# Patient Record
Sex: Male | Born: 1945 | Race: White | Hispanic: No | Smoking: Former smoker
Health system: Southern US, Community
[De-identification: ages and names within clinical notes are randomized; demographics above are authoritative.]

## PROBLEM LIST (undated history)

## (undated) DIAGNOSIS — C3490 Malignant neoplasm of unspecified part of unspecified bronchus or lung: Secondary | ICD-10-CM

## (undated) DIAGNOSIS — R32 Unspecified urinary incontinence: Secondary | ICD-10-CM

## (undated) DIAGNOSIS — I639 Cerebral infarction, unspecified: Secondary | ICD-10-CM

## (undated) DIAGNOSIS — I10 Essential (primary) hypertension: Secondary | ICD-10-CM

## (undated) DIAGNOSIS — R451 Restlessness and agitation: Secondary | ICD-10-CM

## (undated) DIAGNOSIS — T8859XA Other complications of anesthesia, initial encounter: Secondary | ICD-10-CM

## (undated) DIAGNOSIS — F17200 Nicotine dependence, unspecified, uncomplicated: Secondary | ICD-10-CM

## (undated) DIAGNOSIS — F0390 Unspecified dementia without behavioral disturbance: Secondary | ICD-10-CM

## (undated) DIAGNOSIS — T4145XA Adverse effect of unspecified anesthetic, initial encounter: Secondary | ICD-10-CM

## (undated) DIAGNOSIS — E785 Hyperlipidemia, unspecified: Secondary | ICD-10-CM

## (undated) HISTORY — DX: Restlessness and agitation: R45.1

## (undated) HISTORY — DX: Hyperlipidemia, unspecified: E78.5

## (undated) HISTORY — DX: Cerebral infarction, unspecified: I63.9

## (undated) HISTORY — DX: Malignant neoplasm of unspecified part of unspecified bronchus or lung: C34.90

## (undated) HISTORY — DX: Unspecified dementia without behavioral disturbance: F03.90

## (undated) HISTORY — DX: Essential (primary) hypertension: I10

## (undated) HISTORY — DX: Unspecified urinary incontinence: R32

---

## 2015-01-20 DIAGNOSIS — Z23 Encounter for immunization: Secondary | ICD-10-CM | POA: Diagnosis not present

## 2015-01-27 DIAGNOSIS — Z23 Encounter for immunization: Secondary | ICD-10-CM | POA: Diagnosis not present

## 2015-05-21 ENCOUNTER — Emergency Department (HOSPITAL_COMMUNITY): Payer: Medicare Other

## 2015-05-21 ENCOUNTER — Inpatient Hospital Stay (HOSPITAL_COMMUNITY)
Admission: EM | Admit: 2015-05-21 | Discharge: 2015-05-24 | DRG: 064 | Disposition: A | Discharge status: Home Health | Payer: Medicare Other | Attending: Internal Medicine | Admitting: Internal Medicine

## 2015-05-21 ENCOUNTER — Encounter (HOSPITAL_COMMUNITY): Payer: Self-pay | Admitting: *Deleted

## 2015-05-21 ENCOUNTER — Observation Stay (HOSPITAL_COMMUNITY): Payer: Medicare Other

## 2015-05-21 DIAGNOSIS — Z7982 Long term (current) use of aspirin: Secondary | ICD-10-CM

## 2015-05-21 DIAGNOSIS — G8191 Hemiplegia, unspecified affecting right dominant side: Secondary | ICD-10-CM | POA: Diagnosis not present

## 2015-05-21 DIAGNOSIS — R1310 Dysphagia, unspecified: Secondary | ICD-10-CM | POA: Diagnosis not present

## 2015-05-21 DIAGNOSIS — Z823 Family history of stroke: Secondary | ICD-10-CM

## 2015-05-21 DIAGNOSIS — Z8249 Family history of ischemic heart disease and other diseases of the circulatory system: Secondary | ICD-10-CM

## 2015-05-21 DIAGNOSIS — R29810 Facial weakness: Secondary | ICD-10-CM | POA: Diagnosis not present

## 2015-05-21 DIAGNOSIS — I69320 Aphasia following cerebral infarction: Secondary | ICD-10-CM | POA: Insufficient documentation

## 2015-05-21 DIAGNOSIS — G9341 Metabolic encephalopathy: Secondary | ICD-10-CM | POA: Diagnosis present

## 2015-05-21 DIAGNOSIS — R4781 Slurred speech: Secondary | ICD-10-CM | POA: Diagnosis not present

## 2015-05-21 DIAGNOSIS — I69359 Hemiplegia and hemiparesis following cerebral infarction affecting unspecified side: Secondary | ICD-10-CM

## 2015-05-21 DIAGNOSIS — R29711 NIHSS score 11: Secondary | ICD-10-CM | POA: Diagnosis present

## 2015-05-21 DIAGNOSIS — H0260 Xanthelasma of unspecified eye, unspecified eyelid: Secondary | ICD-10-CM | POA: Diagnosis present

## 2015-05-21 DIAGNOSIS — I63312 Cerebral infarction due to thrombosis of left middle cerebral artery: Secondary | ICD-10-CM | POA: Diagnosis not present

## 2015-05-21 DIAGNOSIS — R7303 Prediabetes: Secondary | ICD-10-CM | POA: Diagnosis present

## 2015-05-21 DIAGNOSIS — I639 Cerebral infarction, unspecified: Secondary | ICD-10-CM | POA: Diagnosis not present

## 2015-05-21 DIAGNOSIS — B9620 Unspecified Escherichia coli [E. coli] as the cause of diseases classified elsewhere: Secondary | ICD-10-CM | POA: Diagnosis present

## 2015-05-21 DIAGNOSIS — I69391 Dysphagia following cerebral infarction: Secondary | ICD-10-CM

## 2015-05-21 DIAGNOSIS — Z6835 Body mass index (BMI) 35.0-35.9, adult: Secondary | ICD-10-CM

## 2015-05-21 DIAGNOSIS — R4701 Aphasia: Secondary | ICD-10-CM | POA: Diagnosis not present

## 2015-05-21 DIAGNOSIS — M6281 Muscle weakness (generalized): Secondary | ICD-10-CM | POA: Diagnosis not present

## 2015-05-21 DIAGNOSIS — Z720 Tobacco use: Secondary | ICD-10-CM | POA: Insufficient documentation

## 2015-05-21 DIAGNOSIS — Z716 Tobacco abuse counseling: Secondary | ICD-10-CM

## 2015-05-21 DIAGNOSIS — Z833 Family history of diabetes mellitus: Secondary | ICD-10-CM

## 2015-05-21 DIAGNOSIS — I10 Essential (primary) hypertension: Secondary | ICD-10-CM | POA: Insufficient documentation

## 2015-05-21 DIAGNOSIS — F17210 Nicotine dependence, cigarettes, uncomplicated: Secondary | ICD-10-CM | POA: Diagnosis present

## 2015-05-21 DIAGNOSIS — I6523 Occlusion and stenosis of bilateral carotid arteries: Secondary | ICD-10-CM | POA: Diagnosis not present

## 2015-05-21 DIAGNOSIS — E785 Hyperlipidemia, unspecified: Secondary | ICD-10-CM | POA: Insufficient documentation

## 2015-05-21 DIAGNOSIS — R4182 Altered mental status, unspecified: Secondary | ICD-10-CM | POA: Diagnosis not present

## 2015-05-21 DIAGNOSIS — N390 Urinary tract infection, site not specified: Secondary | ICD-10-CM | POA: Insufficient documentation

## 2015-05-21 HISTORY — DX: Nicotine dependence, unspecified, uncomplicated: F17.200

## 2015-05-21 LAB — URINE MICROSCOPIC-ADD ON

## 2015-05-21 LAB — COMPREHENSIVE METABOLIC PANEL
ALBUMIN: 3.8 g/dL (ref 3.5–5.0)
ALT: 16 U/L — ABNORMAL LOW (ref 17–63)
ANION GAP: 13 (ref 5–15)
AST: 17 U/L (ref 15–41)
Alkaline Phosphatase: 56 U/L (ref 38–126)
BILIRUBIN TOTAL: 0.6 mg/dL (ref 0.3–1.2)
BUN: 14 mg/dL (ref 6–20)
CO2: 22 mmol/L (ref 22–32)
Calcium: 9.4 mg/dL (ref 8.9–10.3)
Chloride: 108 mmol/L (ref 101–111)
Creatinine, Ser: 1.06 mg/dL (ref 0.61–1.24)
GFR calc Af Amer: >60 mL/min (ref >60)
GLUCOSE: 118 mg/dL — AB (ref 65–99)
POTASSIUM: 4.2 mmol/L (ref 3.5–5.1)
Sodium: 143 mmol/L (ref 135–145)
TOTAL PROTEIN: 6.5 g/dL (ref 6.5–8.1)

## 2015-05-21 LAB — URINALYSIS, ROUTINE W REFLEX MICROSCOPIC
Bilirubin Urine: NEGATIVE
GLUCOSE, UA: NEGATIVE mg/dL
Ketones, ur: NEGATIVE mg/dL
Nitrite: POSITIVE — AB
PH: 6 (ref 5.0–8.0)
Protein, ur: NEGATIVE mg/dL
Specific Gravity, Urine: 1.005 (ref 1.005–1.030)

## 2015-05-21 LAB — DIFFERENTIAL
BASOS PCT: 0 %
Basophils Absolute: 0 10*3/uL (ref 0.0–0.1)
EOS ABS: 0.4 10*3/uL (ref 0.0–0.7)
EOS PCT: 4 %
LYMPHS ABS: 3.3 10*3/uL (ref 0.7–4.0)
Lymphocytes Relative: 35 %
Monocytes Absolute: 0.5 10*3/uL (ref 0.1–1.0)
Monocytes Relative: 5 %
NEUTROS PCT: 54 %
Neutro Abs: 5 10*3/uL (ref 1.7–7.7)

## 2015-05-21 LAB — RAPID URINE DRUG SCREEN, HOSP PERFORMED
Amphetamines: NOT DETECTED
Barbiturates: NOT DETECTED
Benzodiazepines: NOT DETECTED
COCAINE: NOT DETECTED
OPIATES: NOT DETECTED
Tetrahydrocannabinol: NOT DETECTED

## 2015-05-21 LAB — I-STAT CHEM 8, ED
BUN: 17 mg/dL (ref 6–20)
Calcium, Ion: 1.17 mmol/L (ref 1.13–1.30)
Chloride: 108 mmol/L (ref 101–111)
Creatinine, Ser: 0.9 mg/dL (ref 0.61–1.24)
Glucose, Bld: 115 mg/dL — ABNORMAL HIGH (ref 65–99)
HEMATOCRIT: 52 % (ref 39.0–52.0)
HEMOGLOBIN: 17.7 g/dL — AB (ref 13.0–17.0)
POTASSIUM: 4.2 mmol/L (ref 3.5–5.1)
SODIUM: 143 mmol/L (ref 135–145)
TCO2: 21 mmol/L (ref 0–100)

## 2015-05-21 LAB — CBC
HCT: 49.1 % (ref 39.0–52.0)
HEMOGLOBIN: 16.4 g/dL (ref 13.0–17.0)
MCH: 30.5 pg (ref 26.0–34.0)
MCHC: 33.4 g/dL (ref 30.0–36.0)
MCV: 91.3 fL (ref 78.0–100.0)
PLATELETS: 181 10*3/uL (ref 150–400)
RBC: 5.38 MIL/uL (ref 4.22–5.81)
RDW: 13.9 % (ref 11.5–15.5)
WBC: 9.3 10*3/uL (ref 4.0–10.5)

## 2015-05-21 LAB — APTT: aPTT: 28 seconds (ref 24–37)

## 2015-05-21 LAB — PROTIME-INR
INR: 1.05 (ref 0.00–1.49)
PROTHROMBIN TIME: 13.9 s (ref 11.6–15.2)

## 2015-05-21 LAB — ETHANOL

## 2015-05-21 LAB — GLUCOSE, CAPILLARY
GLUCOSE-CAPILLARY: 87 mg/dL (ref 65–99)
Glucose-Capillary: 81 mg/dL (ref 65–99)

## 2015-05-21 LAB — I-STAT TROPONIN, ED: TROPONIN I, POC: 0 ng/mL (ref 0.00–0.08)

## 2015-05-21 MED ORDER — ASPIRIN 300 MG RE SUPP: 300.0000 mg | Freq: Every day | RECTAL | Status: DC

## 2015-05-21 MED ORDER — ASPIRIN 325 MG PO TABS
325.0000 mg | ORAL_TABLET | Freq: Every day | ORAL | Status: DC
  Administered 2015-05-22 – 2015-05-24 (×3): 325 mg via ORAL
  Filled 2015-05-21 (×3): qty 1

## 2015-05-21 MED ORDER — ENOXAPARIN SODIUM 40 MG/0.4ML ~~LOC~~ SOLN
40.0000 mg | SUBCUTANEOUS | Status: DC
  Administered 2015-05-21 – 2015-05-23 (×3): 40 mg via SUBCUTANEOUS
  Filled 2015-05-21 (×3): qty 0.4

## 2015-05-21 MED ORDER — DEXTROSE-NACL 5-0.45 % IV SOLN
INTRAVENOUS | Status: AC
  Administered 2015-05-21: 17:00:00 via INTRAVENOUS

## 2015-05-21 MED ORDER — IOHEXOL 350 MG/ML SOLN
80.0000 mL | Freq: Once | INTRAVENOUS | Status: AC | PRN
  Administered 2015-05-21: 80 mL via INTRAVENOUS

## 2015-05-21 MED ORDER — STROKE: EARLY STAGES OF RECOVERY BOOK
Freq: Once | Status: AC
  Administered 2015-05-21: 15:00:00
  Filled 2015-05-21: qty 1

## 2015-05-21 MED ORDER — CETYLPYRIDINIUM CHLORIDE 0.05 % MT LIQD
7.0000 mL | Freq: Two times a day (BID) | OROMUCOSAL | Status: DC
  Administered 2015-05-21 – 2015-05-24 (×6): 7 mL via OROMUCOSAL

## 2015-05-21 MED ORDER — NICOTINE 21 MG/24HR TD PT24
21.0000 mg | MEDICATED_PATCH | Freq: Every day | TRANSDERMAL | Status: DC
  Administered 2015-05-21 – 2015-05-24 (×4): 21 mg via TRANSDERMAL
  Filled 2015-05-21 (×4): qty 1

## 2015-05-21 MED ORDER — CHLORHEXIDINE GLUCONATE 0.12 % MT SOLN
15.0000 mL | Freq: Two times a day (BID) | OROMUCOSAL | Status: DC
  Administered 2015-05-21 – 2015-05-24 (×6): 15 mL via OROMUCOSAL
  Filled 2015-05-21 (×6): qty 15

## 2015-05-21 NOTE — ED Notes (Signed)
Deferred code stroke consult. LSN yesterday.

## 2015-05-21 NOTE — ED Notes (Signed)
Pt arrives from home via [employer]. Pt was LSN yesterday at 1630. Pt is able to follow only simple commands. Pt is having expressive aphasia, right sided weakness with a right sided facial droop. Pt seems confused and is fidgety.

## 2015-05-21 NOTE — Consult Note (Signed)
Requesting Physician: Dr. Dayna Barker    Chief Complaint: CVA  HPI:                                                                                                                                         Jonathan Bowen is an 70 y.o. male who does smoke "alot per family" but has not seen a MD and thus has no PMHx. Per family cholesterol and DM are in the family. Yesterday he was noted to not speak to the son and seemed not to be able to recognize him. This AM daughter noted he again was not speaking and seemed confused. Due to her noting a facial droop and some weakness on the right side he was brought to ED. CT head confirmed a left insular cortex wedge shape hypodensity. Currently he is both expressive and receptive aphasic. HE has a right facial droop and right sided weakness.  Per family he did take a ASA daily.  Date last known well: 2.2.2017 Time last known well:Time: 16:30 tPA Given: no out of window     Past Medical History  Diagnosis Date  . Smoker     History reviewed. No pertinent past surgical history.  Family History  Problem Relation Age of Onset  . Diabetes Mother   . Diabetes Father   . Hypertension Mother   . Hypertension Father   . Hyperlipidemia Father   . Hyperlipidemia Mother    Social History:  reports that he has been smoking.  He does not have any smokeless tobacco history on file. He reports that he does not drink alcohol or use illicit drugs.  Allergies: No Known Allergies  Medications:                                                                                                                           No current facility-administered medications for this encounter.   Current Outpatient Prescriptions  Medication Sig Dispense Refill  . aspirin EC 81 MG tablet Take 81 mg by mouth daily.       ROS:  History obtained from  family  General ROS: negative for - chills, fatigue, fever, night sweats, weight gain or weight loss Psychological ROS: negative for - behavioral disorder, hallucinations, memory difficulties, mood swings or suicidal ideation Ophthalmic ROS: negative for - blurry vision, double vision, eye pain or loss of vision ENT ROS: negative for - epistaxis, nasal discharge, oral lesions, sore throat, tinnitus or vertigo Allergy and Immunology ROS: negative for - hives or itchy/watery eyes Hematological and Lymphatic ROS: negative for - bleeding problems, bruising or swollen lymph nodes Endocrine ROS: negative for - galactorrhea, hair pattern changes, polydipsia/polyuria or temperature intolerance Respiratory ROS: negative for - cough, hemoptysis, shortness of breath or wheezing Cardiovascular ROS: negative for - chest pain, dyspnea on exertion, edema or irregular heartbeat Gastrointestinal ROS: negative for - abdominal pain, diarrhea, hematemesis, nausea/vomiting or stool incontinence Genito-Urinary ROS: negative for - dysuria, hematuria, incontinence or urinary frequency/urgency Musculoskeletal ROS: negative for - joint swelling or muscular weakness Neurological ROS: as noted in HPI Dermatological ROS: negative for rash and skin lesion changes  Neurologic Examination:                                                                                                      Blood pressure 196/76, pulse 71, temperature 98.3 F (36.8 C), temperature source Oral, resp. rate 16, height '5\' 6"'$  (1.676 m), weight 113.399 kg (250 lb), SpO2 90 %.  HEENT-  Normocephalic, no lesions, without obvious abnormality.  Normal external eye and conjunctiva.  Normal TM's bilaterally.  Normal auditory canals and external ears. Normal external nose, mucus membranes and septum.  Normal pharynx. Cardiovascular- S1, S2 normal, pulses palpable throughout   Lungs- chest clear, no wheezing, rales, normal symmetric air entry Abdomen-  normal findings: bowel sounds normal Extremities- no edema Lymph-no adenopathy palpable Musculoskeletal-no joint tenderness, deformity or swelling Skin-warm and dry, no hyperpigmentation, vitiligo, or suspicious lesions  Neurological Examination Mental Status: Alert, receptive and expressive aphasia. Has difficulty mimicking visual commands.  Cranial Nerves: II: Discs flat bilaterally; Visual fields grossly normal, pupils equal, round, reactive to light and accommodation III,IV, VI: ptosis not present, extra-ocular motions intact bilaterally V,VII: smile asymmetric on the right no over decreased sensation VIII: hearing normal bilaterally IX,X: uvula rises symmetrically XI: bilateral shoulder shrug XII: midline tongue extension Motor: Right : Upper extremity   4/5    Left:     Upper extremity   5/5  Lower extremity   4/5     Lower extremity   5/5 Tone and bulk:normal tone throughout; no atrophy noted Sensory: withdraws to pain bilaterally Deep Tendon Reflexes: 2+ and symmetric throughout Plantars: Right: downgoing   Left: downgoing Cerebellar: normal finger-to-nose, Gait: not tested       Lab Results: Basic Metabolic Panel:  Recent Labs Lab 05/21/15 0918 05/21/15 0928  NA 143 143  K 4.2 4.2  CL 108 108  CO2 22  --   GLUCOSE 118* 115*  BUN 14 17  CREATININE 1.06 0.90  CALCIUM 9.4  --     Liver Function Tests:  Recent Labs Lab 05/21/15 [phone]  AST 17  ALT 16*  ALKPHOS 56  BILITOT 0.6  PROT 6.5  ALBUMIN 3.8   No results for input(s): LIPASE, AMYLASE in the last 168 hours. No results for input(s): AMMONIA in the last 168 hours.  CBC:  Recent Labs Lab 05/21/15 0918 05/21/15 0928  WBC 9.3  --   NEUTROABS 5.0  --   HGB 16.4 17.7*  HCT 49.1 52.0  MCV 91.3  --   PLT 181  --     Cardiac Enzymes: No results for input(s): CKTOTAL, CKMB, CKMBINDEX, TROPONINI in the last 168 hours.  Lipid Panel: No results for input(s): CHOL, TRIG, HDL, CHOLHDL, VLDL,  LDLCALC in the last 168 hours.  CBG: No results for input(s): GLUCAP in the last 168 hours.  Microbiology: No results found for this or any previous visit.  Coagulation Studies:  Recent Labs  05/21/15 0918  LABPROT 13.9  INR 1.05    Imaging: Ct Head Wo Contrast  05/21/2015  CLINICAL DATA:  Altered mental status, nonverbal EXAM: CT HEAD WITHOUT CONTRAST TECHNIQUE: Contiguous axial images were obtained from the base of the skull through the vertex without contrast. COMPARISON:  None FINDINGS: Ill-defined wedge-shaped hypodensity involving the left insular cortex, external capsule, basal ganglia, and posterior limb of the left internal capsule. This extends into the coronal radiata/periventricular white matter, image 18. Appearance is compatible with an acute to subacute infarct. No associated hemorrhage, significant mass effect or edema. No midline shift. Ventricles remain symmetric. No hydrocephalus. Diffuse age-related brain atrophy. No extra-axial fluid collection. Cisterns remain patent. Cerebellar atrophy as well. Orbits are symmetric. Skull appears intact. Mastoids are clear. Retention cyst in the left maxillary sinus inferiorly measuring 15 mm. Similar small retention cyst in the left frontal sinus. Other sinuses remain clear. IMPRESSION: Wedge-shaped hypodensity involving the left insular cortex, external capsule, adjacent basal ganglia, and posterior limb of the internal capsule extending into the deep white matter compatible with an acute to subacute infarct. No associated intracranial hemorrhage, mass effect, or midline shift. Age related brain atrophy These results were called by telephone at the time of interpretation on 05/21/2015 at 10:33 am to [facility], [facility] , who verbally acknowledged these results. Electronically Signed   By: Jerilynn Mages.  Shick M.D.   On: 05/21/2015 10:35    Assessment and plan discussed with with attending physician and they are in agreement.    Etta Quill PA-C [facility] [phone]  05/21/2015, 11:43 AM   Assessment: 70 y.o. male with subacute infarct, possibly embolic or large vessel athero. He is being admitted for further evaluation.   Stroke Risk Factors - smoking  1. HgbA1c, fasting lipid panel 2. MRI of the brain without contrast 3. Frequent neuro checks 4. Echocardiogram 5. CT angio head and neck.  6. Prophylactic therapy-Antiplatelet med: Aspirin - dose '325mg'$  PO or '300mg'$  PR 7. Risk factor modification 8. Telemetry monitoring 9. PT consult, OT consult, Speech consult 10. please page stroke NP  Or  PA  Or MD  M-F from 8am -4 pm starting 05/22/15 as this patient will be followed by the stroke team at this point.   You can look them up on [email or web address]  Password [email or web address]  Roland Rack, MD [facility] [phone]  If 7pm- 7am, please page neurology on call as listed in [place].

## 2015-05-21 NOTE — Progress Notes (Signed)
Patient's BP 195/74 and rechecked with a result of 196/70.  MD notified and stated hypertention is permitted with stroke protocol unless SBP is greater than 220.

## 2015-05-21 NOTE — H&P (Signed)
Date: 05/21/2015               Patient Name:  Jonathan Bowen MRN: [id number]  DOB: [birth year] Age / Sex: 70 y.o., male   PCP: No primary care provider on file.         Medical Service: Internal Medicine Teaching Service         Attending Physician: Dr. Oval Linsey, MD    First Contact: Dr. Jule Ser Pager: [phone]  Second Contact: Dr. Charlott Rakes Pager: [phone]       After Hours (After 5p/  First Contact Pager: [phone]  weekends / holidays): Second Contact Pager: [phone]   Chief Complaint: stroke  History of Present Illness: Mr. Maxie Slovacek is a 70 y.o. man with past medical history of tobacco abuse who presented to the emergency department due to altered mental status.  Patient was unable to contribute to the HPI due to his current medical condition, but the patients daughter was present and able to provide the history.  Patient currently with expressive and receptive aphasia that daughter reports seems improved.  He is unable to provide an answer to orientation questions, unable to comprehend how many quarters are in a dollar, but he is able to repeat back "no if's and or but's" without slurred speech.  Daughter reports that her father was last seen normal last night by patients son, with whom he lives.  Apparently, patient was unable to recognize son and was not speaking as much.  This morning, patient did not answer his son when spoken to.  When daughter arrived to the house, she noticed patient was not speaking sensically, his speech was slurred, he was confused and crying, was sitting naked on the bed, and had a right facial droop with right sided weakness.  She then part him to the Emergency Department for further evaluation.  Patient takes no daily medications except Aspirin '81mg'$  for primary prevention.  Past Medical History: 150-year smoking history (3 PPD x 50 years), no other past history as patient does not have PCP and has not been seen by doctor in years  Social  History: smokes 3 PPD x 50 years, no EtOH, no ellicit drugs, divorced, lives with adult son, has 2 adult children, retired Electrical engineer, poor diet per daughter  Family History: significant for HTN, DM, HLD, CVA.    Meds: No current facility-administered medications for this encounter.   Current Outpatient Prescriptions  Medication Sig Dispense Refill  . aspirin EC 81 MG tablet Take 81 mg by mouth daily.      Allergies: Allergies as of 05/21/2015  . (No Known Allergies)   Past Medical History  Diagnosis Date  . Smoker    History reviewed. No pertinent past surgical history. Family History  Problem Relation Age of Onset  . Diabetes Mother   . Diabetes Father   . Hypertension Mother   . Hypertension Father   . Hyperlipidemia Father   . Hyperlipidemia Mother    Social History   Social History  . Marital Status: Unknown    Spouse Name: N/A  . Number of Children: N/A  . Years of Education: N/A   Occupational History  . Not on file.   Social History Main Topics  . Smoking status: Current Every Day Smoker -- 3.00 packs/day  . Smokeless tobacco: Not on file  . Alcohol Use: No  . Drug Use: No  . Sexual Activity: Not Currently   Other Topics Concern  . Not on file  Social History Narrative  . No narrative on file    Review of Systems: Review of Systems  Unable to perform ROS: medical condition     Physical Exam: Blood pressure 188/77, pulse 71, temperature 98.3 F (36.8 C), temperature source Oral, resp. rate 20, height '5\' 6"'$  (1.676 m), weight 250 lb (113.399 kg), SpO2 99 %. Physical Exam  Constitutional:  Moderately obese male, lying in bed, no distress  HENT:  Head: Normocephalic and atraumatic.  Eyes: EOM are normal.  Xanthelasma present  Neck: Normal range of motion.  Cardiovascular: Normal rate, regular rhythm, normal heart sounds and intact distal pulses.   No murmur heard. Pulmonary/Chest: Effort normal. He has wheezes.  Abdominal:  Soft. Bowel sounds are normal. He exhibits no distension. There is no tenderness.  Neurological:  RIGHT facial droop, expressive and receptive aphasia. 4/5 RUE strength, 5/5 LUE strength. 4/5 RLE strength, 5/5 LLE strength. Normal finger-to-nose, normal heel-to-shin testing. CN II-XII grossly intact although has some difficulty following commands due to comprehension.  Skin:  Multiple excoriations of bilateral upper extremities that family reports are from working outside     Lab results: Basic Metabolic Panel:  Recent Labs  05/21/15 0918 05/21/15 0928  NA 143 143  K 4.2 4.2  CL 108 108  CO2 22  --   GLUCOSE 118* 115*  BUN 14 17  CREATININE 1.06 0.90  CALCIUM 9.4  --    Liver Function Tests:  Recent Labs  05/21/15 0918  AST 17  ALT 16*  ALKPHOS 56  BILITOT 0.6  PROT 6.5  ALBUMIN 3.8   No results for input(s): LIPASE, AMYLASE in the last 72 hours. No results for input(s): AMMONIA in the last 72 hours. CBC:  Recent Labs  05/21/15 0918 05/21/15 0928  WBC 9.3  --   NEUTROABS 5.0  --   HGB 16.4 17.7*  HCT 49.1 52.0  MCV 91.3  --   PLT 181  --    Cardiac Enzymes: No results for input(s): CKTOTAL, CKMB, CKMBINDEX, TROPONINI in the last 72 hours. BNP: No results for input(s): PROBNP in the last 72 hours. D-Dimer: No results for input(s): DDIMER in the last 72 hours. CBG: No results for input(s): GLUCAP in the last 72 hours. Hemoglobin A1C: No results for input(s): HGBA1C in the last 72 hours. Fasting Lipid Panel: No results for input(s): CHOL, HDL, LDLCALC, TRIG, CHOLHDL, LDLDIRECT in the last 72 hours. Thyroid Function Tests: No results for input(s): TSH, T4TOTAL, FREET4, T3FREE, THYROIDAB in the last 72 hours. Anemia Panel: No results for input(s): VITAMINB12, FOLATE, FERRITIN, TIBC, IRON, RETICCTPCT in the last 72 hours. Coagulation:  Recent Labs  05/21/15 0918  LABPROT 13.9  INR 1.05   Urine Drug Screen: Drugs of Abuse  No results found  for: LABOPIA, COCAINSCRNUR, LABBENZ, AMPHETMU, THCU, LABBARB  Alcohol Level:  Recent Labs  05/21/15 1002  ETH <5   Urinalysis: No results for input(s): COLORURINE, LABSPEC, PHURINE, GLUCOSEU, HGBUR, BILIRUBINUR, KETONESUR, PROTEINUR, UROBILINOGEN, NITRITE, LEUKOCYTESUR in the last 72 hours.  Invalid input(s): APPERANCEUR  Imaging results:  Ct Head Wo Contrast  05/21/2015  CLINICAL DATA:  Altered mental status, nonverbal EXAM: CT HEAD WITHOUT CONTRAST TECHNIQUE: Contiguous axial images were obtained from the base of the skull through the vertex without contrast. COMPARISON:  None FINDINGS: Ill-defined wedge-shaped hypodensity involving the left insular cortex, external capsule, basal ganglia, and posterior limb of the left internal capsule. This extends into the coronal radiata/periventricular white matter, image 18. Appearance is compatible with  an acute to subacute infarct. No associated hemorrhage, significant mass effect or edema. No midline shift. Ventricles remain symmetric. No hydrocephalus. Diffuse age-related brain atrophy. No extra-axial fluid collection. Cisterns remain patent. Cerebellar atrophy as well. Orbits are symmetric. Skull appears intact. Mastoids are clear. Retention cyst in the left maxillary sinus inferiorly measuring 15 mm. Similar small retention cyst in the left frontal sinus. Other sinuses remain clear. IMPRESSION: Wedge-shaped hypodensity involving the left insular cortex, external capsule, adjacent basal ganglia, and posterior limb of the internal capsule extending into the deep white matter compatible with an acute to subacute infarct. No associated intracranial hemorrhage, mass effect, or midline shift. Age related brain atrophy These results were called by telephone at the time of interpretation on 05/21/2015 at 10:33 am to [facility], [facility] , who verbally acknowledged these results. Electronically Signed   By: Jerilynn Mages.  Shick M.D.   On: 05/21/2015 10:35    Other results: EKG:  sinus rhythm  Assessment & Plan by Problem: Active Problems:   CVA (cerebral infarction)   Acute CVA (cerebrovascular accident) [facility])  Patient presenting from home with expressive and receptive aphasia, right facial droop, confusion with onset last evening.  Significant medical history for tobacco abuse.  In the ED, CT scan revealed a wedge-shaped hypodensity involving the left insular cortex, external capsule, adjacent basal ganglia, and posterior limb of the internal capsule extending into the deep white matter compatible with an acute to subacute infarct.  There was no intracranial hemorrhage, mass effect, or midline shift.  tPA not given due to presentation outside of appropriate treatment window.  EKG did not show any acute ischemic changes.  CMP unremarkable except for a glucose of 118.  CBC completely normal.  Troponin normal, ethanol normal.   Cerebrovascular Accident: risk factors include tobacco abuse history, likely has elevated cholesterol based on presence of xanthelasma.  No prior history of similar episodes, no history of diabetes, HTN, but has not been involved with regular health care. - Neurology following, appreciate recs - Aspirin '325mg'$  daily - Allow for permissive HTN  - Urinalysis and UDS pending - TTE - MRI brain without contrast - CT angio of head and neck - PT, OT, SLP evaluations - Lipid panel  - A1C - Frequent neuro checks - Monitor on telemetry - NPO   Tobacco Abuse: significant history of smoking 3 PPD x 50 years - Nicotine patch - Smoking cessation  FEN Fluids: D5-1/2NS at 105m/hr x 12 hrs while NPO Electrolytes: replace if needed Nutrition: NPO  DVT PPx: Lovenox  CODE: Full  Dispo: Disposition is deferred at this time, awaiting improvement of current medical problems.   The patient does not have a current PCP (No primary care provider on file.) and does need [facility] follow-up appointment after discharge.  The patient does have  transportation limitations that hinder transportation to clinic appointments.  Signed: AJule Ser DO 05/21/2015, 2:31 PM

## 2015-05-21 NOTE — ED Provider Notes (Signed)
CSN: [id number]     Arrival date & time 05/21/15  0909 History   First MD Initiated Contact with Patient 05/21/15 [phone]     Chief Complaint  Patient presents with  . Cerebrovascular Accident   (Consider location/radiation/quality/duration/timing/severity/associated sxs/prior Treatment) HPI 70 y.o. male presents to the Emergency Department today by [facility] for stroke-like symptoms. Last seen normal around 1630 yesterday. Per EMS, there is expressive aphasia, right sided weakness, right sided facial droop. CBG- 109. BP 160/92. Upon talking with the patient's son, he was speaking normally around 1630 yesterday. Has never seen a doctor. No medical hx. Only takes a baby aspirin daily.     History reviewed. No pertinent past medical history. History reviewed. No pertinent past surgical history. History reviewed. No pertinent family history. Social History  Substance Use Topics  . Smoking status: Current Every Day Smoker -- 3.00 packs/day  . Smokeless tobacco: None  . Alcohol Use: No    Review of Systems  Unable to perform ROS: Mental status change  Expressive Aphasia   Allergies  Review of patient's allergies indicates no known allergies.  Home Medications   Prior to Admission medications   Not on File   BP 164/113 mmHg  Pulse 75  Temp(Src) 98.3 F (36.8 C) (Oral)  Resp 24  Ht '5\' 6"'$  (1.676 m)  Wt 113.399 kg  BMI 40.37 kg/m2  SpO2 98%   Physical Exam  Constitutional: He appears well-developed and well-nourished.  HENT:  Head: Normocephalic and atraumatic.  Eyes: EOM are normal. Pupils are equal, round, and reactive to light.  Neck: Normal range of motion. Neck supple.  Cardiovascular: Normal rate, regular rhythm and normal heart sounds.   Pulmonary/Chest: Effort normal and breath sounds normal.  Abdominal: Soft. Bowel sounds are normal. There is no tenderness.  Musculoskeletal: Normal range of motion.  Neurological: He is alert. He has normal strength.  NIH Stroke  11 Expressive Aphasia  Pt appears confused and agitated on exam  Skin: Skin is warm and dry.  Psychiatric: He has a normal mood and affect. His behavior is normal. Thought content normal.  Nursing note and vitals reviewed.  ED Course  Procedures (including critical care time) Labs Review Labs Reviewed  COMPREHENSIVE METABOLIC PANEL - Abnormal; Notable for the following:    Glucose, Bld 118 (*)    ALT 16 (*)    All other components within normal limits  I-STAT CHEM 8, ED - Abnormal; Notable for the following:    Glucose, Bld 115 (*)    Hemoglobin 17.7 (*)    All other components within normal limits  ETHANOL  PROTIME-INR  APTT  CBC  DIFFERENTIAL  URINE RAPID DRUG SCREEN, HOSP PERFORMED  URINALYSIS, ROUTINE W REFLEX MICROSCOPIC (NOT AT [facility])  I-STAT TROPOININ, ED   Imaging Review Ct Head Wo Contrast  05/21/2015  CLINICAL DATA:  Altered mental status, nonverbal EXAM: CT HEAD WITHOUT CONTRAST TECHNIQUE: Contiguous axial images were obtained from the base of the skull through the vertex without contrast. COMPARISON:  None FINDINGS: Ill-defined wedge-shaped hypodensity involving the left insular cortex, external capsule, basal ganglia, and posterior limb of the left internal capsule. This extends into the coronal radiata/periventricular white matter, image 18. Appearance is compatible with an acute to subacute infarct. No associated hemorrhage, significant mass effect or edema. No midline shift. Ventricles remain symmetric. No hydrocephalus. Diffuse age-related brain atrophy. No extra-axial fluid collection. Cisterns remain patent. Cerebellar atrophy as well. Orbits are symmetric. Skull appears intact. Mastoids are clear. Retention cyst  in the left maxillary sinus inferiorly measuring 15 mm. Similar small retention cyst in the left frontal sinus. Other sinuses remain clear. IMPRESSION: Wedge-shaped hypodensity involving the left insular cortex, external capsule, adjacent basal ganglia, and  posterior limb of the internal capsule extending into the deep white matter compatible with an acute to subacute infarct. No associated intracranial hemorrhage, mass effect, or midline shift. Age related brain atrophy These results were called by telephone at the time of interpretation on 05/21/2015 at 10:33 am to [facility], [facility] , who verbally acknowledged these results. Electronically Signed   By: Jerilynn Mages.  Shick M.D.   On: 05/21/2015 10:35   I have personally reviewed and evaluated these images and lab results as part of my medical decision-making.   EKG Interpretation   Date/Time:  Friday May 21 2015 09:14:16 EST Ventricular Rate:  81 PR Interval:  144 QRS Duration: 103 QT Interval:  402 QTC Calculation: 467 R Axis:   79 Text Interpretation:  Sinus rhythm RSR' in V1 or V2, right VCD or RVH  Confirmed by [facility] MD, Corene Cornea [phone]) on 05/21/2015 9:17:58 AM      MDM  I have reviewed relevant laboratory values.I have reviewed relevant imaging studies.I personally interpreted the relevant EKG.I have reviewed the relevant previous healthcare records.I have reviewed EMS Documentation.I obtained HPI from historian. Patient discussed with supervising physician  ED Course:  Assessment: 87y M with no sig pmh presents with CVA. Last seen normal 4:40pm yesterday. Presented with expressive aphasia and right facial droop. Son stated this was off from baseline. CT showed acute/subacute infarcts. No hemorrhage/mass effect noted. On exam, pt is in NAD. VSS. Labs unremarkable. Appears confused and agitated. Pt is able to understand commands, but unable to communicate. Will admit to medicine with neuro to follow.    Disposition/Plan:  Admit Pt acknowledges and agrees with plan  Supervising Physician Merrily Pew, MD   Final diagnoses:  Cerebral infarction due to unspecified mechanism       Shary Decamp, PA-C 05/21/15 [place], MD 05/22/15 1045

## 2015-05-22 ENCOUNTER — Observation Stay (HOSPITAL_COMMUNITY): Payer: Medicare Other

## 2015-05-22 DIAGNOSIS — Z7982 Long term (current) use of aspirin: Secondary | ICD-10-CM | POA: Diagnosis not present

## 2015-05-22 DIAGNOSIS — I10 Essential (primary) hypertension: Secondary | ICD-10-CM | POA: Diagnosis not present

## 2015-05-22 DIAGNOSIS — R1310 Dysphagia, unspecified: Secondary | ICD-10-CM | POA: Diagnosis present

## 2015-05-22 DIAGNOSIS — Z716 Tobacco abuse counseling: Secondary | ICD-10-CM | POA: Diagnosis not present

## 2015-05-22 DIAGNOSIS — F17210 Nicotine dependence, cigarettes, uncomplicated: Secondary | ICD-10-CM | POA: Diagnosis present

## 2015-05-22 DIAGNOSIS — Z720 Tobacco use: Secondary | ICD-10-CM | POA: Diagnosis not present

## 2015-05-22 DIAGNOSIS — E785 Hyperlipidemia, unspecified: Secondary | ICD-10-CM | POA: Insufficient documentation

## 2015-05-22 DIAGNOSIS — G8191 Hemiplegia, unspecified affecting right dominant side: Secondary | ICD-10-CM | POA: Diagnosis not present

## 2015-05-22 DIAGNOSIS — I638 Other cerebral infarction: Secondary | ICD-10-CM

## 2015-05-22 DIAGNOSIS — Z833 Family history of diabetes mellitus: Secondary | ICD-10-CM | POA: Diagnosis not present

## 2015-05-22 DIAGNOSIS — R4701 Aphasia: Secondary | ICD-10-CM | POA: Diagnosis not present

## 2015-05-22 DIAGNOSIS — I69392 Facial weakness following cerebral infarction: Secondary | ICD-10-CM

## 2015-05-22 DIAGNOSIS — R29810 Facial weakness: Secondary | ICD-10-CM | POA: Diagnosis present

## 2015-05-22 DIAGNOSIS — B9620 Unspecified Escherichia coli [E. coli] as the cause of diseases classified elsewhere: Secondary | ICD-10-CM | POA: Diagnosis present

## 2015-05-22 DIAGNOSIS — I6521 Occlusion and stenosis of right carotid artery: Secondary | ICD-10-CM | POA: Diagnosis not present

## 2015-05-22 DIAGNOSIS — I639 Cerebral infarction, unspecified: Secondary | ICD-10-CM | POA: Diagnosis not present

## 2015-05-22 DIAGNOSIS — R4182 Altered mental status, unspecified: Secondary | ICD-10-CM | POA: Diagnosis not present

## 2015-05-22 DIAGNOSIS — N390 Urinary tract infection, site not specified: Secondary | ICD-10-CM | POA: Diagnosis present

## 2015-05-22 DIAGNOSIS — Z6835 Body mass index (BMI) 35.0-35.9, adult: Secondary | ICD-10-CM | POA: Diagnosis not present

## 2015-05-22 DIAGNOSIS — Z8249 Family history of ischemic heart disease and other diseases of the circulatory system: Secondary | ICD-10-CM | POA: Diagnosis not present

## 2015-05-22 DIAGNOSIS — R7303 Prediabetes: Secondary | ICD-10-CM | POA: Diagnosis present

## 2015-05-22 DIAGNOSIS — I63312 Cerebral infarction due to thrombosis of left middle cerebral artery: Secondary | ICD-10-CM | POA: Diagnosis present

## 2015-05-22 DIAGNOSIS — G9341 Metabolic encephalopathy: Secondary | ICD-10-CM | POA: Diagnosis present

## 2015-05-22 DIAGNOSIS — R29711 NIHSS score 11: Secondary | ICD-10-CM | POA: Diagnosis present

## 2015-05-22 DIAGNOSIS — I6789 Other cerebrovascular disease: Secondary | ICD-10-CM | POA: Diagnosis not present

## 2015-05-22 DIAGNOSIS — H0260 Xanthelasma of unspecified eye, unspecified eyelid: Secondary | ICD-10-CM | POA: Diagnosis present

## 2015-05-22 DIAGNOSIS — Z823 Family history of stroke: Secondary | ICD-10-CM | POA: Diagnosis not present

## 2015-05-22 LAB — GLUCOSE, CAPILLARY
GLUCOSE-CAPILLARY: 97 mg/dL (ref 65–99)
Glucose-Capillary: 120 mg/dL — ABNORMAL HIGH (ref 65–99)
Glucose-Capillary: 107 mg/dL — ABNORMAL HIGH (ref 65–99)

## 2015-05-22 LAB — LIPID PANEL
CHOL/HDL RATIO: 7.3 ratio
Cholesterol: 191 mg/dL (ref 0–200)
HDL: 26 mg/dL — AB (ref >40)
LDL Cholesterol: 136 mg/dL — ABNORMAL HIGH (ref 0–99)
TRIGLYCERIDES: 143 mg/dL (ref <150)
VLDL: 29 mg/dL (ref 0–40)

## 2015-05-22 MED ORDER — DEXTROSE 5 % IV SOLN
1.0000 g | INTRAVENOUS | Status: DC
  Administered 2015-05-22 – 2015-05-24 (×3): 1 g via INTRAVENOUS
  Filled 2015-05-22 (×4): qty 10

## 2015-05-22 MED ORDER — DEXTROSE-NACL 5-0.45 % IV SOLN: INTRAVENOUS | Status: DC

## 2015-05-22 MED ORDER — RESOURCE THICKENUP CLEAR PO POWD
ORAL | Status: DC | PRN
  Filled 2015-05-22 (×2): qty 125

## 2015-05-22 MED ORDER — ATORVASTATIN CALCIUM 80 MG PO TABS
80.0000 mg | ORAL_TABLET | Freq: Every day | ORAL | Status: DC
  Administered 2015-05-22 – 2015-05-23 (×2): 80 mg via ORAL
  Filled 2015-05-22 (×2): qty 1

## 2015-05-22 MED ORDER — LORAZEPAM 2 MG/ML IJ SOLN
1.0000 mg | Freq: Once | INTRAMUSCULAR | Status: AC
  Administered 2015-05-22: 1 mg via INTRAVENOUS
  Filled 2015-05-22: qty 1

## 2015-05-22 MED ORDER — RISPERIDONE 0.5 MG PO TABS
0.5000 mg | ORAL_TABLET | Freq: Every day | ORAL | Status: DC
  Administered 2015-05-22 – 2015-05-23 (×2): 0.5 mg via ORAL
  Filled 2015-05-22 (×2): qty 1

## 2015-05-22 NOTE — Evaluation (Signed)
Clinical/Bedside Swallow Evaluation Patient Details  Name: Jonathan Bowen MRN: [id number] Date of Birth: February 12, 1946  Today's Date: 05/22/2015 Time: SLP Start Time (ACUTE ONLY): 2505 SLP Stop Time (ACUTE ONLY): 0916 SLP Time Calculation (min) (ACUTE ONLY): 18 min  Past Medical History:  Past Medical History  Diagnosis Date  . Smoker    Past Surgical History: History reviewed. No pertinent past surgical history. HPI:  70 y.o. 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 No CXR or prior ST notes.    Assessment / Plan / Recommendation Clinical Impression  Pt presents with global aphasia with greater expressive language impairment. Verbal expression is limited to spontaneous utterances (greetings and "I don't know" etc), difficulty repeating with visual cues. 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 Cognitive deficits in the area of selt control and monitoring with significant impulsivity. Pt would benefit from continued ST and inpatient rehab stay.    Aspiration Risk  Moderate aspiration risk    Diet Recommendation Dysphagia 1 (Puree);Nectar-thick liquid   Liquid Administration via: Cup;No straw Medication Administration: Crushed with puree Supervision: Full supervision/cueing for compensatory strategies;Patient able to self feed Compensations: Minimize environmental distractions;Slow rate;Small sips/bites Postural Changes: Seated upright at 90 degrees    Other  Recommendations Oral Care Recommendations: Oral care BID Other Recommendations: Order thickener from pharmacy   Follow up Recommendations  Inpatient Rehab    Frequency and Duration min 2x/week           Prognosis        Swallow Study   General HPI: 70 y.o. 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 No CXR or prior ST notes.  Type of Study: Bedside Swallow Evaluation Previous Swallow Assessment:  (none) Diet Prior to this Study: NPO Temperature Spikes Noted: No Respiratory Status: Room air History of Recent Intubation: No Behavior/Cognition: Alert;Cooperative;Impulsive;Requires cueing Oral Cavity Assessment: Within Functional Limits Oral Care Completed by SLP: No Oral Cavity - Dentition: Edentulous (son will bring upper denture today) Self-Feeding Abilities: Needs assist;Needs set up Patient Positioning: Upright in bed Baseline Vocal Quality: Low vocal intensity Volitional Cough: Weak Volitional Swallow: Unable to elicit    Oral/Motor/Sensory Function Overall Oral Motor/Sensory Function: Moderate impairment Facial ROM: Reduced right Facial Symmetry: Abnormal symmetry right Facial Strength: Reduced right Facial Sensation: Reduced right Lingual ROM: Reduced right;Reduced left Lingual Strength: Reduced Mandible: Within Functional Limits   Ice Chips Ice chips: Not tested   Thin Liquid Thin Liquid: Impaired Presentation: Cup Oral Phase Impairments: Reduced labial seal Oral Phase Functional Implications: Right anterior spillage;Oral holding;Prolonged oral transit Pharyngeal  Phase Impairments: Cough - Immediate;Suspected delayed Swallow    Nectar Thick Nectar Thick Liquid: Impaired Presentation: Cup Oral phase functional implications: Oral holding;Prolonged oral transit Pharyngeal Phase Impairments: Throat Clearing - Delayed   Honey Thick Honey Thick Liquid: Not tested   Puree Puree: Impaired Oral Phase Functional  Implications: Oral holding;Prolonged oral transit   Solid   GO   Solid: Impaired Oral Phase Impairments: Reduced lingual movement/coordination Oral Phase Functional Implications: Right lateral sulci pocketing (SLP removed cracker)    Functional Assessment Tool  Used: skilled clinical judgement Functional Limitations: Spoken language expressive Spoken Language Expression Current Status [phone]): At least 80 percent but less than 100 percent impaired, limited or restricted Spoken Language Expression Goal Status [phone]): At least 60 percent but less than 80 percent impaired, limited or restricted   [name] 05/22/2015,9:55 AM   [name] [name][employer]Ed [employer] [phone]

## 2015-05-22 NOTE — Evaluation (Signed)
Speech Language Pathology Evaluation Patient Details Name: Jonathan Bowen MRN: [id number] DOB: 07/09/1945 Today's Date: 05/22/2015 Time: 5638-7564 SLP Time Calculation (min) (ACUTE ONLY): 18 min  Problem List:  Patient Active Problem List   Diagnosis Date Noted  . CVA (cerebral infarction) 05/21/2015  . Acute CVA (cerebrovascular accident) ([place]) 05/21/2015   Past Medical History:  Past Medical History  Diagnosis Date  . Smoker    Past Surgical History: History reviewed. No pertinent past surgical history. HPI:  70 y.o. 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 No CXR or prior ST notes.    Assessment / Plan / Recommendation Clinical Impression  Pt presents with global aphasia with greater expressive language impairment. Verbal expression is limited to spontaneous utterances (greetings and "I don't know" etc), difficulty repeating with visual cues. 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 Cognitive deficits in the area of selt control and monitoring with significant impulsivity. Pt would benefit from continued ST and inpatient rehab stay.    SLP Assessment  Patient needs continued Speech Lanaguage Pathology Services    Follow Up Recommendations  Inpatient Rehab    Frequency and Duration min 2x/week  2 weeks      SLP Evaluation Prior Functioning  Cognitive/Linguistic Baseline: Within functional limits  Lives With: Son   Cognition  Overall Cognitive Status: Impaired/Different from baseline (son states pt is "impatient and stubborn prior" ) Arousal/Alertness: Awake/alert Orientation Level: Disoriented to situation;Oriented to person Attention:  Sustained Sustained Attention: Impaired Sustained Attention Impairment: Verbal basic;Functional basic Memory:  (will assess further) Awareness: Impaired Awareness Impairment: Emergent impairment;Anticipatory impairment Problem Solving: Impaired Problem Solving Impairment: Functional basic Executive Function: Self Monitoring;Self Correcting Self Monitoring: Impaired Self Correcting: Impaired Behaviors: Impulsive;Perseveration Safety/Judgment: Impaired    Comprehension  Auditory Comprehension Overall Auditory Comprehension: Impaired Yes/No Questions:  (TBA) Commands: Impaired One Step Basic Commands: 25-49% accurate (apraxia impacts) Interfering Components: Attention Visual Recognition/Discrimination Discrimination: Not tested Reading Comprehension Reading Status: Not tested    Expression Expression Primary Mode of Expression:  (several automatic utterances, gestures) Verbal Expression Overall Verbal Expression: Impaired Initiation: Impaired Automatic Speech:  (counted using visual cue 80% accurate) Level of Generative/Spontaneous Verbalization: Word Repetition: Impaired Level of Impairment: Word level Naming: Impairment Responsive: Not tested Confrontation: Impaired Convergent: 0-24% accurate Divergent: Not tested Verbal Errors: Not aware of errors;Neologisms Pragmatics: Impairment Impairments: Eye contact Written Expression Dominant Hand: Left Written Expression:  (will assess)   Oral / Motor  Oral Motor/Sensory Function Overall Oral Motor/Sensory Function: Moderate impairment Facial ROM: Reduced right Facial Symmetry: Abnormal symmetry right Facial Strength: Reduced right Facial Sensation: Reduced right Lingual ROM: Reduced right;Reduced left Lingual Strength: Reduced Mandible: Within Functional Limits Motor Speech Overall Motor Speech: Impaired Respiration: Within functional limits Phonation: Low vocal intensity Resonance: Within functional  limits Articulation: Impaired Level of Impairment: Word Intelligibility: Intelligibility reduced Word: 50-74% accurate Motor Planning: Impaired Level of Impairment: Word Motor Speech Errors: Unaware Interfering Components: Hearing loss (bilateral per dtr)   GO          Functional Assessment Tool Used: skilled clinical judgement Functional Limitations: Spoken language expressive Spoken Language Expression Current Status ([id number]): At least 80 percent but less than 100 percent impaired, limited or restricted Spoken Language Expression Goal Status [phone]): At least 60 percent but less  than 80 percent impaired, limited or restricted         [name] 05/22/2015, 9:55 AM   [name] [name] [phone]

## 2015-05-22 NOTE — Care Management Note (Addendum)
Case Management Note  Patient Details  Name: Jonathan Bowen MRN: [id number] Date of Birth: 24-Jun-1945  Subjective/Objective:                 Pt  admitted to ED with AMS. MRI shows acute multifocal infarct in the MCA distribution, with global aphasia and right sided hemiparesis.Pt's 1st hospitalization. Resides with son. Independent with ADL's  PTA. No dme usages. Pt without PCP. Information Radio producer) given to daughter to help identify PCP for dad.   Action/Plan:  CIR per PT's recommendation. CM to f/u with disposition needs. Expected Discharge Date:                  Expected Discharge Plan:  [place]  In-House Referral:     Discharge planning Services  CM Consult  Post Acute Care Choice:    Choice offered to:     DME Arranged:    DME Agency:     HH Arranged:    [place] Agency:     Status of Service:  In process, will continue to follow  Medicare Important Message Given:    Date Medicare IM Given:    Medicare IM give by:    Date Additional Medicare IM Given:    Additional Medicare Important Message give by:     If discussed at [place] of Stay Meetings, dates discussed:    Additional Comments:  Jonathan Bowen) [phone], [address] (Daughter)[phone]    Jonathan Bowen, [place] [phone] 05/22/2015, 1:40 PM

## 2015-05-22 NOTE — Progress Notes (Signed)
STROKE TEAM PROGRESS NOTE   HISTORY AT THE TIME OF ADMISSION Jonathan Bowen is an 70 y.o. male who does smoke "alot per family" but has not seen a MD and thus has no PMHx. Per family cholesterol and DM are in the family. Yesterday he was noted to not speak to the son and seemed not to be able to recognize him. This AM daughter noted he again was not speaking and seemed confused. Due to her noting a facial droop and some weakness on the right side he was brought to ED. CT head confirmed a left insular cortex wedge shape hypodensity. Currently he is both expressive and receptive aphasic. HE has a right facial droop and right sided weakness.  Per family he did take a ASA daily.  Date last known well: 2.2.2017 Time last known well:Time: 16:30 tPA Given: no out of window  SUBJECTIVE (INTERVAL HISTORY) His family is not at the bedside.  Overall he is unable to articulate how he feels his condition has changed.  No events overnight  OBJECTIVE Temp:  [98.2 F (36.8 C)-99.6 F (37.6 C)] 98.8 F (37.1 C) (02/04 0638) Pulse Rate:  [66-79] 75 (02/04 0254) Cardiac Rhythm:  [-] Normal sinus rhythm (02/03 2050) Resp:  [16-25] 18 (02/04 0345) BP: (155-215)/(61-113) 155/65 mmHg (02/04 0638) SpO2:  [90 %-100 %] 92 % (02/04 0638) Weight:  [100.2 kg (220 lb 14.4 oz)-113.399 kg (250 lb)] 100.2 kg (220 lb 14.4 oz) (02/03 1515)  CBC:  Recent Labs Lab 05/21/15 0918 05/21/15 0928  WBC 9.3  --   NEUTROABS 5.0  --   HGB 16.4 17.7*  HCT 49.1 52.0  MCV 91.3  --   PLT 181  --     Basic Metabolic Panel:  Recent Labs Lab 05/21/15 0918 05/21/15 0928  NA 143 143  K 4.2 4.2  CL 108 108  CO2 22  --   GLUCOSE 118* 115*  BUN 14 17  CREATININE 1.06 0.90  CALCIUM 9.4  --     Lipid Panel:    Component Value Date/Time   CHOL 191 05/22/2015 0613   TRIG 143 05/22/2015 0613   HDL 26* 05/22/2015 0613   CHOLHDL 7.3 05/22/2015 0613   VLDL 29 05/22/2015 0613   LDLCALC 136* 05/22/2015 0613    HgbA1c: No  results found for: HGBA1C Urine Drug Screen:    Component Value Date/Time   LABOPIA NONE DETECTED 05/21/2015 2046   COCAINSCRNUR NONE DETECTED 05/21/2015 2046   LABBENZ NONE DETECTED 05/21/2015 2046   AMPHETMU NONE DETECTED 05/21/2015 2046   THCU NONE DETECTED 05/21/2015 2046   LABBARB NONE DETECTED 05/21/2015 2046      IMAGING  Ct Angio Head and Neck W/cm &/or Wo Cm 05/21/2015   1. 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 Poorly reconstituted flow in the left MCA M1 segment, but more satisfactory distal left MCA collaterals.  2. 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  3. Fairly extensive soft plaque at the great vessel origins and in the neck in general. Adherent soft plaque or thrombus in the proximal left subclavian artery without significant stenosis.  4. No vertebral artery or posterior circulation stenosis.  5. No significant change in left basal ganglia and small cortically based infarcts in the left insula and operculum since 1013 hours today. No associated hemorrhage or mass effect.    Ct Head Wo Contrast 05/21/2015   Wedge-shaped hypodensity involving the left insular cortex, external capsule, adjacent  basal ganglia, and posterior limb of the internal capsule extending into the deep white matter compatible with an acute to subacute infarct. No associated intracranial hemorrhage, mass effect, or midline shift. Age related brain atrophy   [name] Contrast 05/22/2015   Multifocal moderate acute LEFT middle cerebral artery territory infarct without hemorrhagic conversion. Slow flow versus occluded LEFT carotid terminus and MCA corresponding to known angiographic abnormality.   PHYSICAL EXAM  HEENT- Normocephalic, no lesions, without obvious abnormality. Normal external eye and conjunctiva. Normal external nose, mucus membranes and septum.  Cardiovascular- S1, S2 normal, pulses palpable throughout  Lungs-  chest clear, no wheezing, rales, normal symmetric air entry Abdomen- normal findings: bowel sounds normal Extremities- no edema  Neurological Examination Mental Status: Lethargic, receptive and expressive aphasia. Has difficulty mimicking visual commands. Does state name but does not answer other orientation questions  Cranial Nerves: II: Visual fields grossly normal, pupils equal, round, reactive to light and accommodation III,IV, VI: extra-ocular motions intact bilaterally V,VII: bilateral corneals VIII: hearing grossly intact IX,X: uvula rises symmetrically XI: mimics shrug.  Weaker on the right XII: opens mouth to command but does not protrude tongue   Motor: Right :Upper extremity 4/5Left: Upper extremity 5/5 Lower extremity 4/5Lower extremity 5/5  Sensory: withdraws to pain bilaterally  Cerebellar/Gait: not tested  ASSESSMENT/PLAN [name] is a 70 y.o. male with history of tobacco use and no regular medical care prior to admission presenting with receptive and expressive aphasia with right hemiparesis. He did not receive IV t-PA due to late presentation.  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  Resultant  Right sided weakness and aphasia  MRI - Multifocal moderate acute LEFT middle cerebral artery territory infarct without hemorrhagic conversion.  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  Carotid Doppler - refer to CTA  2D Echo - pending  LDL - 136  HgbA1c pending  VTE prophylaxis - Lovenox  Diet NPO time specified  aspirin 81 mg daily prior to admission, now on aspirin 300 mg suppository daily  Ongoing aggressive stroke risk factor management  Therapy recommendations: Inpatient  Rehabilitation recommended. Screening ordered.  Disposition: Pending  Hypertension  Blood pressure tends to be elevated  Permissive hypertension (OK if < 220/120) but gradually normalize in 5-7 days  Hyperlipidemia  Home meds: 136 resumed in hospital  LDL 136, goal < 70  Add Lipitor when swallowing has been cleared.  Continue statin at discharge  Other Stroke Risk Factors  Advanced age  Cigarette smoker, advised to stop smoking  Obesity, Body mass index is 35.67 kg/(m^2).   Other Active Problems  No regular medical care prior to admission.  Hospital day # 1  Mikey Bussing [facility] Pager [phone] 05/22/2015, 9:58 AM  ATTENDING NOTE: Patient was seen and examined by me personally. Documentation reflects findings. The laboratory and radiographic studies reviewed by me. ROS pertinent positives could not be fully documented due to LOC  Condition: Stable  Assessment and plan completed by me personally and fully documented above. Plans/Recommendations include:     Stroke work-up ongoing  Recommend high dose statin  Continue ASA  SIGNED BY: Dr. Elissa Hefty     To contact Stroke Continuity provider, please refer to [email or web address] After hours, contact General Neurology

## 2015-05-22 NOTE — Care Management Obs Status (Signed)
[facility] NOTIFICATION   Patient Details  Name: Berlie Hatchel MRN: [id number] Date of Birth: [birth year]   Medicare Observation Status Notification Given:  Yes    Sharin Mons, RN 05/22/2015, 9:04 AM

## 2015-05-22 NOTE — Discharge Summary (Signed)
Name: Jonathan Bowen MRN: [id number] DOB: 1945-11-21 70 y.o. PCP: No primary care provider on file.  Date of Admission: 05/21/2015  9:09 AM Date of Discharge: 05/24/2015 Attending Physician: Thayer Headings, MD  Discharge Diagnosis: 1. CVA 2. Pre-DM 3. Hyperlipidemia 4. HTN 5. UTI  Active Problems:   CVA (cerebral infarction)   Acute CVA (cerebrovascular accident) ([place])   Hyperlipidemia   Essential hypertension   Tobacco abuse   Morbid obesity due to excess calories ([place])   UTI (lower urinary tract infection)   Hemiparesis, aphasia, and dysphagia as late effect of cerebrovascular accident (CVA) ([place])  Discharge Medications:   Medication List    TAKE these medications        amLODipine 5 MG tablet  Commonly known as:  NORVASC  Take 1 tablet (5 mg total) by mouth daily.  Start taking on:  05/25/2015     aspirin EC 81 MG tablet  Take 81 mg by mouth daily.     atorvastatin 80 MG tablet  Commonly known as:  LIPITOR  Take 1 tablet (80 mg total) by mouth daily at 6 PM.     clopidogrel 75 MG tablet  Commonly known as:  PLAVIX  Take 1 tablet (75 mg total) by mouth daily.     nicotine 21 mg/24hr patch  Commonly known as:  NICODERM CQ - dosed in mg/24 hours  Place 1 patch (21 mg total) onto the skin daily.     sulfamethoxazole-trimethoprim 800-160 MG tablet  Commonly known as:  BACTRIM DS,SEPTRA DS  Take 1 tablet by mouth every 12 (twelve) hours.  Start taking on:  05/25/2015        Disposition and follow-up:   [name] was discharged from [facility] in Stable condition.    At the hospital follow up visit please address:  - tobacco cessation - patient needs PCP follow up if not going to establish with [facility].  Please address what their plans are for PCP follow up. - patient needs to follow up with vascular surgery (Dr. Sherren Mocha Early) in 2 weeks from discharge.  Please see below for appointment specifics. - patient needs  neurology follow up outpatient.  See below for appointment specifics and inquire as to whether they have been contacted for appointment. - completion of antibiotics for UTI.  Stop date: 05/29/2015 - HTN management.  Discharged on amlodipine '5mg'$  daily but may need adjustment of regimen - see if he is getting the home health services ordered and inquire about other needs - patient needs to be on plavix and aspirin '81mg'$  for 3 months then plavix alone. - will need repeat A1C in 3-6 months given his pre-diabetes  2.  Labs / imaging needed at time of follow-up: CMP to look at liver enzymes, lipid profile in 6 weeks to assess response to atorvastatin  3.  Pending labs/ test needing follow-up: urine culture  Follow-up Appointments: Follow-up Information    Follow up with Curt Jews, MD On 06/08/2015.   Specialties:  Vascular Surgery, Cardiology   Why:  appointment time at 3pm to discuss surgery of right carotid artery   Contact information:   [place] [address] [phone]       Follow up with SETHI,PRAMOD, MD. Call in 4 weeks.   Specialties:  Neurology, Radiology   Why:  for neurology follow up.  Call them if you do not here from them in a couple weeks about scheduling an appointment.  Contact information:   [address] [place] [address] [phone]       Go to [place].   Why:  at appointment time given to you before discharge   Contact information:   [address] [place] [address] [phone]      Discharge Instructions: Discharge Instructions    Ambulatory referral to Neurology    Complete by:  As directed   An appointment is requested in approximately: 4 weeks           Consultations: Treatment Team:  Md Stroke, MD Rosetta Posner, MD  Procedures Performed:  Ct Angio Head W/cm &/or Wo Cm  05/21/2015  ADDENDUM REPORT: 05/21/2015 18:26 ADDENDUM: Study discussed by telephone with  Dr. Roland Rack on 05/21/2015 at 1820 hours. Electronically Signed   By: Genevie Ann M.D.   On: 05/21/2015 18:26  05/21/2015  CLINICAL DATA:  70 year old male with altered mental status, confusion. Initial encounter. EXAM: CT ANGIOGRAPHY HEAD AND NECK TECHNIQUE: Multidetector CT imaging of the head and neck was performed using the standard protocol during bolus administration of intravenous contrast. Multiplanar CT image reconstructions and MIPs were obtained to evaluate the vascular anatomy. Carotid stenosis measurements (when applicable) are obtained utilizing NASCET criteria, using the distal internal carotid diameter as the denominator. CONTRAST:  65m OMNIPAQUE IOHEXOL 350 MG/ML SOLN COMPARISON:  Head CT without contrast 1012 hours today. FINDINGS: CTA NECK Skeleton: Absent dentition. Degenerative changes in the cervical spine, especially the upper cervical facets on the right. No acute osseous abnormality identified. Mild paranasal sinus mucosal thickening or mucous retention cyst. Other neck: Negative lung apices. No superior mediastinal lymphadenopathy. Small volume retained fluid in the thoracic esophagus. Thyroid, larynx, pharynx, parapharyngeal spaces, retropharyngeal space, sublingual space, submandibular glands, and parotid glands are within normal limits. No cervical lymphadenopathy. Visualized orbit soft tissues are within normal limits. Aortic arch: 3 vessel arch configuration with extensive predominantly soft atherosclerotic plaque at the arch and involving the great vessel origins. Calcified plaque in the distal arch. Right carotid system: No brachiocephalic artery or right CCA origin stenosis despite soft plaque. Soft plaque continues along the lateral right CCA proximally, and decreases above the level of the thyroid. There is a small area of ulcerated plaque at the level of the larynx anteriorly (series 41, image 57). At the right carotid bifurcation there is bulky calcified and soft plaque  resulting in high-grade stenosis approaching a radiographic string sign (series 402, image 149 and series 403, image 132). Despite this the right ICA remains patent. No additional stenosis to the skullbase. Left carotid system: Soft plaque at the left CCA origin resulting in stenosis up to 55-60 % with respect to the distal vessel. Circumferential soft plaque again becomes severe in the distal left CCA just proximal to the bifurcation with high-grade stenosis there are (series 401, image 72), and continuing to the left ICA origin (radiographic string sign stenosis at the left ICA origin. Series 402, image 149 and series 404, image 158). Despite this the cervical left ICA remains patent, but the left ICA siphon is occluded as detailed below. Vertebral arteries: Adherent plaque or thrombus in the proximal left subclavian artery best seen on series 403, image 151. No high-grade proximal left subclavian stenosis results. The left vertebral artery origin is normal. Mild irregularity of the cervical left vertebral artery without definite stenosis. No proximal right subclavian artery stenosis. Mild if any right vertebral artery origin stenosis related to soft  plaque. Occasional calcified plaque and irregularity in the right V2 segment without additional stenosis. CTA HEAD Posterior circulation: Codominant distal vertebral arteries with mild V4 segment irregularity, not hemodynamically significant. Both PICA origins are within normal limits. Patent vertebrobasilar junction. No basilar artery stenosis. SCA and PCA origins are normal. Bilateral PCA branches are within normal limits. Posterior communicating arteries are diminutive or absent. Anterior circulation: Right ICA siphon is patent with mild irregularity and no stenosis. A diminutive right posterior communicating artery is visible on series 404, image 112. There is a small infundibulum at its origin. The right ICA terminus is normal. Right MCA and ACA origins are  normal. The right MCA M1 segment is patent but irregular with up to mild stenosis. Right MCA bifurcation and right MCA branches otherwise are within normal limits. Right ACA A1 segment anterior communicating artery, and right ACA branches are within normal limits. The left ICA siphon is occluded in the distal cavernous segment and at the anterior genu with irregular cavernous filling defect (series 404, image 132). There is poor reconstituted flow at the left ICA terminus, and in the left MCA M1 segment. The left ACA appears satisfactorily reconstituted from the anterior communicating artery. However, the distal left MCA M2 and M3 branches demonstrate better collateral flow (see series 405, image 19). Venous sinuses: Patent. Anatomic variants: None. Delayed phase: No significant additional cortically based infarct in the left MCA territory since 1013 hours today. The left basal ganglia, insula, and operculum primarily appear affected as before. No associated hemorrhage or mass effect. No abnormal enhancement identified. IMPRESSION: 1. 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 Poorly reconstituted flow in the left MCA M1 segment, but more satisfactory distal left MCA collaterals. 2. 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 3. Fairly extensive soft plaque at the great vessel origins and in the neck in general. Adherent soft plaque or thrombus in the proximal left subclavian artery without significant stenosis. 4. No vertebral artery or posterior circulation stenosis. 5. No significant change in left basal ganglia and small cortically based infarcts in the left insula and operculum since 1013 hours today. No associated hemorrhage or mass effect. Electronically Signed: By: Genevie Ann M.D. On: 05/21/2015 18:14   Ct Head Wo Contrast  05/21/2015  CLINICAL DATA:  Altered mental status, nonverbal EXAM: CT HEAD WITHOUT CONTRAST TECHNIQUE: Contiguous axial images  were obtained from the base of the skull through the vertex without contrast. COMPARISON:  None FINDINGS: Ill-defined wedge-shaped hypodensity involving the left insular cortex, external capsule, basal ganglia, and posterior limb of the left internal capsule. This extends into the coronal radiata/periventricular white matter, image 18. Appearance is compatible with an acute to subacute infarct. No associated hemorrhage, significant mass effect or edema. No midline shift. Ventricles remain symmetric. No hydrocephalus. Diffuse age-related brain atrophy. No extra-axial fluid collection. Cisterns remain patent. Cerebellar atrophy as well. Orbits are symmetric. Skull appears intact. Mastoids are clear. Retention cyst in the left maxillary sinus inferiorly measuring 15 mm. Similar small retention cyst in the left frontal sinus. Other sinuses remain clear. IMPRESSION: Wedge-shaped hypodensity involving the left insular cortex, external capsule, adjacent basal ganglia, and posterior limb of the internal capsule extending into the deep white matter compatible with an acute to subacute infarct. No associated intracranial hemorrhage, mass effect, or midline shift. Age related brain atrophy These results were called by telephone at the time of interpretation on 05/21/2015 at 10:33 am to [facility] , who  verbally acknowledged these results. Electronically Signed   By: Jerilynn Mages.  Shick M.D.   On: 05/21/2015 10:35   Ct Angio Neck W/cm &/or Wo/cm  05/21/2015  ADDENDUM REPORT: 05/21/2015 18:26 ADDENDUM: Study discussed by telephone with Dr. Roland Rack on 05/21/2015 at 1820 hours. Electronically Signed   By: Genevie Ann M.D.   On: 05/21/2015 18:26  05/21/2015  CLINICAL DATA:  70 year old male with altered mental status, confusion. Initial encounter. EXAM: CT ANGIOGRAPHY HEAD AND NECK TECHNIQUE: Multidetector CT imaging of the head and neck was performed using the standard protocol during bolus administration of intravenous contrast.  Multiplanar CT image reconstructions and MIPs were obtained to evaluate the vascular anatomy. Carotid stenosis measurements (when applicable) are obtained utilizing NASCET criteria, using the distal internal carotid diameter as the denominator. CONTRAST:  76m OMNIPAQUE IOHEXOL 350 MG/ML SOLN COMPARISON:  Head CT without contrast 1012 hours today. FINDINGS: CTA NECK Skeleton: Absent dentition. Degenerative changes in the cervical spine, especially the upper cervical facets on the right. No acute osseous abnormality identified. Mild paranasal sinus mucosal thickening or mucous retention cyst. Other neck: Negative lung apices. No superior mediastinal lymphadenopathy. Small volume retained fluid in the thoracic esophagus. Thyroid, larynx, pharynx, parapharyngeal spaces, retropharyngeal space, sublingual space, submandibular glands, and parotid glands are within normal limits. No cervical lymphadenopathy. Visualized orbit soft tissues are within normal limits. Aortic arch: 3 vessel arch configuration with extensive predominantly soft atherosclerotic plaque at the arch and involving the great vessel origins. Calcified plaque in the distal arch. Right carotid system: No brachiocephalic artery or right CCA origin stenosis despite soft plaque. Soft plaque continues along the lateral right CCA proximally, and decreases above the level of the thyroid. There is a small area of ulcerated plaque at the level of the larynx anteriorly (series 41, image 57). At the right carotid bifurcation there is bulky calcified and soft plaque resulting in high-grade stenosis approaching a radiographic string sign (series 402, image 149 and series 403, image 132). Despite this the right ICA remains patent. No additional stenosis to the skullbase. Left carotid system: Soft plaque at the left CCA origin resulting in stenosis up to 55-60 % with respect to the distal vessel. Circumferential soft plaque again becomes severe in the distal left CCA  just proximal to the bifurcation with high-grade stenosis there are (series 401, image 72), and continuing to the left ICA origin (radiographic string sign stenosis at the left ICA origin. Series 402, image 149 and series 404, image 158). Despite this the cervical left ICA remains patent, but the left ICA siphon is occluded as detailed below. Vertebral arteries: Adherent plaque or thrombus in the proximal left subclavian artery best seen on series 403, image 151. No high-grade proximal left subclavian stenosis results. The left vertebral artery origin is normal. Mild irregularity of the cervical left vertebral artery without definite stenosis. No proximal right subclavian artery stenosis. Mild if any right vertebral artery origin stenosis related to soft plaque. Occasional calcified plaque and irregularity in the right V2 segment without additional stenosis. CTA HEAD Posterior circulation: Codominant distal vertebral arteries with mild V4 segment irregularity, not hemodynamically significant. Both PICA origins are within normal limits. Patent vertebrobasilar junction. No basilar artery stenosis. SCA and PCA origins are normal. Bilateral PCA branches are within normal limits. Posterior communicating arteries are diminutive or absent. Anterior circulation: Right ICA siphon is patent with mild irregularity and no stenosis. A diminutive right posterior communicating artery is visible on series 404, image 112. There is a small  infundibulum at its origin. The right ICA terminus is normal. Right MCA and ACA origins are normal. The right MCA M1 segment is patent but irregular with up to mild stenosis. Right MCA bifurcation and right MCA branches otherwise are within normal limits. Right ACA A1 segment anterior communicating artery, and right ACA branches are within normal limits. The left ICA siphon is occluded in the distal cavernous segment and at the anterior genu with irregular cavernous filling defect (series 404,  image 132). There is poor reconstituted flow at the left ICA terminus, and in the left MCA M1 segment. The left ACA appears satisfactorily reconstituted from the anterior communicating artery. However, the distal left MCA M2 and M3 branches demonstrate better collateral flow (see series 405, image 19). Venous sinuses: Patent. Anatomic variants: None. Delayed phase: No significant additional cortically based infarct in the left MCA territory since 1013 hours today. The left basal ganglia, insula, and operculum primarily appear affected as before. No associated hemorrhage or mass effect. No abnormal enhancement identified. IMPRESSION: 1. 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 Poorly reconstituted flow in the left MCA M1 segment, but more satisfactory distal left MCA collaterals. 2. 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 3. Fairly extensive soft plaque at the great vessel origins and in the neck in general. Adherent soft plaque or thrombus in the proximal left subclavian artery without significant stenosis. 4. No vertebral artery or posterior circulation stenosis. 5. No significant change in left basal ganglia and small cortically based infarcts in the left insula and operculum since 1013 hours today. No associated hemorrhage or mass effect. Electronically Signed: By: Genevie Ann M.D. On: 05/21/2015 18:14   [name] Contrast  05/22/2015  CLINICAL DATA:  Aphasic and altered mental status, RIGHT sided weakness and RIGHT facial droop. History of smoking, stroke. Known carotid stenosis and LEFT ICA occlusion. EXAM: MRI HEAD WITHOUT CONTRAST TECHNIQUE: Multiplanar, multiecho pulse sequences of the brain and surrounding structures were obtained without intravenous contrast. COMPARISON:  CT head March 19, 2016 FINDINGS: Subcentimeter focus of reduced diffusion LEFT posterior temporal cortex. Patchy reduced diffuse in LEFT temporal lobe/insula and, RIGHT  basal ganglia. Additional subcentimeter foci of reduced diffusion LEFT frontal and parietal lobes. Associated low ADC values and mild FLAIR T2 hyperintense signal. 3 mm LEFT-to-RIGHT midline shift.No susceptibility artifact to suggest hemorrhage. Ventricles and sulci are normal for patient's age.  No mass lesions. No abnormal extra-axial fluid collections. Loss of LEFT carotid terminus flow void with poor flow void LEFT middle cerebral artery corresponding to angiographic abnormality. Ocular globes and orbital contents are unremarkable. Mild paranasal sinus mucosal thickening with LEFT maxillary sinus mucosal retention cyst. Mastoid air cells are well aerated. No abnormal sellar expansion. No cerebellar tonsillar ectopia. No suspicious calvarial bone marrow signal. Patient is edentulous. IMPRESSION: Multifocal moderate acute LEFT middle cerebral artery territory infarct without hemorrhagic conversion. Slow flow versus occluded LEFT carotid terminus and MCA corresponding to known angiographic abnormality. Electronically Signed   By: Elon Alas M.D.   On: 05/22/2015 04:52    2D Echo:  - Left ventricle: The cavity size was normal. Wall thickness was increased in a pattern of mild LVH. Systolic function was normal. The estimated ejection fraction was in the range of 60% to 65%. Wall motion was normal; there were no regional wall motion abnormalities. Doppler parameters are consistent with abnormal left ventricular relaxation (grade 1 diastolic dysfunction). - Aortic valve: Mildly calcified annulus. Trileaflet. - Mitral  valve: There was trivial regurgitation. - Left atrium: The atrium was at the upper limits of normal in size. - Atrial septum: No defect or patent foramen ovale was identified. - Tricuspid valve: There was physiologic regurgitation. - Pulmonary arteries: Systolic pressure could not be accurately estimated. - Pericardium, extracardiac: There was no pericardial  effusion.  Impressions:  - Mild LVH with LVEF 60-65%. Grade 1 diastolic dysfunction with normal filling pressure. Upper normal left atrial chamber size. Mildly calcified aortic annulus. Trivial mitral regurgitation. No obvious PFO or ASD.  Cardiac Cath: none  Admission HPI:  [name] is a 70 y.o. man with past medical history of tobacco abuse who presented to the emergency department due to altered mental status. Patient was unable to contribute to the HPI due to his current medical condition, but the patients daughter was present and able to provide the history. Patient currently with expressive and receptive aphasia that daughter reports seems improved. He is unable to provide an answer to orientation questions, unable to comprehend how many quarters are in a dollar, but he is able to repeat back "no if's and or but's" without slurred speech. Daughter reports that her father was last seen normal last night by patients son, with whom he lives. Apparently, patient was unable to recognize son and was not speaking as much. This morning, patient did not answer his son when spoken to. When daughter arrived to the house, she noticed patient was not speaking sensically, his speech was slurred, he was confused and crying, was sitting naked on the bed, and had a right facial droop with right sided weakness. She then part him to the Emergency Department for further evaluation. Patient takes no daily medications except Aspirin '81mg'$  for primary prevention.  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 He also has significant bilateral carotid artery disease on CTA. He may be a candidate for an endarterectomy and will follow up with vascular surgery in about 2 weeks to further discuss right CEA. Smoking, cholesterol, HTN are risk factors so we'll need to modify these going forward. - continue dual antiplatelets for 3 months with Plavix '75mg'$  daily and Aspirin '81mg'$  daily.  Plavix alone after 3 months - home health PT, OT, SLP, RN, CSW, and aide  Complicated Urinary Tract Infection: Patient unable to express whether he is having any symptoms and so we will provide treatment. I wonder if some of his encephalopathy is from infection. He received 3 days of ceftriaxone as an inpatient.  Will discharge on TMP-SMX and follow up cultures. -TMP-SMX twice daily for 4 more days.  Last day of treatment is 05/29/2015  Tobacco abuse: He was smoking 2-3 packs per day. We've discussed the importance of smoking cessation to his family and will continue nicotine patch upon discharge. -Continue nicotine patch '21mg'$  daily  Hyperlipidemia: His LDL was 136. Started on high intensity statin. -Continue atorvastatin '80mg'$  daily once he can swallow -Check LFTs in a few weeks  Hypertension: We allowed for permissive hypertension in the acute phase of his  stroke. -Start Amlodipine '5mg'$  daily.  May need titration of his HTN medications.  Pre-Diabetes: A1C 5.8 when checked at admission.  Encourage diet and lifestyle modifications and follow as an outpatient.  Discharge Vitals:   BP 150/97 mmHg  Pulse 88  Temp(Src) 98.7 F (37.1 C) (Oral)  Resp 20  Ht '5\' 6"'$  (1.676 m)  Wt 220 lb 14.4 oz (100.2 kg)  BMI 35.67 kg/m2  SpO2 93%  Discharge Labs:  Results for orders placed or performed during the hospital encounter of 05/21/15 (from the past 24 hour(s))  Basic metabolic panel     Status: Abnormal   Collection Time:  05/24/15  8:03 AM  Result Value Ref Range   Sodium 141 135 - 145 mmol/L   Potassium 4.5 3.5 - 5.1 mmol/L   Chloride 107 101 - 111 mmol/L   CO2 23 22 - 32 mmol/L   Glucose, Bld 112 (H) 65 - 99 mg/dL   BUN 20 6 - 20 mg/dL   Creatinine, Ser 1.03 0.61 - 1.24 mg/dL   Calcium 9.0 8.9 - 10.3 mg/dL   GFR calc non Af Amer >60 >60 mL/min   GFR calc Af Amer >60 >60 mL/min   Anion gap 11 5 - 15    Signed: Jule Ser, DO 05/24/2015, 3:08 PM    Services Ordered on Discharge: [place] PT, OT, SLP, RN, Aide, CSW Equipment Ordered on Discharge: rolling walker, bedside commode

## 2015-05-22 NOTE — Progress Notes (Signed)
Rehab Admissions Coordinator Note:  Patient was screened by Retta Diones for appropriateness for an Inpatient Acute Rehab Consult.  At this time, we are recommending Inpatient Rehab consult.  Jodell Cipro M 05/22/2015, 2:10 PM  I can be reached at [phone].

## 2015-05-22 NOTE — Evaluation (Signed)
Physical Therapy Evaluation Patient Details Name: Jonathan Bowen MRN: [id number] DOB: 1946-03-14 Today's Date: 05/22/2015   History of Present Illness  pt is a 70 y/o male with h/o tobacco abuse admitte to ED with AMS.  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  Clinical Impression  Pt admitted with/for AMS and s/s of stroke confirmed on MRI.  Pt currently limited functionally due to the problems listed. ( See problems list.)   Pt will benefit from PT to maximize function and safety in order to get ready for next venue listed below.     Follow Up Recommendations CIR;Supervision/Assistance - 24 hour    Equipment Recommendations  Other (comment) (TBA, has no equipment at this point)    Recommendations for Other Services Rehab consult     Precautions / Restrictions Precautions Precautions: Fall      Mobility  Bed Mobility               General bed mobility comments: sitting on the side of bed on arrive, alarm sounding  Transfers Overall transfer level: Needs assistance   Transfers: Sit to/from Stand;Stand Pivot Transfers Sit to Stand: Mod assist Stand pivot transfers: Mod assist       General transfer comment: assist to come forward and stability assist.  R LE used paretically to pivot.  Ambulation/Gait             General Gait Details: NT  Stairs            Wheelchair Mobility    Modified Rankin (Stroke Patients Only) Modified Rankin (Stroke Patients Only) Pre-Morbid Rankin Score: No symptoms Modified Rankin: Severe disability     Balance Overall balance assessment: Needs assistance Sitting-balance support: Feet supported;No upper extremity supported Sitting balance-Leahy Scale: Fair     Standing balance support: Single extremity supported;Bilateral upper extremity supported Standing balance-Leahy Scale: Poor Standing balance comment: stood EOB x2 with UE supported on tray  table and also assisted on L side working on balance, symmetrical stance, w/shifts.                             Pertinent Vitals/Pain Pain Assessment: Faces Faces Pain Scale: No hurt    Home Living Family/patient expects to be discharged to:: Private residence Living Arrangements: Children (son who works days) Available Help at Discharge: Family;Available PRN/intermittently Type of Home: House Home Access: Stairs to enter Entrance Stairs-Rails: None Entrance Stairs-Number of Steps: 5 Home Layout: One level Home Equipment: None      Prior Function Level of Independence: Independent               Hand Dominance   Dominant Hand: Left    Extremity/Trunk Assessment   Upper Extremity Assessment: Defer to OT evaluation (R arm weakness with decr coordination)           Lower Extremity Assessment: RLE deficits/detail;LLE deficits/detail RLE Deficits / Details: difficult to MMT, grossly weak hip flexors and hams, quads 4-/5, df/pf 3/5 LLE Deficits / Details: wfl     Communication   Communication: Receptive difficulties;Expressive difficulties  Cognition Arousal/Alertness: Awake/alert Behavior During Therapy: Restless Overall Cognitive Status: Impaired/Different from baseline Area of Impairment: Attention;Following commands;Safety/judgement;Awareness;Problem solving   Current Attention Level: Focused   Following Commands: Follows one step commands inconsistently Safety/Judgement: Decreased awareness of deficits;Decreased awareness of safety Awareness: Intellectual Problem Solving: Requires tactile cues;Slow processing      General Comments  Exercises        Assessment/Plan    PT Assessment Patient needs continued PT services  PT Diagnosis Difficulty walking;Abnormality of gait;Acute pain (R hemiparesis)   PT Problem List Decreased strength;Decreased activity tolerance;Decreased balance;Decreased mobility;Decreased coordination;Decreased  knowledge of use of DME;Decreased safety awareness;Decreased knowledge of precautions  PT Treatment Interventions DME instruction;Gait training;Functional mobility training;Therapeutic activities;Balance training;Neuromuscular re-education;Patient/family education   PT Goals (Current goals can be found in the Care Plan section) Acute Rehab PT Goals Patient Stated Goal: pt unable to relate goals PT Goal Formulation: Patient unable to participate in goal setting Time For Goal Achievement: 06/05/15 Potential to Achieve Goals: Fair    Frequency Min 4X/week   Barriers to discharge   ? if pt can have family around 24/7 as needed at least in the short term    Co-evaluation               End of Session   Activity Tolerance: Patient tolerated treatment well Patient left: in chair;with call bell/phone within reach;with family/visitor present Nurse Communication: Mobility status    Functional Assessment Tool Used: clinical judgment Functional Limitation: Mobility: Walking and moving around Mobility: Walking and Moving Around Current Status (S1779): At least 20 percent but less than 40 percent impaired, limited or restricted Mobility: Walking and Moving Around Goal Status [phone]): At least 1 percent but less than 20 percent impaired, limited or restricted    Time: 1202-1249 PT Time Calculation (min) (ACUTE ONLY): 47 min   Charges:   PT Evaluation $PT Eval Moderate Complexity: 1 Procedure PT Treatments $Therapeutic Activity: 23-37 mins   PT G Codes:   PT G-Codes **NOT FOR INPATIENT CLASS** Functional Assessment Tool Used: clinical judgment Functional Limitation: Mobility: Walking and moving around Mobility: Walking and Moving Around Current Status (S9233): At least 20 percent but less than 40 percent impaired, limited or restricted Mobility: Walking and Moving Around Goal Status [phone]): At least 1 percent but less than 20 percent impaired, limited or restricted    Burnell Hurta,  Tessie Fass 05/22/2015, 1:07 PM 05/22/2015  Donnella Sham, PT [phone]  (pager)

## 2015-05-22 NOTE — Progress Notes (Signed)
Subjective: Patient seen and examined this morning.  No acute events since admission.  Son is present in the room who helped to confirm history as detailed by patients daughter at admission.  He continues to have expressive aphasia, decreased comprehension and unable to fully cooperate with neurological exam.  Able to understand some simple cues such as taking on and off his eye glasses.  Objective: Vital signs in last 24 hours: Filed Vitals:   05/22/15 0221 05/22/15 0345 05/22/15 0638 05/22/15 1049  BP: 200/77 184/75 155/65 194/83  Pulse: 79 72 75 79  Temp: 98.2 F (36.8 C) 98.4 F (36.9 C) 98.8 F (37.1 C) 100.4 F (38 C)  TempSrc: Oral Oral Oral Axillary  Resp: '18 18  18  '$ Height:      Weight:      SpO2: 98% 96% 92% 92%   Weight change:   Intake/Output Summary (Last 24 hours) at 05/22/15 1105 Last data filed at 05/22/15 0757  Gross per 24 hour  Intake    600 ml  Output    400 ml  Net    200 ml   General: moderately obese man, resting in bed, appears somewhat confused HEENT: PERRL, EOMI but has difficulty following commands, no scleral icterus, xanthelasma present Cardiac: RRR, no rubs, murmurs or gallops Pulm: normal work of breathing Abd: soft, nontender, nondistended, BS present Ext: warm and well perfused, no pedal edema Skin: no xanthomas noted on extensor surfaces of elbows Neuro: alert but unable to answer orientation questions appropriately.  Strength remains 4/5 on the RIGHT, 5/5 on the LEFT.  He still has difficulty following cues for neuro exam.  Mild RIGHT facial droop is present, expressive aphasia, some receptive aphasia  Lab Results: Basic Metabolic Panel:  Recent Labs Lab 05/21/15 0918 05/21/15 0928  NA 143 143  K 4.2 4.2  CL 108 108  CO2 22  --   GLUCOSE 118* 115*  BUN 14 17  CREATININE 1.06 0.90  CALCIUM 9.4  --    Liver Function Tests:  Recent Labs Lab 05/21/15 0918  AST 17  ALT 16*  ALKPHOS 56  BILITOT 0.6  PROT 6.5  ALBUMIN 3.8    No results for input(s): LIPASE, AMYLASE in the last 168 hours. No results for input(s): AMMONIA in the last 168 hours. CBC:  Recent Labs Lab 05/21/15 0918 05/21/15 0928  WBC 9.3  --   NEUTROABS 5.0  --   HGB 16.4 17.7*  HCT 49.1 52.0  MCV 91.3  --   PLT 181  --    Cardiac Enzymes: No results for input(s): CKTOTAL, CKMB, CKMBINDEX, TROPONINI in the last 168 hours. BNP: No results for input(s): PROBNP in the last 168 hours. D-Dimer: No results for input(s): DDIMER in the last 168 hours. CBG:  Recent Labs Lab 05/21/15 1751 05/21/15 2000 05/22/15 0018 05/22/15 0345 05/22/15 0748  GLUCAP 87 81 97 120* 107*   Hemoglobin A1C: No results for input(s): HGBA1C in the last 168 hours. Fasting Lipid Panel:  Recent Labs Lab 05/22/15 0613  CHOL 191  HDL 26*  LDLCALC 136*  TRIG 143  CHOLHDL 7.3   Thyroid Function Tests: No results for input(s): TSH, T4TOTAL, FREET4, T3FREE, THYROIDAB in the last 168 hours. Coagulation:  Recent Labs Lab 05/21/15 0918  LABPROT 13.9  INR 1.05   Anemia Panel: No results for input(s): VITAMINB12, FOLATE, FERRITIN, TIBC, IRON, RETICCTPCT in the last 168 hours. Urine Drug Screen: Drugs of Abuse     Component Value  Date/Time   LABOPIA NONE DETECTED 05/21/2015 2046   COCAINSCRNUR NONE DETECTED 05/21/2015 2046   LABBENZ NONE DETECTED 05/21/2015 2046   AMPHETMU NONE DETECTED 05/21/2015 2046   THCU NONE DETECTED 05/21/2015 2046   LABBARB NONE DETECTED 05/21/2015 2046    Alcohol Level:  Recent Labs Lab 05/21/15 1002  ETH <5   Urinalysis:  Recent Labs Lab 05/21/15 2046  COLORURINE YELLOW  LABSPEC 1.005  PHURINE 6.0  GLUCOSEU NEGATIVE  HGBUR TRACE*  BILIRUBINUR NEGATIVE  KETONESUR NEGATIVE  PROTEINUR NEGATIVE  NITRITE POSITIVE*  LEUKOCYTESUR SMALL*   Misc. Labs:   Micro Results: No results found for this or any previous visit (from the past 240 hour(s)). Studies/Results: Ct Angio Head W/cm &/or Wo Cm  05/21/2015   ADDENDUM REPORT: 05/21/2015 18:26 ADDENDUM: Study discussed by telephone with Dr. Roland Rack on 05/21/2015 at 1820 hours. Electronically Signed   By: Genevie Ann M.D.   On: 05/21/2015 18:26  05/21/2015  CLINICAL DATA:  70 year old male with altered mental status, confusion. Initial encounter. EXAM: CT ANGIOGRAPHY HEAD AND NECK TECHNIQUE: Multidetector CT imaging of the head and neck was performed using the standard protocol during bolus administration of intravenous contrast. Multiplanar CT image reconstructions and MIPs were obtained to evaluate the vascular anatomy. Carotid stenosis measurements (when applicable) are obtained utilizing NASCET criteria, using the distal internal carotid diameter as the denominator. CONTRAST:  74m OMNIPAQUE IOHEXOL 350 MG/ML SOLN COMPARISON:  Head CT without contrast 1012 hours today. FINDINGS: CTA NECK Skeleton: Absent dentition. Degenerative changes in the cervical spine, especially the upper cervical facets on the right. No acute osseous abnormality identified. Mild paranasal sinus mucosal thickening or mucous retention cyst. Other neck: Negative lung apices. No superior mediastinal lymphadenopathy. Small volume retained fluid in the thoracic esophagus. Thyroid, larynx, pharynx, parapharyngeal spaces, retropharyngeal space, sublingual space, submandibular glands, and parotid glands are within normal limits. No cervical lymphadenopathy. Visualized orbit soft tissues are within normal limits. Aortic arch: 3 vessel arch configuration with extensive predominantly soft atherosclerotic plaque at the arch and involving the great vessel origins. Calcified plaque in the distal arch. Right carotid system: No brachiocephalic artery or right CCA origin stenosis despite soft plaque. Soft plaque continues along the lateral right CCA proximally, and decreases above the level of the thyroid. There is a small area of ulcerated plaque at the level of the larynx anteriorly (series 41, image  57). At the right carotid bifurcation there is bulky calcified and soft plaque resulting in high-grade stenosis approaching a radiographic string sign (series 402, image 149 and series 403, image 132). Despite this the right ICA remains patent. No additional stenosis to the skullbase. Left carotid system: Soft plaque at the left CCA origin resulting in stenosis up to 55-60 % with respect to the distal vessel. Circumferential soft plaque again becomes severe in the distal left CCA just proximal to the bifurcation with high-grade stenosis there are (series 401, image 72), and continuing to the left ICA origin (radiographic string sign stenosis at the left ICA origin. Series 402, image 149 and series 404, image 158). Despite this the cervical left ICA remains patent, but the left ICA siphon is occluded as detailed below. Vertebral arteries: Adherent plaque or thrombus in the proximal left subclavian artery best seen on series 403, image 151. No high-grade proximal left subclavian stenosis results. The left vertebral artery origin is normal. Mild irregularity of the cervical left vertebral artery without definite stenosis. No proximal right subclavian artery stenosis. Mild if any right vertebral  artery origin stenosis related to soft plaque. Occasional calcified plaque and irregularity in the right V2 segment without additional stenosis. CTA HEAD Posterior circulation: Codominant distal vertebral arteries with mild V4 segment irregularity, not hemodynamically significant. Both PICA origins are within normal limits. Patent vertebrobasilar junction. No basilar artery stenosis. SCA and PCA origins are normal. Bilateral PCA branches are within normal limits. Posterior communicating arteries are diminutive or absent. Anterior circulation: Right ICA siphon is patent with mild irregularity and no stenosis. A diminutive right posterior communicating artery is visible on series 404, image 112. There is a small infundibulum at  its origin. The right ICA terminus is normal. Right MCA and ACA origins are normal. The right MCA M1 segment is patent but irregular with up to mild stenosis. Right MCA bifurcation and right MCA branches otherwise are within normal limits. Right ACA A1 segment anterior communicating artery, and right ACA branches are within normal limits. The left ICA siphon is occluded in the distal cavernous segment and at the anterior genu with irregular cavernous filling defect (series 404, image 132). There is poor reconstituted flow at the left ICA terminus, and in the left MCA M1 segment. The left ACA appears satisfactorily reconstituted from the anterior communicating artery. However, the distal left MCA M2 and M3 branches demonstrate better collateral flow (see series 405, image 19). Venous sinuses: Patent. Anatomic variants: None. Delayed phase: No significant additional cortically based infarct in the left MCA territory since 1013 hours today. The left basal ganglia, insula, and operculum primarily appear affected as before. No associated hemorrhage or mass effect. No abnormal enhancement identified. IMPRESSION: 1. 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 Poorly reconstituted flow in the left MCA M1 segment, but more satisfactory distal left MCA collaterals. 2. 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 3. Fairly extensive soft plaque at the great vessel origins and in the neck in general. Adherent soft plaque or thrombus in the proximal left subclavian artery without significant stenosis. 4. No vertebral artery or posterior circulation stenosis. 5. No significant change in left basal ganglia and small cortically based infarcts in the left insula and operculum since 1013 hours today. No associated hemorrhage or mass effect. Electronically Signed: By: Genevie Ann M.D. On: 05/21/2015 18:14   Ct Head Wo Contrast  05/21/2015  CLINICAL DATA:  Altered mental status,  nonverbal EXAM: CT HEAD WITHOUT CONTRAST TECHNIQUE: Contiguous axial images were obtained from the base of the skull through the vertex without contrast. COMPARISON:  None FINDINGS: Ill-defined wedge-shaped hypodensity involving the left insular cortex, external capsule, basal ganglia, and posterior limb of the left internal capsule. This extends into the coronal radiata/periventricular white matter, image 18. Appearance is compatible with an acute to subacute infarct. No associated hemorrhage, significant mass effect or edema. No midline shift. Ventricles remain symmetric. No hydrocephalus. Diffuse age-related brain atrophy. No extra-axial fluid collection. Cisterns remain patent. Cerebellar atrophy as well. Orbits are symmetric. Skull appears intact. Mastoids are clear. Retention cyst in the left maxillary sinus inferiorly measuring 15 mm. Similar small retention cyst in the left frontal sinus. Other sinuses remain clear. IMPRESSION: Wedge-shaped hypodensity involving the left insular cortex, external capsule, adjacent basal ganglia, and posterior limb of the internal capsule extending into the deep white matter compatible with an acute to subacute infarct. No associated intracranial hemorrhage, mass effect, or midline shift. Age related brain atrophy These results were called by telephone at the time of interpretation on 05/21/2015 at 10:33 am  to [facility], [facility] , who verbally acknowledged these results. Electronically Signed   By: Jerilynn Mages.  Shick M.D.   On: 05/21/2015 10:35   Ct Angio Neck W/cm &/or Wo/cm  05/21/2015  ADDENDUM REPORT: 05/21/2015 18:26 ADDENDUM: Study discussed by telephone with Dr. Roland Rack on 05/21/2015 at 1820 hours. Electronically Signed   By: Genevie Ann M.D.   On: 05/21/2015 18:26  05/21/2015  CLINICAL DATA:  70 year old male with altered mental status, confusion. Initial encounter. EXAM: CT ANGIOGRAPHY HEAD AND NECK TECHNIQUE: Multidetector CT imaging of the head and neck was performed  using the standard protocol during bolus administration of intravenous contrast. Multiplanar CT image reconstructions and MIPs were obtained to evaluate the vascular anatomy. Carotid stenosis measurements (when applicable) are obtained utilizing NASCET criteria, using the distal internal carotid diameter as the denominator. CONTRAST:  45m OMNIPAQUE IOHEXOL 350 MG/ML SOLN COMPARISON:  Head CT without contrast 1012 hours today. FINDINGS: CTA NECK Skeleton: Absent dentition. Degenerative changes in the cervical spine, especially the upper cervical facets on the right. No acute osseous abnormality identified. Mild paranasal sinus mucosal thickening or mucous retention cyst. Other neck: Negative lung apices. No superior mediastinal lymphadenopathy. Small volume retained fluid in the thoracic esophagus. Thyroid, larynx, pharynx, parapharyngeal spaces, retropharyngeal space, sublingual space, submandibular glands, and parotid glands are within normal limits. No cervical lymphadenopathy. Visualized orbit soft tissues are within normal limits. Aortic arch: 3 vessel arch configuration with extensive predominantly soft atherosclerotic plaque at the arch and involving the great vessel origins. Calcified plaque in the distal arch. Right carotid system: No brachiocephalic artery or right CCA origin stenosis despite soft plaque. Soft plaque continues along the lateral right CCA proximally, and decreases above the level of the thyroid. There is a small area of ulcerated plaque at the level of the larynx anteriorly (series 41, image 57). At the right carotid bifurcation there is bulky calcified and soft plaque resulting in high-grade stenosis approaching a radiographic string sign (series 402, image 149 and series 403, image 132). Despite this the right ICA remains patent. No additional stenosis to the skullbase. Left carotid system: Soft plaque at the left CCA origin resulting in stenosis up to 55-60 % with respect to the distal  vessel. Circumferential soft plaque again becomes severe in the distal left CCA just proximal to the bifurcation with high-grade stenosis there are (series 401, image 72), and continuing to the left ICA origin (radiographic string sign stenosis at the left ICA origin. Series 402, image 149 and series 404, image 158). Despite this the cervical left ICA remains patent, but the left ICA siphon is occluded as detailed below. Vertebral arteries: Adherent plaque or thrombus in the proximal left subclavian artery best seen on series 403, image 151. No high-grade proximal left subclavian stenosis results. The left vertebral artery origin is normal. Mild irregularity of the cervical left vertebral artery without definite stenosis. No proximal right subclavian artery stenosis. Mild if any right vertebral artery origin stenosis related to soft plaque. Occasional calcified plaque and irregularity in the right V2 segment without additional stenosis. CTA HEAD Posterior circulation: Codominant distal vertebral arteries with mild V4 segment irregularity, not hemodynamically significant. Both PICA origins are within normal limits. Patent vertebrobasilar junction. No basilar artery stenosis. SCA and PCA origins are normal. Bilateral PCA branches are within normal limits. Posterior communicating arteries are diminutive or absent. Anterior circulation: Right ICA siphon is patent with mild irregularity and no stenosis. A diminutive right posterior communicating artery is visible on series 404,  image 112. There is a small infundibulum at its origin. The right ICA terminus is normal. Right MCA and ACA origins are normal. The right MCA M1 segment is patent but irregular with up to mild stenosis. Right MCA bifurcation and right MCA branches otherwise are within normal limits. Right ACA A1 segment anterior communicating artery, and right ACA branches are within normal limits. The left ICA siphon is occluded in the distal cavernous segment  and at the anterior genu with irregular cavernous filling defect (series 404, image 132). There is poor reconstituted flow at the left ICA terminus, and in the left MCA M1 segment. The left ACA appears satisfactorily reconstituted from the anterior communicating artery. However, the distal left MCA M2 and M3 branches demonstrate better collateral flow (see series 405, image 19). Venous sinuses: Patent. Anatomic variants: None. Delayed phase: No significant additional cortically based infarct in the left MCA territory since 1013 hours today. The left basal ganglia, insula, and operculum primarily appear affected as before. No associated hemorrhage or mass effect. No abnormal enhancement identified. IMPRESSION: 1. 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 Poorly reconstituted flow in the left MCA M1 segment, but more satisfactory distal left MCA collaterals. 2. 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 3. Fairly extensive soft plaque at the great vessel origins and in the neck in general. Adherent soft plaque or thrombus in the proximal left subclavian artery without significant stenosis. 4. No vertebral artery or posterior circulation stenosis. 5. No significant change in left basal ganglia and small cortically based infarcts in the left insula and operculum since 1013 hours today. No associated hemorrhage or mass effect. Electronically Signed: By: Genevie Ann M.D. On: 05/21/2015 18:14   [name] Contrast  05/22/2015  CLINICAL DATA:  Aphasic and altered mental status, RIGHT sided weakness and RIGHT facial droop. History of smoking, stroke. Known carotid stenosis and LEFT ICA occlusion. EXAM: MRI HEAD WITHOUT CONTRAST TECHNIQUE: Multiplanar, multiecho pulse sequences of the brain and surrounding structures were obtained without intravenous contrast. COMPARISON:  CT head March 19, 2016 FINDINGS: Subcentimeter focus of reduced diffusion LEFT posterior  temporal cortex. Patchy reduced diffuse in LEFT temporal lobe/insula and, RIGHT basal ganglia. Additional subcentimeter foci of reduced diffusion LEFT frontal and parietal lobes. Associated low ADC values and mild FLAIR T2 hyperintense signal. 3 mm LEFT-to-RIGHT midline shift.No susceptibility artifact to suggest hemorrhage. Ventricles and sulci are normal for patient's age.  No mass lesions. No abnormal extra-axial fluid collections. Loss of LEFT carotid terminus flow void with poor flow void LEFT middle cerebral artery corresponding to angiographic abnormality. Ocular globes and orbital contents are unremarkable. Mild paranasal sinus mucosal thickening with LEFT maxillary sinus mucosal retention cyst. Mastoid air cells are well aerated. No abnormal sellar expansion. No cerebellar tonsillar ectopia. No suspicious calvarial bone marrow signal. Patient is edentulous. IMPRESSION: Multifocal moderate acute LEFT middle cerebral artery territory infarct without hemorrhagic conversion. Slow flow versus occluded LEFT carotid terminus and MCA corresponding to known angiographic abnormality. Electronically Signed   By: Elon Alas M.D.   On: 05/22/2015 04:52   Medications:  Scheduled Meds: . antiseptic oral rinse  7 mL Mouth Rinse q12n4p  . aspirin  300 mg Rectal Daily   Or  . aspirin  325 mg Oral Daily  . cefTRIAXone (ROCEPHIN)  IV  1 g Intravenous Q24H  . chlorhexidine  15 mL Mouth Rinse BID  . enoxaparin (LOVENOX) injection  40 mg Subcutaneous Q24H  .  nicotine  21 mg Transdermal Daily   Continuous Infusions: . dextrose 5 % and 0.45% NaCl 50 mL/hr at 05/22/15 0624   PRN Meds:.RESOURCE THICKENUP CLEAR Assessment/Plan: Active Problems:   CVA (cerebral infarction)   Acute CVA (cerebrovascular accident) ([place])  70 year old man presenting from home with altered mental status found to have CVA on CT and MRI imaging with continued right sided deficits, expressive aphasia, and some receptive  aphasia.  Cerebrovascular Accident: risk factors include tobacco abuse history, possible OSA from son's report of nighttime snoring and body habitus.  CT head confirmed a left insular cortex wedge shape hypodensity.  CTA head and neck with LEFT ICA occlusion, bilateral high-grade ICA origin stenosis, fairly extensive soft plaque at the great vessel origins and in the neck in general.  Adherent soft plaque in the proximal LEFT subclavian artery without significant stenosis.  MRI with multifocal moderate acute LEFT middle cerebral artery territory infarct without hemorrhagic conversion. - Neurology following appreciate recs - continue Aspirin '300mg'$  PR or '325mg'$  PO - allow permissive HTN, normalize BP in 5-7 days - UDS negative, urinalysis as below - TTE pending - CT, CTA, MRI results above - lipid panel with TC 191, HDL 26, LDL 136 >> start Lipitor '80mg'$  - A1C pending - PT/OT evals pending - SLP recommending Dysphagia 1 Nectar thick liquids and inpatient  - neuro checks - monitor on telemetry  Urinary Tract Infection: urinalysis obtained at admission with cloudy appearance, trace Hgb, positive nitrites, small leukocytes.  Microscopy with too numerous to count WBC, 0-5 RBC, many bacteria.  Patient unable to provide reliable information as to whether having symptoms.  No report from family of BPH-like symptoms - follow up culture - Ceftriaxone 1gm IV q24h x 7 days  Tobacco Abuse: significant history of smoking 3 PPD x 50 years - Nicotine patch - Smoking cessation  FEN Fluids: none Electrolytes: replace if needed Nutrition: Dysphagia 1  DVT PPx: Lovenox  CODE: Full  Dispo: Disposition is deferred at this time, awaiting improvement of current medical problems.  Anticipated discharge in approximately 2-3 day(s).    The patient does not have a current PCP (No primary care provider on file.) and does need an [facility] hospital follow-up appointment after discharge.  The patient does not have  transportation limitations that hinder transportation to clinic appointments.  .Services Needed at time of discharge: Y = Yes, Blank = No PT:   OT:   RN:   Equipment:   Other:  SLP    LOS: 1 day   Jule Ser, DO 05/22/2015, 11:05 AM

## 2015-05-22 NOTE — H&P (Signed)
Internal Medicine Attending Admission Note Date: 05/22/2015  Patient name: Jonathan Bowen Medical record number: [id number] Date of birth: November 06, 1945 Age: 70 y.o. Gender: male  I saw and evaluated the patient. I reviewed the resident's note and I agree with the resident's findings and plan as documented in the resident's note.  Chief Complaint(s): Right sided facial droop, right-sided weakness, and expressive aphasia 1 day.  History - key components related to admission:  Jonathan Bowen is a 70 year old man with a history of tobacco abuse and obesity who presents to the emergency department with a one-day history of right sided facial droop, right-sided weakness, and expressive aphasia. He has not seen a doctor in several decades. Therefore, his past medical history is limited or undiagnosed. He was admitted to the internal medicine teaching service for further evaluation and care.  Physical Exam - key components related to admission:  Filed Vitals:   05/22/15 0345 05/22/15 0638 05/22/15 1049 05/22/15 1426  BP: 184/75 155/65 194/83 113/65  Pulse: 72 75 79 80  Temp: 98.4 F (36.9 C) 98.8 F (37.1 C) 100.4 F (38 C) 98.6 F (37 C)  TempSrc: Oral Oral Axillary Oral  Resp: '18  18 24  '$ Height:      Weight:      SpO2: 96% 92% 92%    Gen.: Well-developed, well-nourished, obese man lying comfortably in bed in no acute distress. He is somewhat fidgety and moving his left hand back-and-forth.  Eyes: Xanthelasma. Neuro: Although he does not have right sided neglect he prefers peering to the left. He does not consistently follow instructions or answer questions appropriately although on occasion he demonstrates insight. For example, Dr. Juleen China mentioned that he wanted to look at his eyes. The patient spontaneously took off his glasses. For most questions he responds "I don't know". Strength is 4/5 in the right upper and lower extremities and 5/5 in the left upper and lower extremities. No xanthomas over  the extensor surfaces of the upper extremities bilaterally.  Lab results:  Basic Metabolic Panel:  Recent Labs  05/21/15 0918 05/21/15 0928  NA 143 143  K 4.2 4.2  CL 108 108  CO2 22  --   GLUCOSE 118* 115*  BUN 14 17  CREATININE 1.06 0.90  CALCIUM 9.4  --    Liver Function Tests:  Recent Labs  05/21/15 0918  AST 17  ALT 16*  ALKPHOS 56  BILITOT 0.6  PROT 6.5  ALBUMIN 3.8   CBC:  Recent Labs  05/21/15 0918 05/21/15 0928  WBC 9.3  --   NEUTROABS 5.0  --   HGB 16.4 17.7*  HCT 49.1 52.0  MCV 91.3  --   PLT 181  --    CBG:  Recent Labs  05/21/15 1751 05/21/15 2000 05/22/15 0018 05/22/15 0345 05/22/15 0748  GLUCAP 87 81 97 120* 107*   Fasting Lipid Panel:  Recent Labs  05/22/15 0613  CHOL 191  HDL 26*  LDLCALC 136*  TRIG 143  CHOLHDL 7.3   Coagulation:  Recent Labs  05/21/15 0918  INR 1.05   Urine Drug Screen:  Unremarkable  Alcohol Level:  Recent Labs  05/21/15 1002  ETH <5   Urinalysis:  Cloudy, specific gravity 1.005, pH 6.0, hemoglobin trace, positive nitrite, small leukocytes, 0-5 red blood cells per high-power field, too numerous to count white blood cells per high-power field, many bacteria.  Misc. Labs:  Urine culture pending Hemoglobin A1c pending  Imaging results:  Ct Angio Head W/cm &/or Wo  Cm  05/21/2015  ADDENDUM REPORT: 05/21/2015 18:26 ADDENDUM: Study discussed by telephone with Dr. Roland Rack on 05/21/2015 at 1820 hours. Electronically Signed   By: Genevie Ann M.D.   On: 05/21/2015 18:26  05/21/2015  CLINICAL DATA:  70 year old male with altered mental status, confusion. Initial encounter. EXAM: CT ANGIOGRAPHY HEAD AND NECK TECHNIQUE: Multidetector CT imaging of the head and neck was performed using the standard protocol during bolus administration of intravenous contrast. Multiplanar CT image reconstructions and MIPs were obtained to evaluate the vascular anatomy. Carotid stenosis measurements (when  applicable) are obtained utilizing NASCET criteria, using the distal internal carotid diameter as the denominator. CONTRAST:  75m OMNIPAQUE IOHEXOL 350 MG/ML SOLN COMPARISON:  Head CT without contrast 1012 hours today. FINDINGS: CTA NECK Skeleton: Absent dentition. Degenerative changes in the cervical spine, especially the upper cervical facets on the right. No acute osseous abnormality identified. Mild paranasal sinus mucosal thickening or mucous retention cyst. Other neck: Negative lung apices. No superior mediastinal lymphadenopathy. Small volume retained fluid in the thoracic esophagus. Thyroid, larynx, pharynx, parapharyngeal spaces, retropharyngeal space, sublingual space, submandibular glands, and parotid glands are within normal limits. No cervical lymphadenopathy. Visualized orbit soft tissues are within normal limits. Aortic arch: 3 vessel arch configuration with extensive predominantly soft atherosclerotic plaque at the arch and involving the great vessel origins. Calcified plaque in the distal arch. Right carotid system: No brachiocephalic artery or right CCA origin stenosis despite soft plaque. Soft plaque continues along the lateral right CCA proximally, and decreases above the level of the thyroid. There is a small area of ulcerated plaque at the level of the larynx anteriorly (series 41, image 57). At the right carotid bifurcation there is bulky calcified and soft plaque resulting in high-grade stenosis approaching a radiographic string sign (series 402, image 149 and series 403, image 132). Despite this the right ICA remains patent. No additional stenosis to the skullbase. Left carotid system: Soft plaque at the left CCA origin resulting in stenosis up to 55-60 % with respect to the distal vessel. Circumferential soft plaque again becomes severe in the distal left CCA just proximal to the bifurcation with high-grade stenosis there are (series 401, image 72), and continuing to the left ICA origin  (radiographic string sign stenosis at the left ICA origin. Series 402, image 149 and series 404, image 158). Despite this the cervical left ICA remains patent, but the left ICA siphon is occluded as detailed below. Vertebral arteries: Adherent plaque or thrombus in the proximal left subclavian artery best seen on series 403, image 151. No high-grade proximal left subclavian stenosis results. The left vertebral artery origin is normal. Mild irregularity of the cervical left vertebral artery without definite stenosis. No proximal right subclavian artery stenosis. Mild if any right vertebral artery origin stenosis related to soft plaque. Occasional calcified plaque and irregularity in the right V2 segment without additional stenosis. CTA HEAD Posterior circulation: Codominant distal vertebral arteries with mild V4 segment irregularity, not hemodynamically significant. Both PICA origins are within normal limits. Patent vertebrobasilar junction. No basilar artery stenosis. SCA and PCA origins are normal. Bilateral PCA branches are within normal limits. Posterior communicating arteries are diminutive or absent. Anterior circulation: Right ICA siphon is patent with mild irregularity and no stenosis. A diminutive right posterior communicating artery is visible on series 404, image 112. There is a small infundibulum at its origin. The right ICA terminus is normal. Right MCA and ACA origins are normal. The right MCA M1 segment is patent but  irregular with up to mild stenosis. Right MCA bifurcation and right MCA branches otherwise are within normal limits. Right ACA A1 segment anterior communicating artery, and right ACA branches are within normal limits. The left ICA siphon is occluded in the distal cavernous segment and at the anterior genu with irregular cavernous filling defect (series 404, image 132). There is poor reconstituted flow at the left ICA terminus, and in the left MCA M1 segment. The left ACA appears  satisfactorily reconstituted from the anterior communicating artery. However, the distal left MCA M2 and M3 branches demonstrate better collateral flow (see series 405, image 19). Venous sinuses: Patent. Anatomic variants: None. Delayed phase: No significant additional cortically based infarct in the left MCA territory since 1013 hours today. The left basal ganglia, insula, and operculum primarily appear affected as before. No associated hemorrhage or mass effect. No abnormal enhancement identified. IMPRESSION: 1. 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 Poorly reconstituted flow in the left MCA M1 segment, but more satisfactory distal left MCA collaterals. 2. 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 3. Fairly extensive soft plaque at the great vessel origins and in the neck in general. Adherent soft plaque or thrombus in the proximal left subclavian artery without significant stenosis. 4. No vertebral artery or posterior circulation stenosis. 5. No significant change in left basal ganglia and small cortically based infarcts in the left insula and operculum since 1013 hours today. No associated hemorrhage or mass effect. Electronically Signed: By: Genevie Ann M.D. On: 05/21/2015 18:14   Ct Head Wo Contrast  05/21/2015  CLINICAL DATA:  Altered mental status, nonverbal EXAM: CT HEAD WITHOUT CONTRAST TECHNIQUE: Contiguous axial images were obtained from the base of the skull through the vertex without contrast. COMPARISON:  None FINDINGS: Ill-defined wedge-shaped hypodensity involving the left insular cortex, external capsule, basal ganglia, and posterior limb of the left internal capsule. This extends into the coronal radiata/periventricular white matter, image 18. Appearance is compatible with an acute to subacute infarct. No associated hemorrhage, significant mass effect or edema. No midline shift. Ventricles remain symmetric. No hydrocephalus. Diffuse  age-related brain atrophy. No extra-axial fluid collection. Cisterns remain patent. Cerebellar atrophy as well. Orbits are symmetric. Skull appears intact. Mastoids are clear. Retention cyst in the left maxillary sinus inferiorly measuring 15 mm. Similar small retention cyst in the left frontal sinus. Other sinuses remain clear. IMPRESSION: Wedge-shaped hypodensity involving the left insular cortex, external capsule, adjacent basal ganglia, and posterior limb of the internal capsule extending into the deep white matter compatible with an acute to subacute infarct. No associated intracranial hemorrhage, mass effect, or midline shift. Age related brain atrophy These results were called by telephone at the time of interpretation on 05/21/2015 at 10:33 am to [facility], [facility] , who verbally acknowledged these results. Electronically Signed   By: Jerilynn Mages.  Shick M.D.   On: 05/21/2015 10:35   Ct Angio Neck W/cm &/or Wo/cm  05/21/2015  ADDENDUM REPORT: 05/21/2015 18:26 ADDENDUM: Study discussed by telephone with Dr. Roland Rack on 05/21/2015 at 1820 hours. Electronically Signed   By: Genevie Ann M.D.   On: 05/21/2015 18:26  05/21/2015  CLINICAL DATA:  70 year old male with altered mental status, confusion. Initial encounter. EXAM: CT ANGIOGRAPHY HEAD AND NECK TECHNIQUE: Multidetector CT imaging of the head and neck was performed using the standard protocol during bolus administration of intravenous contrast. Multiplanar CT image reconstructions and MIPs were obtained to evaluate the vascular anatomy. Carotid stenosis measurements (  when applicable) are obtained utilizing NASCET criteria, using the distal internal carotid diameter as the denominator. CONTRAST:  35m OMNIPAQUE IOHEXOL 350 MG/ML SOLN COMPARISON:  Head CT without contrast 1012 hours today. FINDINGS: CTA NECK Skeleton: Absent dentition. Degenerative changes in the cervical spine, especially the upper cervical facets on the right. No acute osseous abnormality  identified. Mild paranasal sinus mucosal thickening or mucous retention cyst. Other neck: Negative lung apices. No superior mediastinal lymphadenopathy. Small volume retained fluid in the thoracic esophagus. Thyroid, larynx, pharynx, parapharyngeal spaces, retropharyngeal space, sublingual space, submandibular glands, and parotid glands are within normal limits. No cervical lymphadenopathy. Visualized orbit soft tissues are within normal limits. Aortic arch: 3 vessel arch configuration with extensive predominantly soft atherosclerotic plaque at the arch and involving the great vessel origins. Calcified plaque in the distal arch. Right carotid system: No brachiocephalic artery or right CCA origin stenosis despite soft plaque. Soft plaque continues along the lateral right CCA proximally, and decreases above the level of the thyroid. There is a small area of ulcerated plaque at the level of the larynx anteriorly (series 41, image 57). At the right carotid bifurcation there is bulky calcified and soft plaque resulting in high-grade stenosis approaching a radiographic string sign (series 402, image 149 and series 403, image 132). Despite this the right ICA remains patent. No additional stenosis to the skullbase. Left carotid system: Soft plaque at the left CCA origin resulting in stenosis up to 55-60 % with respect to the distal vessel. Circumferential soft plaque again becomes severe in the distal left CCA just proximal to the bifurcation with high-grade stenosis there are (series 401, image 72), and continuing to the left ICA origin (radiographic string sign stenosis at the left ICA origin. Series 402, image 149 and series 404, image 158). Despite this the cervical left ICA remains patent, but the left ICA siphon is occluded as detailed below. Vertebral arteries: Adherent plaque or thrombus in the proximal left subclavian artery best seen on series 403, image 151. No high-grade proximal left subclavian stenosis  results. The left vertebral artery origin is normal. Mild irregularity of the cervical left vertebral artery without definite stenosis. No proximal right subclavian artery stenosis. Mild if any right vertebral artery origin stenosis related to soft plaque. Occasional calcified plaque and irregularity in the right V2 segment without additional stenosis. CTA HEAD Posterior circulation: Codominant distal vertebral arteries with mild V4 segment irregularity, not hemodynamically significant. Both PICA origins are within normal limits. Patent vertebrobasilar junction. No basilar artery stenosis. SCA and PCA origins are normal. Bilateral PCA branches are within normal limits. Posterior communicating arteries are diminutive or absent. Anterior circulation: Right ICA siphon is patent with mild irregularity and no stenosis. A diminutive right posterior communicating artery is visible on series 404, image 112. There is a small infundibulum at its origin. The right ICA terminus is normal. Right MCA and ACA origins are normal. The right MCA M1 segment is patent but irregular with up to mild stenosis. Right MCA bifurcation and right MCA branches otherwise are within normal limits. Right ACA A1 segment anterior communicating artery, and right ACA branches are within normal limits. The left ICA siphon is occluded in the distal cavernous segment and at the anterior genu with irregular cavernous filling defect (series 404, image 132). There is poor reconstituted flow at the left ICA terminus, and in the left MCA M1 segment. The left ACA appears satisfactorily reconstituted from the anterior communicating artery. However, the distal left MCA M2 and M3  branches demonstrate better collateral flow (see series 405, image 19). Venous sinuses: Patent. Anatomic variants: None. Delayed phase: No significant additional cortically based infarct in the left MCA territory since 1013 hours today. The left basal ganglia, insula, and operculum  primarily appear affected as before. No associated hemorrhage or mass effect. No abnormal enhancement identified. IMPRESSION: 1. 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 Poorly reconstituted flow in the left MCA M1 segment, but more satisfactory distal left MCA collaterals. 2. 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 3. Fairly extensive soft plaque at the great vessel origins and in the neck in general. Adherent soft plaque or thrombus in the proximal left subclavian artery without significant stenosis. 4. No vertebral artery or posterior circulation stenosis. 5. No significant change in left basal ganglia and small cortically based infarcts in the left insula and operculum since 1013 hours today. No associated hemorrhage or mass effect. Electronically Signed: By: Genevie Ann M.D. On: 05/21/2015 18:14   [name] Contrast  05/22/2015  CLINICAL DATA:  Aphasic and altered mental status, RIGHT sided weakness and RIGHT facial droop. History of smoking, stroke. Known carotid stenosis and LEFT ICA occlusion. EXAM: MRI HEAD WITHOUT CONTRAST TECHNIQUE: Multiplanar, multiecho pulse sequences of the brain and surrounding structures were obtained without intravenous contrast. COMPARISON:  CT head March 19, 2016 FINDINGS: Subcentimeter focus of reduced diffusion LEFT posterior temporal cortex. Patchy reduced diffuse in LEFT temporal lobe/insula and, RIGHT basal ganglia. Additional subcentimeter foci of reduced diffusion LEFT frontal and parietal lobes. Associated low ADC values and mild FLAIR T2 hyperintense signal. 3 mm LEFT-to-RIGHT midline shift.No susceptibility artifact to suggest hemorrhage. Ventricles and sulci are normal for patient's age.  No mass lesions. No abnormal extra-axial fluid collections. Loss of LEFT carotid terminus flow void with poor flow void LEFT middle cerebral artery corresponding to angiographic abnormality. Ocular globes and  orbital contents are unremarkable. Mild paranasal sinus mucosal thickening with LEFT maxillary sinus mucosal retention cyst. Mastoid air cells are well aerated. No abnormal sellar expansion. No cerebellar tonsillar ectopia. No suspicious calvarial bone marrow signal. Patient is edentulous. IMPRESSION: Multifocal moderate acute LEFT middle cerebral artery territory infarct without hemorrhagic conversion. Slow flow versus occluded LEFT carotid terminus and MCA corresponding to known angiographic abnormality. Electronically Signed   By: Elon Alas M.D.   On: 05/22/2015 04:52   Other results:  EKG: Normal sinus rhythm at 81 bpm, normal axis, normal intervals, no significant Q waves, no LVH by voltage, no ST or T-wave changes. No comparisons available.  Assessment & Plan by Problem:  Mr. 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 He is left with some mild right-sided upper and lower extremity weakness but more concerning and devastating a expressive aphasia. He is an excellent candidate for inpatient rehabilitation. From a risk factor standpoint smoking cessation will be key and he will be provided with a nicotine patch as we attempt to wean him off of tobacco. Although intervention into the left internal carotid artery from a surgical standpoint is unlikely to be beneficial given the occlusion he may benefit from a right carotid endarterectomy given his patent circle of Willis.  1)  Left MCA infarct: With residual right upper and lower extremity weakness and expressive aphasia. Neurology has provided recommendations to include aspirin 325 mg by mouth daily. We are waiting the results of the  hemoglobin A1c. He is a candidate for statin therapy. We will also aggressively address the smoking risk factor. This will include a nicotine patch and counseling. Once he he is past the acute phase of his stroke we will begin to address his hypertension. On Monday we will consult vascular surgery with the specific question of whether or not the patient may be a candidate for a right carotid endarterectomy given the string sign on the right and his patent circle of Willis in the setting of an acute stroke. Although this artery is opposite the side of the CVA it may have contributed to his stroke. Thus, he deserves assessment of whether he may or may not be a surgical candidate at some point to correct this right internal carotid artery stenosis. We are waiting final determination of his candidacy for inpatient rehabilitation. If he is not a candidate we will need to search for other rehabilitation facilities for discharge.  2) Disposition: Pending evaluation of candidacy for inpatient rehabilitation.

## 2015-05-22 NOTE — Care Management Obs Status (Signed)
MEDICARE OBSERVATION STATUS NOTIFICATION   Patient Details  Name: Jonathan Bowen MRN: [id number] Date of Birth: May 08, 1945   Medicare Observation Status Notification Given:  Yes  Pt was OBS from 05/21/15 at 14:05 through 05/22/15 at 14:05 and C44 given.  Dellie Catholic, RN 05/22/2015, 4:35 PM

## 2015-05-23 ENCOUNTER — Inpatient Hospital Stay (HOSPITAL_COMMUNITY): Payer: Medicare Other

## 2015-05-23 DIAGNOSIS — I6789 Other cerebrovascular disease: Secondary | ICD-10-CM

## 2015-05-23 DIAGNOSIS — B9689 Other specified bacterial agents as the cause of diseases classified elsewhere: Secondary | ICD-10-CM

## 2015-05-23 LAB — BASIC METABOLIC PANEL
Anion gap: 9 (ref 5–15)
BUN: 20 mg/dL (ref 6–20)
CHLORIDE: 111 mmol/L (ref 101–111)
CO2: 23 mmol/L (ref 22–32)
Calcium: 8.9 mg/dL (ref 8.9–10.3)
Creatinine, Ser: 1.04 mg/dL (ref 0.61–1.24)
GFR calc Af Amer: >60 mL/min (ref >60)
GLUCOSE: 121 mg/dL — AB (ref 65–99)
POTASSIUM: 4.1 mmol/L (ref 3.5–5.1)
Sodium: 143 mmol/L (ref 135–145)

## 2015-05-23 LAB — CBC
HCT: 46.3 % (ref 39.0–52.0)
Hemoglobin: 15.2 g/dL (ref 13.0–17.0)
MCH: 30.1 pg (ref 26.0–34.0)
MCHC: 32.8 g/dL (ref 30.0–36.0)
MCV: 91.7 fL (ref 78.0–100.0)
PLATELETS: 171 10*3/uL (ref 150–400)
RBC: 5.05 MIL/uL (ref 4.22–5.81)
RDW: 14.1 % (ref 11.5–15.5)
WBC: 8.7 10*3/uL (ref 4.0–10.5)

## 2015-05-23 MED ORDER — PERFLUTREN LIPID MICROSPHERE
1.0000 mL | INTRAVENOUS | Status: AC | PRN
  Administered 2015-05-23: 2 mL via INTRAVENOUS
  Filled 2015-05-23: qty 10

## 2015-05-23 NOTE — Progress Notes (Signed)
  Echocardiogram 2D Echocardiogram with Definity has been performed.  Jonathan Bowen 05/23/2015, 9:55 AM

## 2015-05-23 NOTE — Progress Notes (Signed)
Patient ID: Jonathan Bowen, male   DOB: 07/18/1945, 70 y.o.   MRN: [id number]   Subjective: [name] continues to have expressive and receptive aphasia and is non-conversant. His son tells me he slept well overnight and was not complaining of any pain. He did have a low grade fever of 100.4 overnight.  Objective: Vital signs in last 24 hours: Filed Vitals:   05/22/15 1426 05/22/15 2141 05/23/15 0218 05/23/15 0542  BP: 113/65 139/70 173/68 155/74  Pulse: 80 78 70 82  Temp: 98.6 F (37 C) 98.7 F (37.1 C) 98.4 F (36.9 C) 98.5 F (36.9 C)  TempSrc: Oral Oral Oral Oral  Resp: '24 18 16 20  '$ Height:      Weight:      SpO2:  94% 94% 92%   Physical exam: General: overweight man with thick neck, quite groggy and unable to converse with me Cardiac: regular rate and rhythm, no rubs, murmurs or gallops Pulm: breathing well, clear to auscultation bilaterally Abd: obese but non-tender Ext: warm and well perfused, without pedal edema Neuro: receptive and expressive aphasia, able to lift his right arm but I can't fully assess his strength as he doesn't cooperate with exam  Lab Results: Basic Metabolic Panel:  Recent Labs Lab 05/21/15 0918 05/21/15 0928 05/23/15 0615  NA 143 143 143  K 4.2 4.2 4.1  CL 108 108 111  CO2 22  --  23  GLUCOSE 118* 115* 121*  BUN '14 17 20  '$ CREATININE 1.06 0.90 1.04  CALCIUM 9.4  --  8.9   Studies/Results:  CTA head and neck: IMPRESSION: 1. 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 Poorly reconstituted flow in the left MCA M1 segment, but more satisfactory distal left MCA collaterals. 2. 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 3. Fairly extensive soft plaque at the great vessel origins and in the neck in general. Adherent soft plaque or thrombus in the proximal left subclavian artery without significant stenosis. 4. No vertebral artery or posterior circulation stenosis. 5. No significant  change in left basal ganglia and small cortically based infarcts in the left insula and operculum since 1013 hours today. No associated hemorrhage or mass effect. Electronically Signed: By: Genevie Ann M.D. On: 05/21/2015 18:14   [name] Contrast  IMPRESSION: Multifocal moderate acute LEFT middle cerebral artery territory infarct without hemorrhagic conversion. Slow flow versus occluded LEFT carotid terminus and MCA corresponding to known angiographic abnormality. Electronically Signed   By: Elon Alas M.D.   On: 05/22/2015 04:52   Medications: I have reviewed the patient's current medications. Scheduled Meds: . antiseptic oral rinse  7 mL Mouth Rinse q12n4p  . aspirin  300 mg Rectal Daily   Or  . aspirin  325 mg Oral Daily  . atorvastatin  80 mg Oral q1800  . cefTRIAXone (ROCEPHIN)  IV  1 g Intravenous Q24H  . chlorhexidine  15 mL Mouth Rinse BID  . enoxaparin (LOVENOX) injection  40 mg Subcutaneous Q24H  . nicotine  21 mg Transdermal Daily  . 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 He also has significant bilateral carotid artery disease on CTA; he may be a candidate for an endarterectomy. We'll ask vascular surgery to evaluate him tomorrow. Smoking is  his largest risk factor so we'll hone our focus on that. -Thank for your help Neurology -Continue aspirin '300mg'$  suppository daily -Call vascular surgery tomorrow -Follow K4E  Complicated urinary tract infection: He spiked a low grade fever of 100.4 overnight but his vitals otherwise looked good. I wonder if some of his encephalopathy is from infection. We'll follow cultures and continue ceftriaxone for now. -Continue ceftriaxone 1g IV daily -Follow urine cultures  Tobacco abuse: He was  smoking 2-3 packs per day. We've discussed the importance of smoking cessation to his family and will continue nicotine patch upon discharge. -Continue nicotine patch '21mg'$  daily  Hyperlipidemia: His LDL was 136. We'll start a statin once he can swallow safely. -Will start atorvastatin '80mg'$  daily once he can swallow  Hypertension: Pressures 150s-170s overnight. We'll continue allowing permissive hypertension.  Dispo: Disposition is deferred at this time, awaiting improvement of current medical problems.  Anticipated discharge in approximately 2-4 day(s).   The patient does not have a current PCP (No primary care provider on file.) and does need an [facility] hospital follow-up appointment after discharge.  The patient does have transportation limitations that hinder transportation to clinic appointments.  .Services Needed at time of discharge: Y = Yes, Blank = No PT:   OT:   RN:   Equipment:   Other:     LOS: 2 days   Loleta Chance, MD 05/23/2015, 8:53 AM

## 2015-05-23 NOTE — Progress Notes (Signed)
STROKE TEAM PROGRESS NOTE   HISTORY AT THE TIME OF ADMISSION Nathanuel Cabreja is an 70 y.o. male who does smoke "alot per family" but has not seen a MD and thus has no PMHx. Per family cholesterol and DM are in the family. Yesterday he was noted to not speak to the son and seemed not to be able to recognize him. This AM daughter noted he again was not speaking and seemed confused. Due to her noting a facial droop and some weakness on the right side he was brought to ED. CT head confirmed a left insular cortex wedge shape hypodensity. Currently he is both expressive and receptive aphasic. HE has a right facial droop and right sided weakness.  Per family he did take a ASA daily.  Date last known well: 2.2.2017 Time last known well:Time: 16:30 tPA Given: no out of window  SUBJECTIVE (INTERVAL HISTORY) His daughter was at the bedside.  I reviewed the MRI with her and discussed the planned work-up.  Overall he is unable to articulate how he feels his condition has changed.  No events overnight.  Difficult to complete full ROS due to aphasia.  Patient did deny pain  OBJECTIVE Temp:  [98.4 F (36.9 C)-100.4 F (38 C)] 98.5 F (36.9 C) (02/05 0542) Pulse Rate:  [65-82] 65 (02/05 0938) Cardiac Rhythm:  [-] Normal sinus rhythm (02/05 0900) Resp:  [16-24] 20 (02/05 0542) BP: (113-194)/(54-83) 154/62 mmHg (02/05 0938) SpO2:  [92 %-94 %] 92 % (02/05 0542)  CBC:   Recent Labs Lab 05/21/15 0918 05/21/15 0928 05/23/15 0615  WBC 9.3  --  8.7  NEUTROABS 5.0  --   --   HGB 16.4 17.7* 15.2  HCT 49.1 52.0 46.3  MCV 91.3  --  91.7  PLT 181  --  812    Basic Metabolic Panel:   Recent Labs Lab 05/21/15 0918 05/21/15 0928 05/23/15 0615  NA 143 143 143  K 4.2 4.2 4.1  CL 108 108 111  CO2 22  --  23  GLUCOSE 118* 115* 121*  BUN '14 17 20  '$ CREATININE 1.06 0.90 1.04  CALCIUM 9.4  --  8.9    Lipid Panel:     Component Value Date/Time   CHOL 191 05/22/2015 0613   TRIG 143 05/22/2015 0613    HDL 26* 05/22/2015 0613   CHOLHDL 7.3 05/22/2015 0613   VLDL 29 05/22/2015 0613   LDLCALC 136* 05/22/2015 0613    HgbA1c: No results found for: HGBA1C Urine Drug Screen:     Component Value Date/Time   LABOPIA NONE DETECTED 05/21/2015 2046   COCAINSCRNUR NONE DETECTED 05/21/2015 2046   LABBENZ NONE DETECTED 05/21/2015 2046   AMPHETMU NONE DETECTED 05/21/2015 2046   THCU NONE DETECTED 05/21/2015 2046   LABBARB NONE DETECTED 05/21/2015 2046      IMAGING  Ct Angio Head and Neck W/cm &/or Wo Cm 05/21/2015   1. 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 Poorly reconstituted flow in the left MCA M1 segment, but more satisfactory distal left MCA collaterals.  2. 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  3. Fairly extensive soft plaque at the great vessel origins and in the neck in general. Adherent soft plaque or thrombus in the proximal left subclavian artery without significant stenosis.  4. No vertebral artery or posterior circulation stenosis.  5. No significant change in left basal ganglia and small cortically based infarcts in the left insula and operculum  since 1013 hours today. No associated hemorrhage or mass effect.    Ct Head Wo Contrast 05/21/2015   Wedge-shaped hypodensity involving the left insular cortex, external capsule, adjacent basal ganglia, and posterior limb of the internal capsule extending into the deep white matter compatible with an acute to subacute infarct. No associated intracranial hemorrhage, mass effect, or midline shift. Age related brain atrophy   [name] Contrast 05/22/2015   Multifocal moderate acute LEFT middle cerebral artery territory infarct without hemorrhagic conversion. Slow flow versus occluded LEFT carotid terminus and MCA corresponding to known angiographic abnormality.   PHYSICAL EXAM  HEENT- Normocephalic, no lesions, without obvious abnormality. Normal external eye and  conjunctiva. Normal external nose, mucus membranes and septum.  Cardiovascular- S1, S2 normal, pulses palpable throughout  Lungs- chest clear, no wheezing, rales, normal symmetric air entry Abdomen- normal findings: bowel sounds normal Extremities- no edema  Neurological Examination Mental Status: More awake, receptive and expressive aphasia. Has less difficulty mimicking visual commands today. Does state name but does not answer other orientation questions  Cranial Nerves: II: Visual fields grossly normal, pupils equal, round, reactive to light and accommodation III,IV, VI: extra-ocular motions intact bilaterally V,VII: bilateral corneals VIII: hearing grossly intact IX,X: uvula rises symmetrically XI: mimics shrug.  Weaker on the right XII: opens mouth to command but does not protrude tongue   Motor: Right :Upper extremity 3+/5Left: Upper extremity 5/5 Lower extremity 3+/5Lower extremity 5/5  Sensory: withdraws to pain bilaterally  Cerebellar/Gait: not tested  ASSESSMENT/PLAN Mr. Hunt Zajicek is a 70 y.o. male with history of tobacco use and no regular medical care prior to admission presenting with receptive and expressive aphasia with right hemiparesis. He did not receive IV t-PA due to late presentation.  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  Resultant  Right sided weakness and aphasia  MRI - Multifocal moderate acute LEFT middle cerebral artery territory infarct without hemorrhagic conversion.  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  Carotid Doppler - refer to CTA  2D Echo - pending  LDL - 136  HgbA1c pending  VTE prophylaxis - Lovenox DIET - DYS 1 Room service appropriate?: Yes; Fluid  consistency:: Nectar Thick  aspirin 81 mg daily prior to admission, now on aspirin 300 mg suppository daily  Ongoing aggressive stroke risk factor management  Therapy recommendations: Inpatient Rehabilitation recommended. Screening ordered.  Disposition: Pending  Hypertension  Blood pressure tends to be elevated  Permissive hypertension (OK if < 220/120) but gradually normalize in 5-7 days  Hyperlipidemia  Home meds: 136 resumed in hospital  LDL 136, goal < 70  Now on Lipitor 80 mg daily.  Continue statin at discharge  Other Stroke Risk Factors  Advanced age  Cigarette smoker, advised to stop smoking  Obesity, Body mass index is 35.67 kg/(m^2).   Other Active Problems  No regular medical care prior to admission.  Hospital day # 2  Mikey Bussing [facility] Pager [phone] 05/23/2015, 10:33 AM  ATTENDING NOTE: Patient was seen and examined by me personally. Documentation reflects findings. The laboratory and radiographic studies reviewed by me.  ROS pertinent positives could not be fully documented due to aphasia  Condition: Stable  Assessment and plan completed by me personally and fully documented above. Plans/Recommendations include:     Stroke work-up ongoing  Continue statin  Continue ASA  SIGNED  BY: Dr. Elissa Hefty     To contact Stroke Continuity provider, please refer to [email or web address] After hours, contact General Neurology

## 2015-05-23 NOTE — Progress Notes (Signed)
Internal Medicine Attending  Date: 05/23/2015  Patient name: Jonathan Bowen Medical record number: [id number] Date of birth: 1945/06/10 Age: 70 y.o. Gender: male  I saw and evaluated the patient. I reviewed the resident's note by Dr. Melburn Hake and I agree with the resident's findings and plans as documented in his progress note.  Mr. Hence slept well overnight. His exam is slightly improved from the day before in that he is able to clearly say "hello" and his upper and lower extremity strength are 4+/5 today. It also appears that his right facial droop is slightly less than yesterday. He will be evaluated for candidacy for inpatient rehabilitation tomorrow and vascular surgery will be consulted with a specific question of whether or not he would be a candidate for a carotid endarterectomy at some point given the string sign on the opposite internal carotid artery. At this point we are continuing aspirin 325 mg daily and allowing permissive hypertension.

## 2015-05-23 NOTE — Progress Notes (Signed)
OT Cancellation Note  Patient Details Name: Jonathan Bowen MRN: [id number] DOB: 1945/11/15   Cancelled Treatment:    Reason Eval/Treat Not Completed: Pt on bed rest, per orders. Please update activity orders when pt appropriate for OT evaluation.  Benito Mccreedy OTR/L [phone] 05/23/2015, 2:55 PM

## 2015-05-24 DIAGNOSIS — I639 Cerebral infarction, unspecified: Secondary | ICD-10-CM | POA: Insufficient documentation

## 2015-05-24 DIAGNOSIS — I69320 Aphasia following cerebral infarction: Secondary | ICD-10-CM | POA: Insufficient documentation

## 2015-05-24 DIAGNOSIS — Z720 Tobacco use: Secondary | ICD-10-CM

## 2015-05-24 DIAGNOSIS — I69359 Hemiplegia and hemiparesis following cerebral infarction affecting unspecified side: Secondary | ICD-10-CM

## 2015-05-24 DIAGNOSIS — I69391 Dysphagia following cerebral infarction: Secondary | ICD-10-CM

## 2015-05-24 DIAGNOSIS — I6521 Occlusion and stenosis of right carotid artery: Secondary | ICD-10-CM

## 2015-05-24 DIAGNOSIS — I63312 Cerebral infarction due to thrombosis of left middle cerebral artery: Principal | ICD-10-CM

## 2015-05-24 DIAGNOSIS — R7303 Prediabetes: Secondary | ICD-10-CM

## 2015-05-24 DIAGNOSIS — N390 Urinary tract infection, site not specified: Secondary | ICD-10-CM | POA: Insufficient documentation

## 2015-05-24 DIAGNOSIS — I10 Essential (primary) hypertension: Secondary | ICD-10-CM

## 2015-05-24 LAB — BASIC METABOLIC PANEL
ANION GAP: 11 (ref 5–15)
BUN: 20 mg/dL (ref 6–20)
CHLORIDE: 107 mmol/L (ref 101–111)
CO2: 23 mmol/L (ref 22–32)
Calcium: 9 mg/dL (ref 8.9–10.3)
Creatinine, Ser: 1.03 mg/dL (ref 0.61–1.24)
GFR calc non Af Amer: >60 mL/min (ref >60)
Glucose, Bld: 112 mg/dL — ABNORMAL HIGH (ref 65–99)
Potassium: 4.5 mmol/L (ref 3.5–5.1)
Sodium: 141 mmol/L (ref 135–145)

## 2015-05-24 LAB — HEMOGLOBIN A1C
Hgb A1c MFr Bld: 5.8 % — ABNORMAL HIGH (ref 4.8–5.6)
MEAN PLASMA GLUCOSE: 120 mg/dL

## 2015-05-24 MED ORDER — ATORVASTATIN CALCIUM 80 MG PO TABS: 80.0000 mg | ORAL_TABLET | Freq: Every day | ORAL | Status: DC

## 2015-05-24 MED ORDER — SULFAMETHOXAZOLE-TRIMETHOPRIM 800-160 MG PO TABS: 1.0000 | ORAL_TABLET | Freq: Two times a day (BID) | ORAL | Status: DC

## 2015-05-24 MED ORDER — CLOPIDOGREL BISULFATE 75 MG PO TABS
75.0000 mg | ORAL_TABLET | Freq: Every day | ORAL | Status: DC
  Administered 2015-05-24: 75 mg via ORAL
  Filled 2015-05-24: qty 1

## 2015-05-24 MED ORDER — AMLODIPINE BESYLATE 5 MG PO TABS: 5.0000 mg | ORAL_TABLET | Freq: Every day | ORAL | Status: DC

## 2015-05-24 MED ORDER — CLOPIDOGREL BISULFATE 75 MG PO TABS: 75.0000 mg | ORAL_TABLET | Freq: Every day | ORAL | Status: DC

## 2015-05-24 MED ORDER — NICOTINE 21 MG/24HR TD PT24: 21.0000 mg | MEDICATED_PATCH | Freq: Every day | TRANSDERMAL | Status: DC

## 2015-05-24 NOTE — Consult Note (Signed)
[facility] NOTE   MRN : [id number]  Reason for Consult: right ICA stenosis, left occluded  Referring Physician: Jule Ser  History of Present Illness: 70 y/o male with left CVA resulting in right facial droop,expressive aphasia  and right UE and LE weakness.  Son is with him and states he is walking with a rolling walker and assistance.  He does not visit MD's on a regular basis.  Past medical history is unknown.  A CTA was ordered and reports: left ICA occluded and high grade stenosis on the right ICA.  We are consulted for RICA stenosis.       Current Facility-Administered Medications  Medication Dose Route Frequency Provider Last Rate Last Dose  . antiseptic oral rinse (CPC / CETYLPYRIDINIUM CHLORIDE 0.05%) solution 7 mL  7 mL Mouth Rinse q12n4p Oval Linsey, MD   7 mL at 05/23/15 1200  . aspirin suppository 300 mg  300 mg Rectal Daily Francesca Oman, DO       Or  . aspirin tablet 325 mg  325 mg Oral Daily Francesca Oman, DO   325 mg at 05/24/15 1050  . atorvastatin (LIPITOR) tablet 80 mg  80 mg Oral q1800 Jule Ser, DO   80 mg at 05/23/15 2033  . cefTRIAXone (ROCEPHIN) 1 g in dextrose 5 % 50 mL IVPB  1 g Intravenous Q24H Erenest Blank, RPH   1 g at 05/24/15 0550  . chlorhexidine (PERIDEX) 0.12 % solution 15 mL  15 mL Mouth Rinse BID Oval Linsey, MD   15 mL at 05/24/15 1050  . enoxaparin (LOVENOX) injection 40 mg  40 mg Subcutaneous Q24H Francesca Oman, DO   40 mg at 05/23/15 2033  . nicotine (NICODERM CQ - dosed in mg/24 hours) patch 21 mg  21 mg Transdermal Daily Jule Ser, DO   21 mg at 05/24/15 1050  . Chase   Oral PRN Oval Linsey, MD        Pt meds include: Statin :No Betablocker: No ASA: No Other anticoagulants/antiplatelets: none  Past Medical History  Diagnosis Date  . Smoker     History reviewed. No pertinent past surgical history.  Social History Social History  Substance Use Topics   . Smoking status: Current Every Day Smoker -- 3.00 packs/day  . Smokeless tobacco: None  . Alcohol Use: No    Family History Family History  Problem Relation Age of Onset  . Diabetes Mother   . Diabetes Father   . Hypertension Mother   . Hypertension Father   . Hyperlipidemia Father   . Hyperlipidemia Mother     No Known Allergies   REVIEW OF SYSTEMS  General: '[ ]'$  Weight loss, '[ ]'$  Fever, '[ ]'$  chills Neurologic: '[ ]'$  Dizziness, '[ ]'$  Blackouts, '[ ]'$  Seizure '[ ]'$  Stroke, '[ ]'$  "Mini stroke", '[ ]'$  Slurred speech, '[ ]'$  Temporary blindness; [x ] weakness in arms or legs, '[ ]'$  Hoarseness '[ ]'$  Dysphagia Cardiac: '[ ]'$  Chest pain/pressure, '[ ]'$  Shortness of breath at rest '[ ]'$  Shortness of breath with exertion, '[ ]'$  Atrial fibrillation or irregular heartbeat  Vascular: '[ ]'$  Pain in legs with walking, '[ ]'$  Pain in legs at rest, '[ ]'$  Pain in legs at night,  '[ ]'$  Non-healing ulcer, '[ ]'$  Blood clot in vein/DVT,   Pulmonary: '[ ]'$  Home oxygen, '[ ]'$  Productive cough, '[ ]'$  Coughing up blood, '[ ]'$  Asthma,  '[ ]'$  Wheezing '[ ]'$  COPD  Musculoskeletal:  '[ ]'$  Arthritis, '[ ]'$  Low back pain, '[ ]'$  Joint pain Hematologic: '[ ]'$  Easy Bruising, '[ ]'$  Anemia; '[ ]'$  Hepatitis Gastrointestinal: '[ ]'$  Blood in stool, '[ ]'$  Gastroesophageal Reflux/heartburn, Urinary: '[ ]'$  chronic Kidney disease, '[ ]'$  on HD - '[ ]'$  MWF or '[ ]'$  TTHS, '[ ]'$  Burning with urination, '[ ]'$  Difficulty urinating Skin: '[ ]'$  Rashes, '[ ]'$  Wounds Psychological: '[ ]'$  Anxiety, '[ ]'$  Depression  Physical Examination Filed Vitals:   05/24/15 0100 05/24/15 0203 05/24/15 0441 05/24/15 1058  BP: 170/66 166/62 150/97   Pulse: 75 76 76 88  Temp: 99 F (37.2 C) 98.3 F (36.8 C) 98.7 F (37.1 C)   TempSrc: Oral Oral Oral   Resp: '18 18 20   '$ Height:      Weight:      SpO2: 94% 91% 93%    Body mass index is 35.67 kg/(m^2).  General:  WDWN in NAD HENT: WNL Eyes: Pupils equal Pulmonary: normal non-labored breathing , without Rales, rhonchi,  wheezing Cardiac: RRR, without  Murmurs, rubs or  gallops; No carotid bruits Abdomen: soft, NT, no masses Skin: no rashes, ulcers noted;  no Gangrene , no cellulitis; no open wounds;   Vascular Exam/Pulses:2+ radial pulses, femoral pulses.  Non palpable distal pulses.  No ulcers or open wounds.   Musculoskeletal: no muscle wasting or atrophy; no edema  Neurologic: A&O X 3; Appropriate Affect ;  SENSATION: normal; MOTOR FUNCTION: 5/5 on Left, 4/5 right LE, 3-4/5 right UE Speech ith severe aphasia. Can occasionally get out words., positive right facial droop   Significant Diagnostic Studies: CBC Lab Results  Component Value Date   WBC 8.7 05/23/2015   HGB 15.2 05/23/2015   HCT 46.3 05/23/2015   MCV 91.7 05/23/2015   PLT 171 05/23/2015    BMET    Component Value Date/Time   NA 141 05/24/2015 0803   K 4.5 05/24/2015 0803   CL 107 05/24/2015 0803   CO2 23 05/24/2015 0803   GLUCOSE 112* 05/24/2015 0803   BUN 20 05/24/2015 0803   CREATININE 1.03 05/24/2015 0803   CALCIUM 9.0 05/24/2015 0803   GFRNONAA >60 05/24/2015 0803   GFRAA >60 05/24/2015 0803   Estimated Creatinine Clearance: 74 mL/min (by C-G formula based on Cr of 1.03).  COAG Lab Results  Component Value Date   INR 1.05 05/21/2015     Non-Invasive Vascular Imaging:  CTA Right carotid system: No brachiocephalic artery or right CCA origin stenosis despite soft plaque. Soft plaque continues along the lateral right CCA proximally, and decreases above the level of the thyroid. There is a small area of ulcerated plaque at the level of the larynx anteriorly (series 41, image 57).  At the right carotid bifurcation there is bulky calcified and soft plaque resulting in high-grade stenosis approaching a radiographic string sign (series 402, image 149 and series 403, image 132). Despite this the right ICA remains patent. No additional stenosis to the skullbase.  Left carotid system: Soft plaque at the left CCA origin resulting in stenosis up to 55-60 % with  respect to the distal vessel. Circumferential soft plaque again becomes severe in the distal left CCA just proximal to the bifurcation with high-grade stenosis there are (series 401, image 72), and continuing to the left ICA origin (radiographic string sign stenosis at the left ICA origin. Series 402, image 149 and series 404, image 158). Despite this the cervical left ICA remains patent, but the left ICA siphon is occluded  ASSESSMENT/PLAN:  Left CVA with left ICA occlusion.  Right high grade ICA stenosis. We will plan on following his progress here, if he is discharge we will see him in our office in 2 weeks to plan right CEA with Dr. Donnetta Hutching.    Laurence Slate [facility] 05/24/2015 11:50 AM  I have examined the patient, reviewed and agree with above.discussed with the patient and son present. Son requested that I call the sister, Gaelen Brager [phone].I called and left a voicemail. She has a symptomatic internal carotid artery occlusion at the siphon. Bifurcation is high-grade stenosis on the left with the distal inclusion. On the right carotid he has a high-grade carotid stenosis at the bifurcation. Explained that no need for urgent treatment. Would recommend continued recovery from his left brain stroke with physical therapy and rehabilitation. Would recommend right carotid endarterectomy for his high-grade stenosis with contralateral left carotid occlusion.  As long as he continues his recovery, would recommend this in the 3-4 weeks. We'll make plans to see him in 2-3 weeks in the office if he is discharged prior to that.  Curt Jews, MD 05/24/2015 12:56 PM

## 2015-05-24 NOTE — Progress Notes (Signed)
Physical Therapy Treatment Patient Details Name: Jonathan Bowen MRN: [id number] DOB: 1945-06-22 Today's Date: 05/24/2015    History of Present Illness pt is a 70 y/o male with h/o tobacco abuse admitte to ED with AMS.  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    PT Comments    Pt demonstrated increased gt distance with impulsivity noted through out tx.  Pt required cues for safety and assist to correct LOB post transfers.    Follow Up Recommendations  CIR;Supervision/Assistance - 24 hour     Equipment Recommendations  Other (comment) (TBD)    Recommendations for Other Services Rehab consult     Precautions / Restrictions Precautions Precautions: Fall Restrictions Weight Bearing Restrictions: No    Mobility  Bed Mobility   Bed Mobility: Supine to Sit     Supine to sit: Min guard     General bed mobility comments: Pt requires increased time and multiple attempts to complete supine to sit at edge of bed.  Pt required cues for hand placement to use rail vs. pulling on PTA.  HOB elevated to 20 degrees during bed mobility.    Transfers Overall transfer level: Needs assistance Equipment used: Rolling walker (2 wheeled) Transfers: Sit to/from Stand Sit to Stand: Mod assist (initially to ascend min guard but demonstrates strong LOB req mod assist to correct LOB.  )         General transfer comment: assist to steady balance in standing.  Pt impulsive and required cues to correct technqiue to improve safety.  pt demonstrates urinary incont in standing.    Ambulation/Gait Ambulation/Gait assistance: Min assist;Mod assist Ambulation Distance (Feet): 15 Feet (x2, 140 ft for 3rd trial.  ) Assistive device: Rolling walker (2 wheeled) Gait Pattern/deviations: Step-through pattern;Decreased step length - right;Decreased stride length;Decreased weight shift to right;Decreased dorsiflexion - right (weakness in R hand  grip requiring assist to position RW during turns and forward ambulation.  )     General Gait Details: Pt ER of RLE, required frequent cues for RW position and safety, gt unsteady.  Cues for R heel strike and weight shifting in stance phase.  Pt remains unaware of deficits and impulsive with mobility.     Stairs            Wheelchair Mobility    Modified Rankin (Stroke Patients Only)       Balance Overall balance assessment: Needs assistance Sitting-balance support: Feet supported Sitting balance-Leahy Scale: Fair       Standing balance-Leahy Scale: Poor Standing balance comment: posterior LOB in standing.                      Cognition Arousal/Alertness: Awake/alert Behavior During Therapy: Impulsive Overall Cognitive Status: Impaired/Different from baseline Area of Impairment: Attention;Following commands;Safety/judgement;Awareness;Problem solving       Following Commands: Follows one step commands inconsistently Safety/Judgement: Decreased awareness of deficits;Decreased awareness of safety (constant cues for pacing to maintain safety.  ) Awareness: Intellectual Problem Solving: Requires tactile cues;Slow processing      Exercises      General Comments        Pertinent Vitals/Pain Pain Assessment: No/denies pain    Home Living                      Prior Function            PT Goals (current goals can now be found in the  care plan section) Acute Rehab PT Goals Potential to Achieve Goals: Good Progress towards PT goals: Progressing toward goals    Frequency  Min 4X/week    PT Plan      Co-evaluation             End of Session Equipment Utilized During Treatment: Gait belt Activity Tolerance: Patient tolerated treatment well Patient left: in chair;with call bell/phone within reach;with family/visitor present;with chair alarm set     Time: 1610-9604 PT Time Calculation (min) (ACUTE ONLY): 26 min  Charges:  $Gait  Training: 8-22 mins $Therapeutic Activity: 8-22 mins                    G Codes:      Cristela Blue 06/05/2015, 11:08 AM  Governor Rooks, PTA pager [phone]

## 2015-05-24 NOTE — Consult Note (Signed)
Physical Medicine and Rehabilitation Consult Reason for Consult: Left MCA territory infarct Referring Physician: Dr. Bradd Burner   HPI: Jonathan Bowen is a 70 y.o. right handed male with history of tobacco abuse. Presented 05/21/2015 with right sided weakness and aphasia. History obtained per chart review shows patient lives with son. Independent prior to admission. One level home with 5 steps to entry. Son works during the day. He also has a daughter in the area that works. MRI of the brain showed multifocal moderate acute left middle cerebral artery territory infarct without hemorrhagic conversion. 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 Echocardiogram with ejection fraction 62% grade 1 diastolic dysfunction. Patient did not receive TPA. Neurology consulted placed on aspirin for CVA prophylaxis. Subcutaneous Lovenox for DVT prophylaxis. Nicoderm patch initiated for tobacco abuse. Urine study positive nitrite initially placed on Rocephin. Dysphagia #1 nectar thick liquid diet. Physical therapy evaluation completed 05/22/2015 with recommendations of physical medicine rehabilitation consult.  Review of Systems  Unable to perform ROS: language   Past Medical History  Diagnosis Date  . Smoker    History reviewed. No pertinent past surgical history. Family History  Problem Relation Age of Onset  . Diabetes Mother   . Diabetes Father   . Hypertension Mother   . Hypertension Father   . Hyperlipidemia Father   . Hyperlipidemia Mother    Social History:  reports that he has been smoking.  He does not have any smokeless tobacco history on file. He reports that he does not drink alcohol or use illicit drugs. Allergies: No Known Allergies Medications Prior to Admission  Medication Sig Dispense Refill  . aspirin EC 81 MG tablet Take 81 mg by mouth daily.      Home: Home  Living Family/patient expects to be discharged to:: Private residence Living Arrangements: Children (son who works days) Available Help at Discharge: Family, Available PRN/intermittently Type of Home: House Home Access: Stairs to enter Technical brewer of Steps: 5 Entrance Stairs-Rails: None Home Layout: One level Bathroom Shower/Tub: Chiropodist: [place]: None  Lives With: Son  Functional History: Prior Function Level of Independence: Independent Functional Status:  Mobility: Bed Mobility General bed mobility comments: sitting on the side of bed on arrive, alarm sounding Transfers Overall transfer level: Needs assistance Transfers: Sit to/from Stand, Stand Pivot Transfers Sit to Stand: Mod assist Stand pivot transfers: Mod assist General transfer comment: assist to come forward and stability assist.  R LE used paretically to pivot. Ambulation/Gait General Gait Details: NT    ADL:    Cognition: Cognition Overall Cognitive Status: Impaired/Different from baseline Arousal/Alertness: Awake/alert Orientation Level: Disoriented X4 (expressive asphia) Attention: Sustained Sustained Attention: Impaired Sustained Attention Impairment: Verbal basic, Functional basic Memory:  (will assess further) Awareness: Impaired Awareness Impairment: Emergent impairment, Anticipatory impairment Problem Solving: Impaired Problem Solving Impairment: Functional basic Executive Function: Self Monitoring, Self Correcting Self Monitoring: Impaired Self Correcting: Impaired Behaviors: Impulsive, Perseveration Safety/Judgment: Impaired Cognition Arousal/Alertness: Awake/alert Behavior During Therapy: Restless Overall Cognitive Status: Impaired/Different from baseline Area of Impairment: Attention, Following commands, Safety/judgement, Awareness, Problem solving Current Attention Level: Focused Following Commands: Follows one step commands  inconsistently Safety/Judgement: Decreased awareness of deficits, Decreased awareness of safety Awareness: Intellectual Problem Solving: Requires tactile cues, Slow processing  Blood pressure 150/97, pulse 76, temperature 98.7 F (37.1 C), temperature source Oral, resp. rate 20, height '5\' 6"'$  (1.676 m), weight  100.2 kg (220 lb 14.4 oz), SpO2 93 %. Physical Exam  Vitals reviewed. Constitutional: He appears well-developed and well-nourished.  HENT:  Head: Normocephalic and atraumatic.  Eyes: Conjunctivae and EOM are normal.  Neck: Normal range of motion. Neck supple. No thyromegaly present.  Cardiovascular: Normal rate and regular rhythm.   Respiratory: Effort normal and breath sounds normal. No respiratory distress.  GI: Soft. Bowel sounds are normal. He exhibits no distension.  Musculoskeletal: He exhibits no edema or tenderness.  Neurological: He is alert. He has normal reflexes.  Makes eye contact with examiner.  Expressive > receptive aphasia.  He was able to provide some yes and no's but inconsistent.  Also inconsistent to some one-step commands Sensation:? Intact light touch Motor RUE:? 4/5 proximal distal,  LUE/RLE/LLE: ?5/5  Skin: Skin is warm and dry.  Psychiatric: His affect is blunt. He is slowed. Cognition and memory are impaired.    No results found for this or any previous visit (from the past 24 hour(s)). No results found.  Assessment/Plan: Diagnosis: Left MCA territory infarct Labs and images independently reviewed.  Records reviewed and summated above. Stroke: Continue secondary stroke prophylaxis and Risk Factor Modification listed below:   Antiplatelet therapy:   Blood Pressure Management:  Continue current medication with prn's with permisive HTN per primary team Statin Agent:   Tobacco abuse:  Cont to counsel Right sided hemiparesis   1. Does the need for close, 24 hr/day medical supervision in concert with the patient's rehab needs make it unreasonable  for this patient to be served in a less intensive setting? Potentially 2. Co-Morbidities requiring supervision/potential complications: Dysphagia (cont SLP), aphasia (Cont SLP), tobacco abuse (cont to counsel), HTN (monitor and provide prns in accordance with increased physical exertion and pain), UTI (cont meds) 3. Due to skin/wound care, disease management, medication administration and patient education, does the patient require 24 hr/day rehab nursing? Potentially 4. Does the patient require coordinated care of a physician, rehab nurse, PT (1-2 hrs/day, 5 days/week), OT (1-2 hrs/day, 5 days/week) and SLP (1-2 hrs/day, 5 days/week) to address physical and functional deficits in the context of the above medical diagnosis(es)? Potentially Addressing deficits in the following areas: balance, endurance, locomotion, strength, transferring, toileting, cognition, speech, language, swallowing and psychosocial support 5. Can the patient actively participate in an intensive therapy program of at least 3 hrs of therapy per day at least 5 days per week? Yes 6. The potential for patient to make measurable gains while on inpatient rehab is excellent 7. Anticipated functional outcomes upon discharge from inpatient rehab are n/a  with PT, n/a with OT, n/a with SLP. 8. Estimated rehab length of stay to reach the above functional goals is: N/A 9. Does the patient have adequate social supports and living environment to accommodate these discharge functional goals? Potentially 10. Anticipated D/C setting: Other 11. Anticipated post D/C treatments: HH therapy and Home excercise program 12. Overall Rehab/Functional Prognosis: good  RECOMMENDATIONS: This patient's condition is appropriate for continued rehabilitative care in the following setting: Pt making gains on acute floor with mainly deficits in speech.  Wound anticipate pt will cont to make functional gain.  Unfortunately, pt does not have a medical need to  warrant an IRF admission.  Would recommend SNF if family is unable to able to care for pt 24/7. Patient has agreed to participate in recommended program. Potentially Note that insurance prior authorization may be required for reimbursement for recommended care.  Comment: Rehab Admissions Coordinator to follow up.  Delice Lesch, MD 05/24/2015

## 2015-05-24 NOTE — Care Management Important Message (Signed)
Important Message  Patient Details  Name: Jonathan Bowen MRN: [id number] Date of Birth: 1945-07-16   Medicare Important Message Given:  Yes    Louanne Belton 05/24/2015, 11:29 AMImportant Message  Patient Details  Name: Jonathan Bowen MRN: [id number] Date of Birth: 09-25-1945   Medicare Important Message Given:  Yes    Tanea Moga G 05/24/2015, 11:29 AM

## 2015-05-24 NOTE — Progress Notes (Signed)
CSW consulted regarding SNF placement. Patient does not meet 3-night inpatient status for Medicare to approve SNF placement.   CSW signing off.  Percell Locus Jovee Dettinger LCSWA [phone]

## 2015-05-24 NOTE — Progress Notes (Signed)
I saw and examined patient and reviewed resident note by Dr. Juleen China and I agree with his findings and plan as outlined.

## 2015-05-24 NOTE — Progress Notes (Signed)
Patient discharge teaching given, including activity, diet, follow-up appoints, and medications. Patient verbalized understanding of all discharge instructions. IV access was d/c'd. Vitals are stable. Skin is intact except as charted in most recent assessments. Pt to be escorted out by NT, to be driven home by family. 

## 2015-05-24 NOTE — Progress Notes (Signed)
Speech Language Pathology Treatment: Dysphagia  Patient Details Name: Jonathan Bowen MRN: [id number] DOB: 1945-09-25 Today's Date: 05/24/2015 Time: 3244-0102 SLP Time Calculation (min) (ACUTE ONLY): 28 min  Assessment / Plan / Recommendation Clinical Impression  ST follow up for therapeutic diet tolerance.  Nursing reported no overt issues this morning while taking his pills crushed in puree.  However, she did report that the patient has been orally holding material.  The family confirmed this.  Chart review revealed that the patient has had one temp since initial evaluation.   Meal observation was completed.  The patient appeared to tolerate the purees, however oral holding was observed and the patient required verbal and tactile cues to swallow.  Thin liquids were trialed via tsp sips with immediate cough noted.  Same was see for cup sips of nectar.  Honey thick liquids were trialed and the patient appeared to perform better.  No cough/throat clear was seen.  The patient did continue to require cueing to swallow as well as max cues for pacing.  If cues not provided the patient tended to take multiple sips at a time.  Recommend a dysphagia 1 diet with honey thick liquids.  MBS is pending.  If it is unable to be completed as an inpatient please be sure that that patient has follow up as outpatient.  HHST has already been ordered by the MD.  ST will continue to follow until DC.     HPI HPI: 70 y.o. 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 No CXR or prior ST notes.       SLP Plan  Continue with current plan of care;MBS     Recommendations  Diet recommendations: Dysphagia 1 (puree);Honey-thick liquid Liquids provided via: Cup Medication  Administration: Crushed with puree Supervision: Full supervision/cueing for compensatory strategies Compensations: Minimize environmental distractions;Slow rate;Small sips/bites;Lingual sweep for clearance of pocketing Postural Changes and/or Swallow Maneuvers: Seated upright 90 degrees;Upright 30-60 min after meal             Oral Care Recommendations: Oral care BID Follow up Recommendations: Home health SLP Plan: Continue with current plan of care;[place], MA, CCC-SLP Acute Rehab SLP [phone] Lamar Sprinkles 05/24/2015, 2:38 PM

## 2015-05-24 NOTE — NC FL2 (Signed)
[place] LEVEL OF CARE SCREENING TOOL     IDENTIFICATION  Patient Name: Jonathan Bowen Birthdate: May 12, 1945 Sex: male Admission Date (Current Location): 05/21/2015  [facility] and [place] Number:  [employer] and Address:  [facility] [place], [place] [address] [place], [place] [address]      Provider Number: [id number]  Attending Physician Name and Address:  Thayer Headings, MD  Relative Name and Phone Number:  Lethea Killings, son, [phone]    Current Level of Care: Hospital Recommended Level of Care: [place] Prior Approval Number:    Date Approved/Denied:   PASRR Number:   [id number]   Discharge Plan: SNF    Current Diagnoses: Patient Active Problem List   Diagnosis Date Noted  . Hyperlipidemia   . Essential hypertension   . Tobacco abuse   . Morbid obesity due to excess calories ([place])   . CVA (cerebral infarction) 05/21/2015  . Acute CVA (cerebrovascular accident) ([place]) 05/21/2015    Orientation RESPIRATION BLADDER Height & Weight      (Disoriented)  Normal Incontinent Weight: 220 lb 14.4 oz (100.2 kg) Height:  '5\' 6"'$  (167.6 cm)  BEHAVIORAL SYMPTOMS/MOOD NEUROLOGICAL BOWEL NUTRITION STATUS     (Stroke) Continent  (Please see DC summary)  AMBULATORY STATUS COMMUNICATION OF NEEDS Skin   Extensive Assist Verbally Normal                       Personal Care Assistance Level of Assistance  Bathing, Feeding, Dressing Bathing Assistance: Maximum assistance Feeding assistance: Maximum assistance Dressing Assistance: Maximum assistance     Functional Limitations Info             SPECIAL CARE FACTORS FREQUENCY  PT (By licensed PT)     PT Frequency: 4x/week              Contractures      Additional Factors Info  Code Status, Allergies Code Status Info: Full Allergies Info: NKA           Current Medications (05/24/2015):  This is the current hospital active medication list Current  Facility-Administered Medications  Medication Dose Route Frequency Provider Last Rate Last Dose  . antiseptic oral rinse (CPC / CETYLPYRIDINIUM CHLORIDE 0.05%) solution 7 mL  7 mL Mouth Rinse q12n4p Oval Linsey, MD   7 mL at 05/23/15 1200  . aspirin suppository 300 mg  300 mg Rectal Daily Francesca Oman, DO       Or  . aspirin tablet 325 mg  325 mg Oral Daily Francesca Oman, DO   325 mg at 05/23/15 5885  . atorvastatin (LIPITOR) tablet 80 mg  80 mg Oral q1800 Jule Ser, DO   80 mg at 05/23/15 2033  . cefTRIAXone (ROCEPHIN) 1 g in dextrose 5 % 50 mL IVPB  1 g Intravenous Q24H Erenest Blank, RPH   1 g at 05/24/15 0550  . chlorhexidine (PERIDEX) 0.12 % solution 15 mL  15 mL Mouth Rinse BID Oval Linsey, MD   15 mL at 05/23/15 2145  . enoxaparin (LOVENOX) injection 40 mg  40 mg Subcutaneous Q24H Francesca Oman, DO   40 mg at 05/23/15 2033  . nicotine (NICODERM CQ - dosed in mg/24 hours) patch 21 mg  21 mg Transdermal Daily Jule Ser, DO   21 mg at 05/23/15 0947  . [place] Number Two   Oral PRN Oval Linsey, MD  Discharge Medications: Please see discharge summary for a list of discharge medications.  Relevant Imaging Results:  Relevant Lab Results:   Additional Information SSN: [place], [place]

## 2015-05-24 NOTE — Progress Notes (Signed)
STROKE TEAM PROGRESS NOTE   HISTORY AT THE TIME OF ADMISSION Jonathan Bowen is an 70 y.o. male who does smoke "alot per family" but has not seen a MD and thus has no PMHx. Per family cholesterol and DM are in the family. Yesterday he was noted to not speak to the son and seemed not to be able to recognize him. This AM daughter noted he again was not speaking and seemed confused. Due to her noting a facial droop and some weakness on the right side he was brought to ED. CT head confirmed a left insular cortex wedge shape hypodensity. Currently he is both expressive and receptive aphasic. HE has a right facial droop and right sided weakness.  Per family he did take a ASA daily.  Date last known well: 2.2.2017 Time last known well:Time: 16:30 tPA Given: no out of window  SUBJECTIVE (INTERVAL HISTORY) His son and wife were at the bedside.  I reviewed the MRI, carotid doppler,Ct angio with him and discussed the planned work-up.  Overall he is unable to articulate how he feels his condition has changed.  No events overnight.  OBJECTIVE Temp:  [98 F (36.7 C)-99 F (37.2 C)] 98.7 F (37.1 C) (02/06 0441) Pulse Rate:  [75-88] 88 (02/06 1058) Cardiac Rhythm:  [-] Normal sinus rhythm (02/06 0703) Resp:  [18-20] 20 (02/06 0441) BP: (150-170)/(62-97) 150/97 mmHg (02/06 0441) SpO2:  [91 %-97 %] 93 % (02/06 0441)  CBC:   Recent Labs Lab 05/21/15 0918 05/21/15 0928 05/23/15 0615  WBC 9.3  --  8.7  NEUTROABS 5.0  --   --   HGB 16.4 17.7* 15.2  HCT 49.1 52.0 46.3  MCV 91.3  --  91.7  PLT 181  --  008    Basic Metabolic Panel:   Recent Labs Lab 05/23/15 0615 05/24/15 0803  NA 143 141  K 4.1 4.5  CL 111 107  CO2 23 23  GLUCOSE 121* 112*  BUN 20 20  CREATININE 1.04 1.03  CALCIUM 8.9 9.0    Lipid Panel:     Component Value Date/Time   CHOL 191 05/22/2015 0613   TRIG 143 05/22/2015 0613   HDL 26* 05/22/2015 0613   CHOLHDL 7.3 05/22/2015 0613   VLDL 29 05/22/2015 0613   LDLCALC  136* 05/22/2015 0613    HgbA1c:  Lab Results  Component Value Date   HGBA1C 5.8* 05/22/2015   Urine Drug Screen:     Component Value Date/Time   LABOPIA NONE DETECTED 05/21/2015 2046   COCAINSCRNUR NONE DETECTED 05/21/2015 2046   LABBENZ NONE DETECTED 05/21/2015 2046   AMPHETMU NONE DETECTED 05/21/2015 2046   THCU NONE DETECTED 05/21/2015 2046   LABBARB NONE DETECTED 05/21/2015 2046      IMAGING  Ct Angio Head and Neck W/cm &/or Wo Cm 05/21/2015   1. 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 Poorly reconstituted flow in the left MCA M1 segment, but more satisfactory distal left MCA collaterals.  2. 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  3. Fairly extensive soft plaque at the great vessel origins and in the neck in general. Adherent soft plaque or thrombus in the proximal left subclavian artery without significant stenosis.  4. No vertebral artery or posterior circulation stenosis.  5. No significant change in left basal ganglia and small cortically based infarcts in the left insula and operculum since 1013 hours today. No associated hemorrhage or mass effect.    Ct  Head Wo Contrast 05/21/2015   Wedge-shaped hypodensity involving the left insular cortex, external capsule, adjacent basal ganglia, and posterior limb of the internal capsule extending into the deep white matter compatible with an acute to subacute infarct. No associated intracranial hemorrhage, mass effect, or midline shift. Age related brain atrophy   [name] Contrast 05/22/2015   Multifocal moderate acute LEFT middle cerebral artery territory infarct without hemorrhagic conversion. Slow flow versus occluded LEFT carotid terminus and MCA corresponding to known angiographic abnormality.   PHYSICAL EXAM  HEENT- Normocephalic, no lesions, without obvious abnormality. Normal external eye and conjunctiva. Normal external nose, mucus membranes and  septum.  Cardiovascular- S1, S2 normal, pulses palpable throughout  Lungs- chest clear, no wheezing, rales, normal symmetric air entry Abdomen- normal findings: bowel sounds normal Extremities- no edema  Neurological Examination Mental Status: More awake, receptive and expressive aphasia. Has less difficulty mimicking visual commands today. Does state name but does not answer other orientation questions  Cranial Nerves: II: Visual fields grossly normal, pupils equal, round, reactive to light and accommodation III,IV, VI: extra-ocular motions intact bilaterally V,VII: bilateral corneals VIII: hearing grossly intact IX,X: uvula rises symmetrically XI: mimics shrug.  Weaker on the right XII: opens mouth to command but does not protrude tongue   Motor: Right :Upper extremity 4+/5Left: Upper extremity 5/5 Lower extremity 4+/5Lower extremity 5/5  Sensory: withdraws to pain bilaterally  Cerebellar/Gait: not tested  ASSESSMENT/PLAN [name] is a 70 y.o. male with history of tobacco use and no regular medical care prior to admission presenting with receptive and expressive aphasia with right hemiparesis. He did not receive IV t-PA due to late presentation.  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  Resultant  Right sided weakness and aphasia  MRI - Multifocal moderate acute LEFT middle cerebral artery territory infarct without hemorrhagic conversion.  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  Carotid Doppler - refer to CTA  2D Echo - Mild LVH with LVEF 60-65%. Grade 1 diastolic dysfunction with normal filling pressure. Upper normal left atrial chamber size. Mildly calcified aortic annulus. Trivial mitral  regurgitation. No obvious PFO or ASD.  LDL - 136  HgbA1c 5.8  VTE prophylaxis - Lovenox DIET - DYS 1 Room service appropriate?: Yes; Fluid consistency:: Honey Thick  aspirin 81 mg daily prior to admission, now on aspirin 300 mg suppository daily  Ongoing aggressive stroke risk factor management  Therapy recommendations: Inpatient Rehabilitation recommended. Screening ordered. Disposition: Home Hypertension  Blood pressure tends to be elevated  Permissive hypertension (OK if < 220/120) but gradually normalize in 5-7 days  Hyperlipidemia  Home meds: 136 resumed in hospital  LDL 136, goal < 70  Now on Lipitor 80 mg daily.  Continue statin at discharge  Other Stroke Risk Factors  Advanced age  Cigarette smoker, advised to stop smoking  Obesity, Body mass index is 35.67 kg/(m^2).   Other Active Problems  No regular medical care prior to admission.  Hospital day # 3          Patient was seen and examined by me personally. Documentation reflects findings. The laboratory and radiographic studies reviewed by me.  ROS pertinent positives could not be fully documented due to aphasia    The patient remains at significant risk for recurrent stroke and TIAs given left carotid occlusion and high-grade right carotid stenosis. I counseled  the patient to quit smoking as well as to take his head seriously and maintain aggressive risk factor modification and have close medical follow-up   Recommend elective right carotid endarterectomy in the next 1-2 weeks. Discussed with Dr. early, patient, son and daughter over the phone.  Continue statin  Continue ASA and Plavix for 3 months  SIGNED BY: Dr. Antony Contras, MD    To contact Stroke Continuity provider, please refer to [email or web address] After hours, contact General Neurology

## 2015-05-24 NOTE — Progress Notes (Addendum)
Patient ID: Jonathan Bowen, male   DOB: Aug 15, 1945, 70 y.o.   MRN: [id number]   Subjective: Jonathan Bowen continues to have expressive and receptive aphasia and is non-conversant. His son tells me he slept well overnight and was not complaining of any pain. He has not had any further fevers. Son is hopeful he will go to CIR.  States he gets up and walks to the bathroom with assistance.  Objective: Vital signs in last 24 hours: Filed Vitals:   05/23/15 2119 05/24/15 0100 05/24/15 0203 05/24/15 0441  BP: 156/64 170/66 166/62 150/97  Pulse: 77 75 76 76  Temp: 98 F (36.7 C) 99 F (37.2 C) 98.3 F (36.8 C) 98.7 F (37.1 C)  TempSrc: Oral Oral Oral Oral  Resp: '20 18 18 20  '$ Height:      Weight:      SpO2: 97% 94% 91% 93%   Physical exam: General: overweight man with thick neck, unable to converse, fidgety in bed Pulm: normal work of breathing Abd: obese, non-tender Neuro: receptive and expressive aphasia, able to lift his right arm, able to lift his left arm to wave good-bye  Lab Results: Basic Metabolic Panel:  Recent Labs Lab 05/23/15 0615 05/24/15 0803  NA 143 141  K 4.1 4.5  CL 111 107  CO2 23 23  GLUCOSE 121* 112*  BUN 20 20  CREATININE 1.04 1.03  CALCIUM 8.9 9.0   Studies/Results:  CTA head and neck: IMPRESSION: 1. 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 Poorly reconstituted flow in the left MCA M1 segment, but more satisfactory distal left MCA collaterals. 2. 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 3. Fairly extensive soft plaque at the great vessel origins and in the neck in general. Adherent soft plaque or thrombus in the proximal left subclavian artery without significant stenosis. 4. No vertebral artery or posterior circulation stenosis. 5. No significant change in left basal ganglia and small cortically based infarcts in the left insula and operculum since 1013 hours today. No associated hemorrhage or  mass effect. Electronically Signed: By: Jonathan Bowen M.D. On: 05/21/2015 18:14   [name] Contrast  IMPRESSION: Multifocal moderate acute LEFT middle cerebral artery territory infarct without hemorrhagic conversion. Slow flow versus occluded LEFT carotid terminus and MCA corresponding to known angiographic abnormality. Electronically Signed   By: Jonathan Bowen M.D.   On: 05/22/2015 04:52   Medications: I have reviewed the patient's current medications. Scheduled Meds: . antiseptic oral rinse  7 mL Mouth Rinse q12n4p  . aspirin  300 mg Rectal Daily   Or  . aspirin  325 mg Oral Daily  . atorvastatin  80 mg Oral q1800  . cefTRIAXone (ROCEPHIN)  IV  1 g Intravenous Q24H  . chlorhexidine  15 mL Mouth Rinse BID  . enoxaparin (LOVENOX) injection  40 mg Subcutaneous Q24H  . 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 He also has significant bilateral carotid artery disease on CTA.  He may be a candidate for an endarterectomy. We'll ask vascular surgery to evaluate him today. Smoking, cholesterol, HTN are risk factors so we'll need to modify these -Thank for your help Neurology -Continue aspirin '300mg'$  suppository or '325mg'$  PO daily -Call vascular surgery today -Follow-up M7E  Complicated  Urinary Tract Infection: His temperature has been afebrile since spiking fever on Saturday.  Other labs are stable.  Patient unable to express whether he is having any symptoms and so we will continue treatment. I wonder if some of his encephalopathy is from infection. We'll follow cultures and continue ceftriaxone for now. -Continue ceftriaxone 1g IV daily -Urine culture with greater than 100,000 colonies GNR, susceptibilities pending  Tobacco abuse: He was smoking 2-3  packs per day. We've discussed the importance of smoking cessation to his family and will continue nicotine patch upon discharge. -Continue nicotine patch '21mg'$  daily  Hyperlipidemia: His LDL was 136. Started on high intensity statin -Continue atorvastatin '80mg'$  daily once he can swallow  Hypertension: Pressures 150s-170s overnight. We'll continue allowing permissive hypertension and normalize 5-7 days after acute event -Anticipate starting Lisinopril or Amlodipine for HTN  Dispo: Disposition is deferred at this time, awaiting improvement of current medical problems.  Anticipated discharge in approximately 1-2 day(s) pending CIR evaluation.   The patient does not have a current PCP (No primary care provider on file.) and does need an [facility] hospital follow-up appointment after discharge.  The patient does have transportation limitations that hinder transportation to clinic appointments.  .Services Needed at time of discharge: Y = Yes, Blank = No PT:   OT:   RN:   Equipment:   Other:     LOS: 3 days   Jonathan Ser, DO 05/24/2015, 10:02 AM

## 2015-05-24 NOTE — Evaluation (Signed)
Occupational Therapy Evaluation Patient Details Name: Jonathan Bowen MRN: [id number] DOB: 05/15/1945 Today's Date: 05/24/2015    History of Present Illness pt is a 70 y/o male with h/o tobacco abuse admitte to ED with AMS.  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   Clinical Impression   Pt seen for the above diagnosis and has the deficits listed below. Feel this pt would benefit from cont OT to address RUE weakness and incoordination, impulsivity, R limb apraxia, R inattn, cognitive and safety deficits that all decrease his independence with basic adls. Pt has family that lives close and son thinks they can assist pt 24/7 at home.  Pt needs 24/7 assist at this point due to impulsivity and should not be driving at this time.  Feel son does not see the extent of pt's deficits and is not seeing that this will be very difficult at home due to physical, cognitive and perceptual deficits.  That is way, it is felt this pt would benefit from inpt rehab at this time.  If pt cannot do inpt rehab, feel he could go home as long as family provides strict 24 hour S, hides the keys to the car and pt could get OP OT.  Will follow acutely.    Follow Up Recommendations  CIR;Other (comment) (If insurance does not approve, rec OPOT)    Equipment Recommendations  3 in 1 bedside comode    Recommendations for Other Services       Precautions / Restrictions Precautions Precautions: Fall Restrictions Weight Bearing Restrictions: No      Mobility Bed Mobility Overal bed mobility: Needs Assistance Bed Mobility: Supine to Sit     Supine to sit: Min guard     General bed mobility comments: Pt requires increased time and multiple attempts to complete supine to sit at edge of bed.  Pt required cues for hand placement to use rail vs. pulling on PTA.  HOB elevated to 20 degrees during bed mobility.    Transfers Overall transfer level: Needs  assistance Equipment used: Rolling walker (2 wheeled) Transfers: Sit to/from Stand Sit to Stand: Min assist         General transfer comment: Pt very impulsive when he gets up and does not take the time to steady self before walking.    Balance Overall balance assessment: Needs assistance Sitting-balance support: Feet supported Sitting balance-Leahy Scale: Fair     Standing balance support: Bilateral upper extremity supported;During functional activity Standing balance-Leahy Scale: Poor Standing balance comment: Pt with quick movements and turns losing balance x2 during evaluation in the room.                            ADL Overall ADL's : Needs assistance/impaired Eating/Feeding: Minimal assistance;Sitting Eating/Feeding Details (indicate cue type and reason): Needs assist to open packages and cut food. Grooming: Dance movement psychotherapist;Wash/dry hands;Oral care;Minimal assistance;Standing Grooming Details (indicate cue type and reason): Pt struggles with BUE activities (opening toothpaste) and gets frustrated quickly.  Pt with R limb apraxia and R inattention. Upper Body Bathing: Minimal assitance;Sitting   Lower Body Bathing: Moderate assistance;Sit to/from stand Lower Body Bathing Details (indicate cue type and reason): min to mod assist needed due to impulsivity and decreased balance. Pt sponge bathing which is not famiiliar to him.  Feel if he actually got in the shower he would do better. Upper Body Dressing : Minimal assistance;Sitting Upper  Body Dressing Details (indicate cue type and reason): Pt with difficulty pulling shirt all the way on arm on R side therefore making it hard to take over his heaad. Lower Body Dressing: Moderate assistance;Sit to/from stand;Cueing for compensatory techniques Lower Body Dressing Details (indicate cue type and reason): Pt struggles pulling socks on and using RUE to assist.  Pt could not tell that the sock was only over the Left 4 toes on  the L foot and just kept pulling to get it on but had not pulled it over his toe. Toilet Transfer: Minimal assistance;Ambulation;Comfort height toilet;RW Armed forces technical officer Details (indicate cue type and reason): impulsive when on his feet. Toileting- Clothing Manipulation and Hygiene: Minimal assistance;Sit to/from stand;Cueing for safety Toileting - Clothing Manipulation Details (indicate cue type and reason): quick to stand and quick to sit down plopping back onto toilet.     Functional mobility during ADLs: Minimal assistance;Rolling walker General ADL Comments: Pt can do appx 80% of his basic adls.  Unfortunately pt will be a large fall risk at home due to his impulsivity, R inattn, R limb apraxia and general lack of awareness.     Vision Vision Assessment?: Vision impaired- to be further tested in functional context Additional Comments: Pt is running into things on the R.  Hard to know with language deficits if it is vision or perception.   Perception Perception Perception Tested?: Yes Perception Deficits: Inattention/neglect Inattention/Neglect: Does not attend to right visual field;Does not attend to right side of body   Praxis Praxis Praxis tested?: Deficits Deficits: Limb apraxia    Pertinent Vitals/Pain Pain Assessment: No/denies pain     Hand Dominance Left   Extremity/Trunk Assessment Upper Extremity Assessment Upper Extremity Assessment: RUE deficits/detail RUE Deficits / Details: mildly weaker in RUE than LUE  4/5. RUE Coordination: decreased fine motor;decreased gross motor   Lower Extremity Assessment Lower Extremity Assessment: Defer to PT evaluation   Cervical / Trunk Assessment Cervical / Trunk Assessment: Normal   Communication Communication Communication: Receptive difficulties;Expressive difficulties   Cognition Arousal/Alertness: Awake/alert Behavior During Therapy: Impulsive Overall Cognitive Status: Impaired/Different from baseline Area of  Impairment: Attention;Following commands;Safety/judgement;Awareness;Problem solving   Current Attention Level: Focused Memory: Decreased short-term memory Following Commands: Follows one step commands inconsistently Safety/Judgement: Decreased awareness of deficits;Decreased awareness of safety Awareness: Intellectual Problem Solving: Slow processing;Requires tactile cues General Comments: Difficult to evaluate due to global aphasia   General Comments       Exercises       Shoulder Instructions      Home Living Family/patient expects to be discharged to:: Private residence Living Arrangements: Children Available Help at Discharge: Family;Available PRN/intermittently Type of Home: House Home Access: Stairs to enter [employer] of Steps: 5 Entrance Stairs-Rails: None Home Layout: One level     Bathroom Shower/Tub: Tub/shower unit Shower/tub characteristics: Architectural technologist: Standard     Home Equipment: None      Lives With: Son    Prior Functioning/Environment Level of Independence: Independent        Comments: pt drove, 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    OT Diagnosis: Generalized weakness;Cognitive deficits;Disturbance of vision;Hemiplegia non-dominant side;Apraxia   OT Problem List: Decreased strength;Impaired balance (sitting and/or standing);Impaired vision/perception;Decreased coordination;Decreased cognition;Decreased safety awareness;Decreased knowledge of use of DME or AE;Decreased knowledge of precautions;Impaired sensation;Impaired UE functional use   OT Treatment/Interventions: Self-care/ADL training;Therapeutic activities;DME and/or AE instruction;Neuromuscular education;Visual/perceptual remediation/compensation    OT Goals(Current goals can be found in the care plan  section) Acute Rehab OT Goals Patient Stated Goal: unable to state OT Goal Formulation: With patient/family Time For Goal Achievement:  06/07/15 Potential to Achieve Goals: Good ADL Goals Pt Will Perform Eating: Independently;sitting Pt Will Perform Grooming: with modified independence;standing Pt Will Perform Upper Body Dressing: with supervision;sitting Pt Will Perform Lower Body Dressing: with supervision;sit to/from stand Pt Will Perform Tub/Shower Transfer: Shower transfer;with supervision;rolling walker;ambulating Additional ADL Goal #1: Pt will walk to bathroom with walker and toilet on 3:1 over commode with S.  OT Frequency: Min 3X/week   Barriers to D/C:    Son says they can provide 24/7 assist.       Co-evaluation              End of Session Equipment Utilized During Treatment: Rolling walker Nurse Communication: Mobility status  Activity Tolerance: Patient tolerated treatment well Patient left: in chair;with call bell/phone within reach;with chair alarm set;with family/visitor present   Time: 1215-1249 OT Time Calculation (min): 34 min Charges:  OT General Charges $OT Visit: 1 Procedure OT Evaluation $OT Eval Moderate Complexity: 1 Procedure OT Treatments $Self Care/Home Management : 8-22 mins G-Codes:    Glenford Peers 18-Jun-2015, 1:05 PM  [phone]

## 2015-05-24 NOTE — Discharge Instructions (Signed)
Jonathan Bowen,  We are discharging you on multiple new medications now that you have had a stroke:  1.  Atorvastatin '80mg'$  daily to help with high cholesterol. 2.  Amlodipine '5mg'$  daily to help with high blood pressure.  We may need to increase this or add other medications in the future. 3.  Aspirin '81mg'$  daily and Plavix '75mg'$  daily.  This is to help prevent further strokes. 4.  Nicotine patches to help with quitting smoking.  Please discuss with primary care doctor at follow up how to taper this medication. 5.  Bactrim DS twice per day for 5 more days to treat your urinary tract infection.  You will complete this on February 11.  For now, continue speech therapy recommendations of dysphagia 1 diet with honey thick liquids.  Continue crushing his medications and administering in puree.   We have also arranged for numerous home health services to assist in your care such as physical, occupational, and speech therapy.  We have also ordered home health nursing and social work.  Follow up with Dr. Donnetta Hutching (vascular surgery) and primary care as mentioned above.  You will also be contacted by neurology for follow up in about 4 weeks.  Call their office if you have not been contacted within a couple weeks.  You also need to establish a primary care doctor.  For now, you have been set up to follow with [facility] at the address listed above where you can follow up with your primary care needs.  Take Care.

## 2015-05-25 ENCOUNTER — Telehealth: Payer: Self-pay | Admitting: Internal Medicine

## 2015-05-25 ENCOUNTER — Other Ambulatory Visit: Payer: Self-pay | Admitting: Internal Medicine

## 2015-05-25 ENCOUNTER — Inpatient Hospital Stay: Payer: Medicare Other

## 2015-05-25 DIAGNOSIS — N390 Urinary tract infection, site not specified: Secondary | ICD-10-CM

## 2015-05-25 LAB — URINE CULTURE
Culture: >=100000
SPECIAL REQUESTS: NORMAL

## 2015-05-25 MED ORDER — CEPHALEXIN 500 MG PO CAPS: 500.0000 mg | ORAL_CAPSULE | Freq: Two times a day (BID) | ORAL | Status: AC

## 2015-05-25 NOTE — Telephone Encounter (Signed)
Patients urine culture susceptibilities were resistant to TMP-SMX that patient was discharged on.  I called patients son and explained results to him.  Asked him to stop TMP-SMX and start Cephalexin for 4 more days.  Plan: - Cephalexin '500mg'$  BID x 4 more days

## 2015-05-26 DIAGNOSIS — E785 Hyperlipidemia, unspecified: Secondary | ICD-10-CM | POA: Diagnosis not present

## 2015-05-26 DIAGNOSIS — I69351 Hemiplegia and hemiparesis following cerebral infarction affecting right dominant side: Secondary | ICD-10-CM | POA: Diagnosis not present

## 2015-05-26 DIAGNOSIS — I69320 Aphasia following cerebral infarction: Secondary | ICD-10-CM | POA: Diagnosis not present

## 2015-05-26 DIAGNOSIS — R1310 Dysphagia, unspecified: Secondary | ICD-10-CM | POA: Diagnosis not present

## 2015-05-26 DIAGNOSIS — I69392 Facial weakness following cerebral infarction: Secondary | ICD-10-CM | POA: Diagnosis not present

## 2015-05-26 DIAGNOSIS — Z7902 Long term (current) use of antithrombotics/antiplatelets: Secondary | ICD-10-CM | POA: Diagnosis not present

## 2015-05-26 DIAGNOSIS — F17210 Nicotine dependence, cigarettes, uncomplicated: Secondary | ICD-10-CM | POA: Diagnosis not present

## 2015-05-26 DIAGNOSIS — Z9181 History of falling: Secondary | ICD-10-CM | POA: Diagnosis not present

## 2015-05-26 DIAGNOSIS — I10 Essential (primary) hypertension: Secondary | ICD-10-CM | POA: Diagnosis not present

## 2015-05-26 DIAGNOSIS — Z7982 Long term (current) use of aspirin: Secondary | ICD-10-CM | POA: Diagnosis not present

## 2015-05-26 DIAGNOSIS — N390 Urinary tract infection, site not specified: Secondary | ICD-10-CM | POA: Diagnosis not present

## 2015-05-26 DIAGNOSIS — I69391 Dysphagia following cerebral infarction: Secondary | ICD-10-CM | POA: Diagnosis not present

## 2015-05-26 DIAGNOSIS — Z792 Long term (current) use of antibiotics: Secondary | ICD-10-CM | POA: Diagnosis not present

## 2015-05-27 DIAGNOSIS — I69351 Hemiplegia and hemiparesis following cerebral infarction affecting right dominant side: Secondary | ICD-10-CM | POA: Diagnosis not present

## 2015-05-27 DIAGNOSIS — I69320 Aphasia following cerebral infarction: Secondary | ICD-10-CM | POA: Diagnosis not present

## 2015-05-27 DIAGNOSIS — R1310 Dysphagia, unspecified: Secondary | ICD-10-CM | POA: Diagnosis not present

## 2015-05-27 DIAGNOSIS — I69392 Facial weakness following cerebral infarction: Secondary | ICD-10-CM | POA: Diagnosis not present

## 2015-05-27 DIAGNOSIS — N390 Urinary tract infection, site not specified: Secondary | ICD-10-CM | POA: Diagnosis not present

## 2015-05-27 DIAGNOSIS — I69391 Dysphagia following cerebral infarction: Secondary | ICD-10-CM | POA: Diagnosis not present

## 2015-05-28 DIAGNOSIS — I69392 Facial weakness following cerebral infarction: Secondary | ICD-10-CM | POA: Diagnosis not present

## 2015-05-28 DIAGNOSIS — I69320 Aphasia following cerebral infarction: Secondary | ICD-10-CM | POA: Diagnosis not present

## 2015-05-28 DIAGNOSIS — I69391 Dysphagia following cerebral infarction: Secondary | ICD-10-CM | POA: Diagnosis not present

## 2015-05-28 DIAGNOSIS — R1310 Dysphagia, unspecified: Secondary | ICD-10-CM | POA: Diagnosis not present

## 2015-05-28 DIAGNOSIS — N390 Urinary tract infection, site not specified: Secondary | ICD-10-CM | POA: Diagnosis not present

## 2015-05-28 DIAGNOSIS — I69351 Hemiplegia and hemiparesis following cerebral infarction affecting right dominant side: Secondary | ICD-10-CM | POA: Diagnosis not present

## 2015-05-31 ENCOUNTER — Inpatient Hospital Stay: Payer: Medicare Other | Admitting: Family Medicine

## 2015-06-01 ENCOUNTER — Encounter: Payer: Self-pay | Admitting: Vascular Surgery

## 2015-06-03 DIAGNOSIS — I69391 Dysphagia following cerebral infarction: Secondary | ICD-10-CM | POA: Diagnosis not present

## 2015-06-03 DIAGNOSIS — N390 Urinary tract infection, site not specified: Secondary | ICD-10-CM | POA: Diagnosis not present

## 2015-06-03 DIAGNOSIS — I69351 Hemiplegia and hemiparesis following cerebral infarction affecting right dominant side: Secondary | ICD-10-CM | POA: Diagnosis not present

## 2015-06-03 DIAGNOSIS — I69320 Aphasia following cerebral infarction: Secondary | ICD-10-CM | POA: Diagnosis not present

## 2015-06-03 DIAGNOSIS — I69392 Facial weakness following cerebral infarction: Secondary | ICD-10-CM | POA: Diagnosis not present

## 2015-06-03 DIAGNOSIS — R1310 Dysphagia, unspecified: Secondary | ICD-10-CM | POA: Diagnosis not present

## 2015-06-08 ENCOUNTER — Ambulatory Visit (INDEPENDENT_AMBULATORY_CARE_PROVIDER_SITE_OTHER): Payer: Medicare Other | Admitting: Vascular Surgery

## 2015-06-08 ENCOUNTER — Encounter: Payer: Self-pay | Admitting: Vascular Surgery

## 2015-06-08 ENCOUNTER — Other Ambulatory Visit: Payer: Self-pay

## 2015-06-08 VITALS — BP 147/72 | HR 66 | Temp 99.0°F | Resp 18 | Ht 67.5 in | Wt 226.0 lb

## 2015-06-08 DIAGNOSIS — I639 Cerebral infarction, unspecified: Secondary | ICD-10-CM

## 2015-06-08 DIAGNOSIS — I6523 Occlusion and stenosis of bilateral carotid arteries: Secondary | ICD-10-CM

## 2015-06-08 NOTE — Progress Notes (Signed)
[facility] [place]  Patient name: Jonathan Bowen MRN: [id number] DOB: 07/08/1945 Sex: male  REASON FOR VISIT: Follow-up of recent hospitalization for left brain stroke.  HPI: Jonathan Bowen is a 70 y.o. male patient had presented with left brain stroke. 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 He did well in the hospital and was returned to baseline neurologic function. He is here today with his daughter states that he may have some mild confusion but is essentially back to baseline.  Past Medical History  Diagnosis Date  . Smoker   . Stroke ([place])   . Hyperlipidemia   . Hypertension     Family History  Problem Relation Age of Onset  . Diabetes Mother   . Diabetes Father   . Hypertension Mother   . Hypertension Father   . Hyperlipidemia Father   . Hyperlipidemia Mother     SOCIAL HISTORY: Social History  Substance Use Topics  . Smoking status: Former Smoker -- 3.00 packs/day    Quit date: 05/28/2015  . Smokeless tobacco: Not on file  . Alcohol Use: No    No Known Allergies  Current Outpatient Prescriptions  Medication Sig Dispense Refill  . amLODipine (NORVASC) 5 MG tablet Take 1 tablet (5 mg total) by mouth daily. 30 tablet 0  . aspirin EC 81 MG tablet Take 81 mg by mouth daily.    Marland Kitchen atorvastatin (LIPITOR) 80 MG tablet Take 1 tablet (80 mg total) by mouth daily at 6 PM. 30 tablet 0  . clopidogrel (PLAVIX) 75 MG tablet Take 1 tablet (75 mg total) by mouth daily. 30 tablet 2  . nicotine (NICODERM CQ - DOSED IN MG/24 HOURS) 21 mg/24hr patch Place 1 patch (21 mg total) onto the skin daily. 28 patch 0   No current facility-administered medications for this visit.    REVIEW OF SYSTEMS:  '[X]'$  denotes positive finding, '[ ]'$  denotes negative finding Cardiac  Comments:  Chest pain or chest pressure:    Shortness of breath upon exertion:     Short of breath when lying flat:    Irregular heart rhythm:        Vascular    Pain in calf, thigh, or hip brought on by ambulation:    Pain in feet at night that wakes you up from your sleep:     Blood clot in your veins: x   Leg swelling:         Pulmonary    Oxygen at home:    Productive cough:     Wheezing:         Neurologic    Sudden weakness in arms or legs:  x   Sudden numbness in arms or legs:     Sudden onset of difficulty speaking or slurred speech: x   Temporary loss of vision in one eye:     Problems with dizziness:         Gastrointestinal    Blood in stool:     Vomited blood:         Genitourinary    Burning when urinating:     Blood in urine:        Psychiatric    Major depression:         Hematologic    Bleeding problems:    Problems with blood clotting too easily:        Skin  Rashes or ulcers:        Constitutional    Fever or chills:      PHYSICAL EXAM: Filed Vitals:   06/08/15 1503  BP: 147/72  Pulse: 66  Temp: 99 F (37.2 C)  TempSrc: Oral  Resp: 18  Height: 5' 7.5" (1.715 m)  Weight: 226 lb (102.513 kg)  SpO2: 98%    GENERAL: The patient is a well-nourished male, in no acute distress. The vital signs are documented above. CARDIAC: There is a regular rate and rhythm.  VASCULAR:  Plus radial pulses bilaterally, no carotid bruits noted PULMONARY: There is good air exchange bilaterally without wheezing or rales.  MUSCULOSKELETAL: There are no major deformities or cyanosis. NEUROLOGIC: No focal weakness or paresthesias are detected. SKIN: There are no ulcers or rashes noted. PSYCHIATRIC: The patient has a normal affect.  DATA:   reviewed his CT scan from his recent admission. Does show a high-grade carotid stenoses bilaterally at the bifurcation and intracranial left occlusion.  MEDICAL ISSUES:  discussed the recommendation for right carotid endarterectomy. Discussed 12% risk of stroke with surgery. He understands and wished  to proceed as soon as possible. He will continue his Plavix through the procedure with his recent neurologic event.  No Follow-up on file.   Curt Jews [facility] [place]: [phone]

## 2015-06-10 DIAGNOSIS — N390 Urinary tract infection, site not specified: Secondary | ICD-10-CM | POA: Diagnosis not present

## 2015-06-10 DIAGNOSIS — I69320 Aphasia following cerebral infarction: Secondary | ICD-10-CM | POA: Diagnosis not present

## 2015-06-10 DIAGNOSIS — I69391 Dysphagia following cerebral infarction: Secondary | ICD-10-CM | POA: Diagnosis not present

## 2015-06-10 DIAGNOSIS — R1310 Dysphagia, unspecified: Secondary | ICD-10-CM | POA: Diagnosis not present

## 2015-06-10 DIAGNOSIS — I69392 Facial weakness following cerebral infarction: Secondary | ICD-10-CM | POA: Diagnosis not present

## 2015-06-10 DIAGNOSIS — I69351 Hemiplegia and hemiparesis following cerebral infarction affecting right dominant side: Secondary | ICD-10-CM | POA: Diagnosis not present

## 2015-06-14 NOTE — Pre-Procedure Instructions (Signed)
Jonathan Bowen  06/14/2015      [id number] [id number]: [phone]: [phone]   Your procedure is scheduled on Friday, June 18, 2015   [facility]   Call this number if you have problems the morning of surgery:  [phone]   Remember:  Do not eat food or drink liquids after midnight Thursday, June 17, 2015   Take these medicines the morning of surgery with A SIP OF WATER : amLODipine (NORVASC), aspirin, clopidogrel (PLAVIX), nicotine (NICODERM CQ - DOSED IN MG/24 HOURS) 21 mg/24hr patch Stop taking vitamins, fish oil, and herbal medications. Do not take any NSAIDs ie: Ibuprofen, Advil, Naproxen, BC and Goody Powder; stop now  Do not wear jewelry - no rings or watches.  Do not wear lotions or colognes.  You may not wear deodorant the day of surgery.              Men may shave face and neck.  Do not bring valuables to the hospital.  [facility] any belongings or [facility]ables.  Contacts, dentures or bridgework may not be worn into surgery.  Leave your suitcase in the car.  After surgery it may be brought to your room.  For patients admitted to the hospital, discharge time will be determined by your treatment team.  Name and phone number of your driver:    Special instructions:  [facility]- Preparing for Surgery  Before surgery, you can play an important role.  Because skin is not sterile, your skin needs to be as free of germs as possible.  You can reduce the number of germs on you skin by washing with CHG (chlorahexidine gluconate) soap before surgery.  CHG is an antiseptic cleaner which kills germs and bonds with the skin to continue killing germs even after washing.  Please DO NOT use if you have an allergy to CHG or antibacterial soaps.  If your skin becomes reddened/irritated stop using the CHG and inform your nurse when  you arrive at Short Stay.  Do not shave (including legs and underarms) for at least 48 hours prior to the first CHG shower.  You may shave your face.  Please follow these instructions carefully:   1.  Shower with CHG Soap the night before surgery and the morning of Surgery.  2.  If you choose to wash your hair, wash your hair first as usual with your normal shampoo.  3.  After you shampoo, rinse your hair and body thoroughly to remove the Shampoo.  4.  Use CHG as you would any other liquid soap.  You can apply chg directly  to the skin and wash gently with scrungie or a clean washcloth.  5.  Apply the CHG Soap to your body ONLY FROM THE NECK DOWN.  Do not use on open wounds or open sores.  Avoid contact with your eyes, ears, mouth and genitals (private parts).  Wash genitals (private parts) with your normal soap.  6.  Wash thoroughly, paying special attention to the area where your surgery will be performed.  7.  Thoroughly rinse your body with warm water from the neck down.  8.  DO NOT shower/wash with your normal soap after using and rinsing off the CHG Soap.  9.  Pat yourself dry with a clean towel.  10.  Wear clean pajamas.            11.  Place clean sheets on your bed the night of your first shower and do not sleep with pets.  Day of Surgery  Do not apply any lotions/deodorants the morning of surgery.  Please wear clean clothes to the hospital/surgery center.  Please read over the following fact sheets that you were given. Pain Booklet, Coughing and Deep Breathing, Blood Transfusion Information, MRSA Information and Surgical Site Infection Prevention

## 2015-06-15 ENCOUNTER — Encounter (HOSPITAL_COMMUNITY)
Admission: RE | Admit: 2015-06-15 | Discharge: 2015-06-15 | Disposition: A | Discharge status: Home / Self Care | Payer: Medicare Other | Source: Ambulatory Visit | Attending: Vascular Surgery | Admitting: Vascular Surgery

## 2015-06-15 ENCOUNTER — Ambulatory Visit: Payer: Medicare Other | Admitting: Internal Medicine

## 2015-06-15 ENCOUNTER — Encounter (HOSPITAL_COMMUNITY): Payer: Self-pay

## 2015-06-15 HISTORY — DX: Adverse effect of unspecified anesthetic, initial encounter: T41.45XA

## 2015-06-15 HISTORY — DX: Other complications of anesthesia, initial encounter: T88.59XA

## 2015-06-15 LAB — URINE MICROSCOPIC-ADD ON

## 2015-06-15 LAB — COMPREHENSIVE METABOLIC PANEL
ALT: 18 U/L (ref 17–63)
AST: 16 U/L (ref 15–41)
Albumin: 3.7 g/dL (ref 3.5–5.0)
Alkaline Phosphatase: 67 U/L (ref 38–126)
Anion gap: 9 (ref 5–15)
BILIRUBIN TOTAL: 0.8 mg/dL (ref 0.3–1.2)
BUN: 12 mg/dL (ref 6–20)
CO2: 23 mmol/L (ref 22–32)
CREATININE: 0.94 mg/dL (ref 0.61–1.24)
Calcium: 9.1 mg/dL (ref 8.9–10.3)
Chloride: 109 mmol/L (ref 101–111)
Glucose, Bld: 114 mg/dL — ABNORMAL HIGH (ref 65–99)
POTASSIUM: 4.4 mmol/L (ref 3.5–5.1)
SODIUM: 141 mmol/L (ref 135–145)
TOTAL PROTEIN: 6.9 g/dL (ref 6.5–8.1)

## 2015-06-15 LAB — CBC
HCT: 44.7 % (ref 39.0–52.0)
Hemoglobin: 14.8 g/dL (ref 13.0–17.0)
MCH: 29.9 pg (ref 26.0–34.0)
MCHC: 33.1 g/dL (ref 30.0–36.0)
MCV: 90.3 fL (ref 78.0–100.0)
PLATELETS: 213 10*3/uL (ref 150–400)
RBC: 4.95 MIL/uL (ref 4.22–5.81)
RDW: 13.4 % (ref 11.5–15.5)
WBC: 9.8 10*3/uL (ref 4.0–10.5)

## 2015-06-15 LAB — URINALYSIS, ROUTINE W REFLEX MICROSCOPIC
Bilirubin Urine: NEGATIVE
GLUCOSE, UA: NEGATIVE mg/dL
Ketones, ur: NEGATIVE mg/dL
LEUKOCYTES UA: NEGATIVE
Nitrite: NEGATIVE
PROTEIN: NEGATIVE mg/dL
SPECIFIC GRAVITY, URINE: 1.017 (ref 1.005–1.030)
pH: 5.5 (ref 5.0–8.0)

## 2015-06-15 LAB — TYPE AND SCREEN
ABO/RH(D): A POS
Antibody Screen: NEGATIVE

## 2015-06-15 LAB — PROTIME-INR
INR: 1.01 (ref 0.00–1.49)
PROTHROMBIN TIME: 13.5 s (ref 11.6–15.2)

## 2015-06-15 LAB — SURGICAL PCR SCREEN
MRSA, PCR: NEGATIVE
Staphylococcus aureus: NEGATIVE

## 2015-06-15 LAB — ABO/RH: ABO/RH(D): A POS

## 2015-06-15 LAB — APTT: APTT: 29 s (ref 24–37)

## 2015-06-15 NOTE — Progress Notes (Addendum)
Patient very nervous.  Has never been to a doctor, nor has he had any surgery. Has been instructed by Dr. Donnetta Hutching to still take Plavix & Aspirin.

## 2015-06-15 NOTE — Progress Notes (Signed)
   06/15/15 3710  OBSTRUCTIVE SLEEP APNEA  Have you ever been diagnosed with sleep apnea through a sleep study? No  Do you snore loudly (loud enough to be heard through closed doors)?  1  Do you often feel tired, fatigued, or sleepy during the daytime (such as falling asleep during driving or talking to someone)? 0  Has anyone observed you stop breathing during your sleep? 0  Do you have, or are you being treated for high blood pressure? 1 (just learned he had HTN)  BMI more than 35 kg/m2? 1  Age > 50 (1-yes) 1  Neck circumference greater than:Male 16 inches or larger, Male 17inches or larger? 0  Male Gender (Yes=1) 1  Obstructive Sleep Apnea Score 5  Score 5 or greater  Results sent to PCP

## 2015-06-17 MED ORDER — SODIUM CHLORIDE 0.9 % IV SOLN: INTRAVENOUS | Status: DC

## 2015-06-17 MED ORDER — DEXTROSE 5 % IV SOLN
1.5000 g | INTRAVENOUS | Status: AC
  Administered 2015-06-18: 1.5 g via INTRAVENOUS
  Filled 2015-06-17: qty 1.5

## 2015-06-18 ENCOUNTER — Inpatient Hospital Stay (HOSPITAL_COMMUNITY): Payer: Medicare Other | Admitting: Anesthesiology

## 2015-06-18 ENCOUNTER — Inpatient Hospital Stay (HOSPITAL_COMMUNITY)
Admission: RE | Admit: 2015-06-18 | Discharge: 2015-06-19 | DRG: 039 | Disposition: A | Discharge status: Home / Self Care | Payer: Medicare Other | Source: Ambulatory Visit | Attending: Vascular Surgery | Admitting: Vascular Surgery

## 2015-06-18 ENCOUNTER — Encounter (HOSPITAL_COMMUNITY): Payer: Self-pay

## 2015-06-18 ENCOUNTER — Encounter (HOSPITAL_COMMUNITY)
Admission: RE | Disposition: A | Discharge status: Home / Self Care | Payer: Self-pay | Source: Ambulatory Visit | Attending: Vascular Surgery

## 2015-06-18 DIAGNOSIS — I10 Essential (primary) hypertension: Secondary | ICD-10-CM | POA: Diagnosis present

## 2015-06-18 DIAGNOSIS — Z87891 Personal history of nicotine dependence: Secondary | ICD-10-CM

## 2015-06-18 DIAGNOSIS — I6529 Occlusion and stenosis of unspecified carotid artery: Secondary | ICD-10-CM | POA: Diagnosis present

## 2015-06-18 DIAGNOSIS — Z8249 Family history of ischemic heart disease and other diseases of the circulatory system: Secondary | ICD-10-CM | POA: Diagnosis not present

## 2015-06-18 DIAGNOSIS — E785 Hyperlipidemia, unspecified: Secondary | ICD-10-CM | POA: Diagnosis present

## 2015-06-18 DIAGNOSIS — Z79899 Other long term (current) drug therapy: Secondary | ICD-10-CM

## 2015-06-18 DIAGNOSIS — Z8673 Personal history of transient ischemic attack (TIA), and cerebral infarction without residual deficits: Secondary | ICD-10-CM | POA: Diagnosis not present

## 2015-06-18 DIAGNOSIS — Z7982 Long term (current) use of aspirin: Secondary | ICD-10-CM

## 2015-06-18 DIAGNOSIS — I6521 Occlusion and stenosis of right carotid artery: Secondary | ICD-10-CM | POA: Diagnosis not present

## 2015-06-18 DIAGNOSIS — Z7902 Long term (current) use of antithrombotics/antiplatelets: Secondary | ICD-10-CM | POA: Diagnosis not present

## 2015-06-18 HISTORY — PX: ENDARTERECTOMY: SHX5162

## 2015-06-18 HISTORY — PX: PATCH ANGIOPLASTY: SHX6230

## 2015-06-18 SURGERY — ENDARTERECTOMY, CAROTID
Anesthesia: General | Site: Neck | Laterality: Right

## 2015-06-18 MED ORDER — LIDOCAINE HCL (CARDIAC) 20 MG/ML IV SOLN
INTRAVENOUS | Status: AC
  Filled 2015-06-18: qty 5

## 2015-06-18 MED ORDER — 0.9 % SODIUM CHLORIDE (POUR BTL) OPTIME
TOPICAL | Status: DC | PRN
Start: 2015-06-18 — End: 2015-06-18
  Administered 2015-06-18: 2000 mL

## 2015-06-18 MED ORDER — OXYCODONE HCL 5 MG PO TABS: 5.0000 mg | ORAL_TABLET | Freq: Four times a day (QID) | ORAL | Status: DC | PRN

## 2015-06-18 MED ORDER — ONDANSETRON HCL 4 MG/2ML IJ SOLN: 4.0000 mg | Freq: Four times a day (QID) | INTRAMUSCULAR | Status: DC | PRN

## 2015-06-18 MED ORDER — ATORVASTATIN CALCIUM 80 MG PO TABS: 80.0000 mg | ORAL_TABLET | Freq: Every day | ORAL | Status: DC

## 2015-06-18 MED ORDER — LACTATED RINGERS IV SOLN
INTRAVENOUS | Status: DC | PRN
  Administered 2015-06-18: 07:00:00 via INTRAVENOUS

## 2015-06-18 MED ORDER — NICOTINE 21 MG/24HR TD PT24
21.0000 mg | MEDICATED_PATCH | Freq: Every day | TRANSDERMAL | Status: DC
  Administered 2015-06-19: 21 mg via TRANSDERMAL
  Filled 2015-06-18: qty 1

## 2015-06-18 MED ORDER — AMLODIPINE BESYLATE 5 MG PO TABS
5.0000 mg | ORAL_TABLET | Freq: Every day | ORAL | Status: DC
  Administered 2015-06-19: 5 mg via ORAL
  Filled 2015-06-18: qty 1

## 2015-06-18 MED ORDER — PROMETHAZINE HCL 25 MG/ML IJ SOLN: 6.2500 mg | INTRAMUSCULAR | Status: DC | PRN

## 2015-06-18 MED ORDER — OXYCODONE HCL 5 MG PO TABS
ORAL_TABLET | ORAL | Status: AC
  Filled 2015-06-18: qty 1

## 2015-06-18 MED ORDER — LABETALOL HCL 5 MG/ML IV SOLN
10.0000 mg | INTRAVENOUS | Status: DC | PRN
  Administered 2015-06-19: 10 mg via INTRAVENOUS
  Filled 2015-06-18: qty 4

## 2015-06-18 MED ORDER — BISACODYL 10 MG RE SUPP: 10.0000 mg | Freq: Every day | RECTAL | Status: DC | PRN

## 2015-06-18 MED ORDER — SODIUM CHLORIDE 0.9 % IV SOLN
INTRAVENOUS | Status: DC
  Administered 2015-06-18 (×2): via INTRAVENOUS

## 2015-06-18 MED ORDER — PHENYLEPHRINE 40 MCG/ML (10ML) SYRINGE FOR IV PUSH (FOR BLOOD PRESSURE SUPPORT)
PREFILLED_SYRINGE | INTRAVENOUS | Status: AC
  Filled 2015-06-18: qty 10

## 2015-06-18 MED ORDER — SUGAMMADEX SODIUM 200 MG/2ML IV SOLN
INTRAVENOUS | Status: DC | PRN
  Administered 2015-06-18: 200 mg via INTRAVENOUS

## 2015-06-18 MED ORDER — SODIUM CHLORIDE 0.9 % IV SOLN: 500.0000 mL | Freq: Once | INTRAVENOUS | Status: DC | PRN

## 2015-06-18 MED ORDER — CHLORHEXIDINE GLUCONATE CLOTH 2 % EX PADS: 6.0000 | MEDICATED_PAD | Freq: Once | CUTANEOUS | Status: DC

## 2015-06-18 MED ORDER — ESMOLOL HCL 100 MG/10ML IV SOLN
INTRAVENOUS | Status: DC | PRN
  Administered 2015-06-18 (×2): 10 ug via INTRAVENOUS

## 2015-06-18 MED ORDER — SODIUM CHLORIDE 0.9 % IV SOLN
INTRAVENOUS | Status: DC | PRN
  Administered 2015-06-18: 08:00:00

## 2015-06-18 MED ORDER — LIDOCAINE HCL (PF) 1 % IJ SOLN
INTRAMUSCULAR | Status: AC
  Filled 2015-06-18: qty 30

## 2015-06-18 MED ORDER — ASPIRIN EC 81 MG PO TBEC
81.0000 mg | DELAYED_RELEASE_TABLET | Freq: Every day | ORAL | Status: DC
  Administered 2015-06-19: 81 mg via ORAL
  Filled 2015-06-18: qty 1

## 2015-06-18 MED ORDER — METOPROLOL TARTRATE 1 MG/ML IV SOLN: 2.0000 mg | INTRAVENOUS | Status: DC | PRN

## 2015-06-18 MED ORDER — OXYCODONE HCL 5 MG/5ML PO SOLN: 5.0000 mg | Freq: Once | ORAL | Status: AC | PRN

## 2015-06-18 MED ORDER — FENTANYL CITRATE (PF) 250 MCG/5ML IJ SOLN
INTRAMUSCULAR | Status: AC
  Filled 2015-06-18: qty 5

## 2015-06-18 MED ORDER — MAGNESIUM HYDROXIDE 400 MG/5ML PO SUSP: 30.0000 mL | Freq: Every day | ORAL | Status: DC | PRN

## 2015-06-18 MED ORDER — ACETAMINOPHEN 325 MG RE SUPP
325.0000 mg | RECTAL | Status: DC | PRN
  Filled 2015-06-18: qty 2

## 2015-06-18 MED ORDER — HYDRALAZINE HCL 20 MG/ML IJ SOLN: 5.0000 mg | INTRAMUSCULAR | Status: DC | PRN

## 2015-06-18 MED ORDER — PROPOFOL 10 MG/ML IV BOLUS
INTRAVENOUS | Status: DC | PRN
  Administered 2015-06-18: 100 mg via INTRAVENOUS
  Administered 2015-06-18: 10 mg via INTRAVENOUS
  Administered 2015-06-18: 20 mg via INTRAVENOUS
  Administered 2015-06-18: 10 mg via INTRAVENOUS
  Administered 2015-06-18: 20 mg via INTRAVENOUS

## 2015-06-18 MED ORDER — POTASSIUM CHLORIDE CRYS ER 20 MEQ PO TBCR: 20.0000 meq | EXTENDED_RELEASE_TABLET | Freq: Every day | ORAL | Status: DC | PRN

## 2015-06-18 MED ORDER — SUGAMMADEX SODIUM 200 MG/2ML IV SOLN
INTRAVENOUS | Status: AC
  Filled 2015-06-18: qty 2

## 2015-06-18 MED ORDER — ROCURONIUM BROMIDE 100 MG/10ML IV SOLN
INTRAVENOUS | Status: DC | PRN
  Administered 2015-06-18: 30 mg via INTRAVENOUS
  Administered 2015-06-18 (×2): 10 mg via INTRAVENOUS

## 2015-06-18 MED ORDER — DOCUSATE SODIUM 100 MG PO CAPS: 100.0000 mg | ORAL_CAPSULE | Freq: Every day | ORAL | Status: DC

## 2015-06-18 MED ORDER — OXYCODONE HCL 5 MG PO TABS
5.0000 mg | ORAL_TABLET | Freq: Once | ORAL | Status: AC | PRN
  Administered 2015-06-18: 5 mg via ORAL

## 2015-06-18 MED ORDER — SUCCINYLCHOLINE CHLORIDE 20 MG/ML IJ SOLN
INTRAMUSCULAR | Status: AC
  Filled 2015-06-18: qty 1

## 2015-06-18 MED ORDER — ACETAMINOPHEN 325 MG PO TABS: 325.0000 mg | ORAL_TABLET | ORAL | Status: DC | PRN

## 2015-06-18 MED ORDER — PROPOFOL 10 MG/ML IV BOLUS
INTRAVENOUS | Status: AC
  Filled 2015-06-18: qty 20

## 2015-06-18 MED ORDER — PANTOPRAZOLE SODIUM 40 MG PO TBEC
40.0000 mg | DELAYED_RELEASE_TABLET | Freq: Every day | ORAL | Status: DC
  Administered 2015-06-19: 40 mg via ORAL
  Filled 2015-06-18: qty 1

## 2015-06-18 MED ORDER — PHENYLEPHRINE HCL 10 MG/ML IJ SOLN
10.0000 mg | INTRAMUSCULAR | Status: DC | PRN
  Administered 2015-06-18: 25 ug/min via INTRAVENOUS

## 2015-06-18 MED ORDER — CLOPIDOGREL BISULFATE 75 MG PO TABS
75.0000 mg | ORAL_TABLET | Freq: Every day | ORAL | Status: DC
  Administered 2015-06-19: 75 mg via ORAL
  Filled 2015-06-18: qty 1

## 2015-06-18 MED ORDER — EPHEDRINE SULFATE 50 MG/ML IJ SOLN
INTRAMUSCULAR | Status: DC | PRN
  Administered 2015-06-18: 2.5 mg via INTRAVENOUS
  Administered 2015-06-18 (×4): 5 mg via INTRAVENOUS
  Administered 2015-06-18: 2.5 mg via INTRAVENOUS

## 2015-06-18 MED ORDER — GUAIFENESIN-DM 100-10 MG/5ML PO SYRP: 15.0000 mL | ORAL_SOLUTION | ORAL | Status: DC | PRN

## 2015-06-18 MED ORDER — MORPHINE SULFATE (PF) 2 MG/ML IV SOLN: 2.0000 mg | INTRAVENOUS | Status: DC | PRN

## 2015-06-18 MED ORDER — ONDANSETRON HCL 4 MG/2ML IJ SOLN
INTRAMUSCULAR | Status: AC
  Filled 2015-06-18: qty 2

## 2015-06-18 MED ORDER — PROTAMINE SULFATE 10 MG/ML IV SOLN
INTRAVENOUS | Status: DC | PRN
  Administered 2015-06-18: 50 mg via INTRAVENOUS

## 2015-06-18 MED ORDER — LIDOCAINE HCL (CARDIAC) 20 MG/ML IV SOLN
INTRAVENOUS | Status: DC | PRN
  Administered 2015-06-18: 100 mg via INTRAVENOUS

## 2015-06-18 MED ORDER — MIDAZOLAM HCL 2 MG/2ML IJ SOLN
INTRAMUSCULAR | Status: AC
  Filled 2015-06-18: qty 2

## 2015-06-18 MED ORDER — EPHEDRINE SULFATE 50 MG/ML IJ SOLN
INTRAMUSCULAR | Status: AC
  Filled 2015-06-18: qty 1

## 2015-06-18 MED ORDER — FENTANYL CITRATE (PF) 100 MCG/2ML IJ SOLN
25.0000 ug | INTRAMUSCULAR | Status: DC | PRN
  Administered 2015-06-18 (×2): 25 ug via INTRAVENOUS
  Administered 2015-06-18: 50 ug via INTRAVENOUS
  Administered 2015-06-18: 25 ug via INTRAVENOUS

## 2015-06-18 MED ORDER — ROCURONIUM BROMIDE 50 MG/5ML IV SOLN
INTRAVENOUS | Status: AC
  Filled 2015-06-18: qty 1

## 2015-06-18 MED ORDER — HEPARIN SODIUM (PORCINE) 1000 UNIT/ML IJ SOLN
INTRAMUSCULAR | Status: DC | PRN
  Administered 2015-06-18: 9000 [IU] via INTRAVENOUS

## 2015-06-18 MED ORDER — SUCCINYLCHOLINE CHLORIDE 20 MG/ML IJ SOLN
INTRAMUSCULAR | Status: DC | PRN
  Administered 2015-06-18: 100 mg via INTRAVENOUS

## 2015-06-18 MED ORDER — FENTANYL CITRATE (PF) 100 MCG/2ML IJ SOLN
INTRAMUSCULAR | Status: AC
  Administered 2015-06-18: 50 ug via INTRAVENOUS
  Filled 2015-06-18: qty 2

## 2015-06-18 MED ORDER — ALUM & MAG HYDROXIDE-SIMETH 200-200-20 MG/5ML PO SUSP: 15.0000 mL | ORAL | Status: DC | PRN

## 2015-06-18 MED ORDER — CEFUROXIME SODIUM 1.5 G IJ SOLR
1.5000 g | Freq: Two times a day (BID) | INTRAMUSCULAR | Status: AC
  Administered 2015-06-18 – 2015-06-19 (×2): 1.5 g via INTRAVENOUS
  Filled 2015-06-18 (×2): qty 1.5

## 2015-06-18 MED ORDER — FENTANYL CITRATE (PF) 100 MCG/2ML IJ SOLN
INTRAMUSCULAR | Status: DC | PRN
Start: 2015-06-18 — End: 2015-06-18
  Administered 2015-06-18: 50 ug via INTRAVENOUS
  Administered 2015-06-18: 150 ug via INTRAVENOUS
  Administered 2015-06-18 (×2): 50 ug via INTRAVENOUS

## 2015-06-18 MED ORDER — ONDANSETRON HCL 4 MG/2ML IJ SOLN
INTRAMUSCULAR | Status: DC | PRN
  Administered 2015-06-18: 4 mg via INTRAVENOUS

## 2015-06-18 MED ORDER — PHENOL 1.4 % MT LIQD
1.0000 | OROMUCOSAL | Status: DC | PRN
Start: 2015-06-18 — End: 2015-06-19

## 2015-06-18 MED ORDER — FENTANYL CITRATE (PF) 100 MCG/2ML IJ SOLN
INTRAMUSCULAR | Status: AC
  Filled 2015-06-18: qty 2

## 2015-06-18 MED ORDER — AMLODIPINE BESYLATE 5 MG PO TABS: 5.0000 mg | ORAL_TABLET | Freq: Every day | ORAL | Status: DC

## 2015-06-18 SURGICAL SUPPLY — 43 items
BENZOIN TINCTURE PRP APPL 2/3 (GAUZE/BANDAGES/DRESSINGS) ×3 IMPLANT
CANISTER SUCTION 2500CC (MISCELLANEOUS) ×3 IMPLANT
CANNULA VESSEL 3MM 2 BLNT TIP (CANNULA) ×6 IMPLANT
CATH ROBINSON RED A/P 18FR (CATHETERS) ×3 IMPLANT
CLIP LIGATING EXTRA MED SLVR (CLIP) ×3 IMPLANT
CLIP LIGATING EXTRA SM BLUE (MISCELLANEOUS) ×3 IMPLANT
CLOSURE WOUND 1/2 X4 (GAUZE/BANDAGES/DRESSINGS) ×1
CRADLE DONUT ADULT HEAD (MISCELLANEOUS) ×3 IMPLANT
DECANTER SPIKE VIAL GLASS SM (MISCELLANEOUS) IMPLANT
DRAIN HEMOVAC 1/8 X 5 (WOUND CARE) IMPLANT
DRSG COVADERM 4X8 (GAUZE/BANDAGES/DRESSINGS) ×3 IMPLANT
ELECT REM PT RETURN 9FT ADLT (ELECTROSURGICAL) ×3
ELECTRODE REM PT RTRN 9FT ADLT (ELECTROSURGICAL) ×1 IMPLANT
EVACUATOR SILICONE 100CC (DRAIN) IMPLANT
GAUZE SPONGE 4X4 12PLY STRL (GAUZE/BANDAGES/DRESSINGS) ×3 IMPLANT
GLOVE BIO SURGEON STRL SZ 6.5 (GLOVE) ×4 IMPLANT
GLOVE BIO SURGEONS STRL SZ 6.5 (GLOVE) ×2
GLOVE BIOGEL PI IND STRL 7.0 (GLOVE) ×2 IMPLANT
GLOVE BIOGEL PI INDICATOR 7.0 (GLOVE) ×4
GLOVE SS BIOGEL STRL SZ 7.5 (GLOVE) ×1 IMPLANT
GLOVE SUPERSENSE BIOGEL SZ 7.5 (GLOVE) ×2
GLOVE SURG SS PI 6.5 STRL IVOR (GLOVE) ×3 IMPLANT
GOWN STRL REUS W/ TWL LRG LVL3 (GOWN DISPOSABLE) ×3 IMPLANT
GOWN STRL REUS W/TWL LRG LVL3 (GOWN DISPOSABLE) ×6
KIT BASIN OR (CUSTOM PROCEDURE TRAY) ×3 IMPLANT
KIT ROOM TURNOVER OR (KITS) ×3 IMPLANT
NEEDLE 22X1 1/2 (OR ONLY) (NEEDLE) IMPLANT
NS IRRIG 1000ML POUR BTL (IV SOLUTION) ×6 IMPLANT
PACK CAROTID (CUSTOM PROCEDURE TRAY) ×3 IMPLANT
PAD ARMBOARD 7.5X6 YLW CONV (MISCELLANEOUS) ×6 IMPLANT
PATCH HEMASHIELD 8X75 (Vascular Products) ×3 IMPLANT
SHUNT CAROTID BYPASS 10 (VASCULAR PRODUCTS) ×3 IMPLANT
SPONGE INTESTINAL PEANUT (DISPOSABLE) ×3 IMPLANT
STRIP CLOSURE SKIN 1/2X4 (GAUZE/BANDAGES/DRESSINGS) ×2 IMPLANT
SUT ETHILON 3 0 PS 1 (SUTURE) IMPLANT
SUT PROLENE 6 0 CC (SUTURE) ×6 IMPLANT
SUT SILK 3 0 (SUTURE)
SUT SILK 3-0 18XBRD TIE 12 (SUTURE) IMPLANT
SUT VIC AB 3-0 SH 27 (SUTURE) ×4
SUT VIC AB 3-0 SH 27X BRD (SUTURE) ×2 IMPLANT
SUT VICRYL 4-0 PS2 18IN ABS (SUTURE) ×3 IMPLANT
SYR CONTROL 10ML LL (SYRINGE) IMPLANT
WATER STERILE IRR 1000ML POUR (IV SOLUTION) ×3 IMPLANT

## 2015-06-18 NOTE — H&P (View-Only) (Signed)
[facility]ist of [place]  Patient name: Jonathan Bowen MRN: [id number] DOB: [birth year] Sex: male  REASON FOR VISIT: Follow-up of recent hospitalization for left brain stroke.  HPI: Jonathan Bowen is a 70 y.o. male patient had presented with left brain stroke. 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 He did well in the hospital and was returned to baseline neurologic function. He is here today with his daughter states that he may have some mild confusion but is essentially back to baseline.  Past Medical History  Diagnosis Date  . Smoker   . Stroke ([place])   . Hyperlipidemia   . Hypertension     Family History  Problem Relation Age of Onset  . Diabetes Mother   . Diabetes Father   . Hypertension Mother   . Hypertension Father   . Hyperlipidemia Father   . Hyperlipidemia Mother     SOCIAL HISTORY: Social History  Substance Use Topics  . Smoking status: Former Smoker -- 3.00 packs/day    Quit date: 05/28/2015  . Smokeless tobacco: Not on file  . Alcohol Use: No    No Known Allergies  Current Outpatient Prescriptions  Medication Sig Dispense Refill  . amLODipine (NORVASC) 5 MG tablet Take 1 tablet (5 mg total) by mouth daily. 30 tablet 0  . aspirin EC 81 MG tablet Take 81 mg by mouth daily.    Marland Kitchen atorvastatin (LIPITOR) 80 MG tablet Take 1 tablet (80 mg total) by mouth daily at 6 PM. 30 tablet 0  . clopidogrel (PLAVIX) 75 MG tablet Take 1 tablet (75 mg total) by mouth daily. 30 tablet 2  . nicotine (NICODERM CQ - DOSED IN MG/24 HOURS) 21 mg/24hr patch Place 1 patch (21 mg total) onto the skin daily. 28 patch 0   No current facility-administered medications for this visit.    REVIEW OF SYSTEMS:  '[X]'$  denotes positive finding, '[ ]'$  denotes negative finding Cardiac  Comments:  Chest pain or chest pressure:    Shortness of breath upon exertion:     Short of breath when lying flat:    Irregular heart rhythm:        Vascular    Pain in calf, thigh, or hip brought on by ambulation:    Pain in feet at night that wakes you up from your sleep:     Blood clot in your veins: x   Leg swelling:         Pulmonary    Oxygen at home:    Productive cough:     Wheezing:         Neurologic    Sudden weakness in arms or legs:  x   Sudden numbness in arms or legs:     Sudden onset of difficulty speaking or slurred speech: x   Temporary loss of vision in one eye:     Problems with dizziness:         Gastrointestinal    Blood in stool:     Vomited blood:         Genitourinary    Burning when urinating:     Blood in urine:        Psychiatric    Major depression:         Hematologic    Bleeding problems:    Problems with blood clotting too easily:        Skin  Rashes or ulcers:        Constitutional    Fever or chills:      PHYSICAL EXAM: Filed Vitals:   06/08/15 1503  BP: 147/72  Pulse: 66  Temp: 99 F (37.2 C)  TempSrc: Oral  Resp: 18  Height: 5' 7.5" (1.715 m)  Weight: 226 lb (102.513 kg)  SpO2: 98%    GENERAL: The patient is a well-nourished male, in no acute distress. The vital signs are documented above. CARDIAC: There is a regular rate and rhythm.  VASCULAR:  Plus radial pulses bilaterally, no carotid bruits noted PULMONARY: There is good air exchange bilaterally without wheezing or rales.  MUSCULOSKELETAL: There are no major deformities or cyanosis. NEUROLOGIC: No focal weakness or paresthesias are detected. SKIN: There are no ulcers or rashes noted. PSYCHIATRIC: The patient has a normal affect.  DATA:   reviewed his CT scan from his recent admission. Does show a high-grade carotid stenoses bilaterally at the bifurcation and intracranial left occlusion.  MEDICAL ISSUES:  discussed the recommendation for right carotid endarterectomy. Discussed 12% risk of stroke with surgery. He understands and wished  to proceed as soon as possible. He will continue his Plavix through the procedure with his recent neurologic event.  No Follow-up on file.   Curt Jews [facility] [place]: [phone]

## 2015-06-18 NOTE — Anesthesia Procedure Notes (Signed)
Procedure Name: Intubation Date/Time: 06/18/2015 7:40 AM Performed by: Tressia Miners LEFFEW Pre-anesthesia Checklist: Patient identified, Patient being monitored, Timeout performed, Emergency Drugs available and Suction available Patient Re-evaluated:Patient Re-evaluated prior to inductionOxygen Delivery Method: Circle System Utilized Preoxygenation: Pre-oxygenation with 100% oxygen Intubation Type: IV induction Ventilation: Mask ventilation without difficulty Laryngoscope Size: Mac and 3 Grade View: Grade I Tube type: Oral Tube size: 7.5 mm Number of attempts: 1 Airway Equipment and Method: Stylet Placement Confirmation: ETT inserted through vocal cords under direct vision,  positive ETCO2 and breath sounds checked- equal and bilateral Secured at: 20 cm Tube secured with: Tape Dental Injury: Teeth and Oropharynx as per pre-operative assessment

## 2015-06-18 NOTE — Progress Notes (Signed)
Family still not here-pt updated by volunteer. She will bring them back when they return. Jonathan Bowen at bedside.

## 2015-06-18 NOTE — Progress Notes (Signed)
[facility] [place]  Subjective  - Doing well over all.   Objective 130/59 74 97.8 F (36.6 C) (Oral) 23 99%  Intake/Output Summary (Last 24 hours) at 06/18/15 1544 Last data filed at 06/18/15 1330  Gross per 24 hour  Intake   1100 ml  Output    275 ml  Net    825 ml    Palpable radial pulses Left dressing with minimal bloody drainage Grip 5/5 equal bilateral Smile symmetric, no tongue deviation  Assessment/Planning: S/p right carotid Endarterectomy with Dacron patch angioplasty  Disposition on stable waiting for a bed on 3S    Laurence Slate [facility] 06/18/2015 3:44 PM --  Laboratory Lab Results: No results for input(s): WBC, HGB, HCT, PLT in the last 72 hours. BMET No results for input(s): NA, K, CL, CO2, GLUCOSE, BUN, CREATININE, CALCIUM in the last 72 hours.  COAG Lab Results  Component Value Date   INR 1.01 06/15/2015   INR 1.05 05/21/2015   No results found for: PTT

## 2015-06-18 NOTE — Progress Notes (Signed)
  Day of Surgery Note    Subjective:  No complaints  Filed Vitals:   06/18/15 1100 06/18/15 1115  BP: 107/58 116/63  Pulse: 65 60  Temp:    Resp: 14 15    Incisions:   Bandage is clean with only scant drainage Extremities:  Moving all extremities equally Lungs:  Non labored Neuro:  In tact; tongue midline  Assessment/Plan:  This is a 70 y.o. male who is s/p right carotid endarterectomy  -pt doing well in pacu and neurologically in tact -anticipate d/c home tomorrow -Rx given for Norvasc and Lipitor as pt does not have any refills and has appt to see PCP in 3 weeks. (#30 NR) -to [place] when bed available.   Leontine Locket, PA-C 06/18/2015 11:56 AM

## 2015-06-18 NOTE — Transfer of Care (Signed)
Immediate Anesthesia Transfer of Care Note  Patient: Jonathan Bowen  Procedure(s) Performed: Procedure(s): ENDARTERECTOMY RIGHT CAROTID (Right) PATCH ANGIOPLASTY RIGHT CAROTID ARTERY (Right)  Patient Location: PACU  Anesthesia Type:General  Level of Consciousness: awake, alert , oriented, patient cooperative and responds to stimulation  Airway & Oxygen Therapy: Patient Spontanous Breathing and Patient connected to nasal cannula oxygen  Post-op Assessment: Report given to RN, Post -op Vital signs reviewed and stable, Patient moving all extremities X 4 and Patient able to stick tongue midline  Post vital signs: Reviewed and stable  Last Vitals:  Filed Vitals:   06/18/15 0603 06/18/15 1011  BP: 198/68   Pulse: 67   Temp: 36.4 C 36.5 C  Resp: 20     Complications: No apparent anesthesia complications

## 2015-06-18 NOTE — Interval H&P Note (Signed)
History and Physical Interval Note:  06/18/2015 6:48 AM  Tally Due  has presented today for surgery, with the diagnosis of Right carotid artery stenosis I65.21  The various methods of treatment have been discussed with the patient and family. After consideration of risks, benefits and other options for treatment, the patient has consented to  Procedure(s): ENDARTERECTOMY CAROTID (Right) as a surgical intervention .  The patient's history has been reviewed, patient examined, no change in status, stable for surgery.  I have reviewed the patient's chart and labs.  Questions were answered to the patient's satisfaction.     Curt Jews

## 2015-06-18 NOTE — Anesthesia Postprocedure Evaluation (Signed)
Anesthesia Post Note  Patient: [name]  Procedure(s) Performed: Procedure(s) (LRB): ENDARTERECTOMY RIGHT CAROTID (Right) PATCH ANGIOPLASTY RIGHT CAROTID ARTERY (Right)  Patient location during evaluation: PACU Anesthesia Type: General Level of consciousness: awake and alert Pain management: pain level controlled Vital Signs Assessment: post-procedure vital signs reviewed and stable Respiratory status: spontaneous breathing, nonlabored ventilation, respiratory function stable and patient connected to nasal cannula oxygen Cardiovascular status: blood pressure returned to baseline and stable Postop Assessment: no signs of nausea or vomiting Anesthetic complications: no    Last Vitals:  Filed Vitals:   06/18/15 1011 06/18/15 1015  BP:  110/83  Pulse: 78 75  Temp: 36.5 C   Resp:  18    Last Pain:  Filed Vitals:   06/18/15 1024  PainSc: 0-No pain                 Zenaida Deed

## 2015-06-18 NOTE — Anesthesia Preprocedure Evaluation (Signed)
Anesthesia Evaluation  Patient identified by MRN, date of birth, ID band Patient awake    Reviewed: Allergy & Precautions, H&P , NPO status , Patient's Chart, lab work & pertinent test results  History of Anesthesia Complications (+) history of anesthetic complications  Airway Mallampati: II  TM Distance: >3 FB Neck ROM: full    Dental no notable dental hx.    Pulmonary former smoker,    Pulmonary exam normal breath sounds clear to auscultation       Cardiovascular hypertension, On Medications Normal cardiovascular exam Rhythm:regular Rate:Normal  Echo 55% with no sig valve abnormalities, diastolic dysfunction   Neuro/Psych CVA, No Residual Symptoms    GI/Hepatic negative GI ROS, Neg liver ROS,   Endo/Other  negative endocrine ROS  Renal/GU negative Renal ROS     Musculoskeletal   Abdominal   Peds  Hematology negative hematology ROS (+)   Anesthesia Other Findings   Reproductive/Obstetrics negative OB ROS                             Anesthesia Physical Anesthesia Plan  ASA: III  Anesthesia Plan: General   Post-op Pain Management:    Induction: Intravenous  Airway Management Planned: Oral ETT  Additional Equipment: Arterial line  Intra-op Plan:   Post-operative Plan: Extubation in OR  Informed Consent: I have reviewed the patients History and Physical, chart, labs and discussed the procedure including the risks, benefits and alternatives for the proposed anesthesia with the patient or authorized representative who has indicated his/her understanding and acceptance.   Dental Advisory Given  Plan Discussed with: Anesthesiologist, CRNA and Surgeon  Anesthesia Plan Comments: (Patient with bilateral carotid artery stenosis, left and right and bifurcation, does have intact verterbral artery bilateral and intact circle of willis from CT angio of head with intact posterior  circulation  GA with ETT, A line, PIV.. Will plan for increase in MAP of 20% from baseline during carotid cross clamp given severity of carotid disease)        Anesthesia Quick Evaluation

## 2015-06-18 NOTE — Progress Notes (Signed)
Volunteer had paged & talked to a family member-they have left the hospital. Jeanine Luz told them pt req. Visitors. She will let us know when they return.

## 2015-06-18 NOTE — Op Note (Signed)
     Patient name: Jonathan Bowen MRN: [id number] DOB: Oct 15, 1945 Sex: male  06/18/2015 Pre-operative Diagnosis: Asymptomatic right carotid stenosis Post-operative diagnosis:  Same Surgeon:  Rosetta Posner, M.D. Assistants:  Rhyne,PA Procedure:    right carotid Endarterectomy with Dacron patch angioplasty Anesthesia:  General Blood Loss:  See anesthesia record Specimens:  Carotid Plaque to pathology  Indications for surgery:  Patient is a 70 year old gentleman who had a left brain stroke approximately 2 weeks ago. Workup revealed occlusion of his left internal carotid artery at this carotid siphon. He had high-grade bilateral carotid bifurcation stenosis. He is taken to the operative room for right endarterectomy for his safety asymptomatic high-grade right carotid stenosis  Procedure in detail:  The patient was taken to the operating and placed in the supine position. The neck was prepped and draped in the usual sterile fashion. An incision was made anterior to the sternocleidomastoid muscle and continued with electrocautery through the platysma muscle. The muscle was retracted posteriorly and the carotid sheath was opened. The facial vein was ligated with 2-0 silk ties and divided. The common carotid artery was encircled with an umbilical tape and Rummel tourniquet. Dissection was continued onto the carotid bifurcation. The superior thyroid artery was controlled with a 2-0 silk Potts tie. The external carotid organ was encircled with a vessel loop and the internal carotid was encircled with umbilical tape and Rummel tourniquet. The hypoglossal and vagus nerves were identified and preserved.  The patient was given systemic heparinization. After adequate circulation time, the internal,external and common carotid arteries were occluded. The common carotid was opened with an 11 blade and the arteriotomy was continued with Potts scissors onto the internal carotid artery. A 10 shunt was passed up the internal  carotid artery, allowed to back bleed, and then passed down the common carotid artery. The shunt was secured with Rummel tourniquet. The endarterectomy was begun on the common carotid artery  plaque was divided proximally with Potts scissors. The endarterectomy was continued onto the carotid bifurcation. The external carotid was endarterectomized by eversion technique and the internal carotid artery was endarterectomized in an open fashion. Remaining debris was removed from the endarterectomy plane. A Dacron patch was brought to the field and sewn as a patch angioplasty. Prior to completing the anastomosis, the shunt was removed and the usual flushing maneuvers were undertaken. The anastomosis was then completed and flow was restored first to the external and then the internal carotid artery. Excellent flow characteristics were noted with hand-held Doppler in the internal and external carotid arteries.  The patient was given protamine to reverse the heparin. Hemostasis was obtained with electrocautery. The wounds were irrigated with saline. The wound was closed by first reapproximating the sternocleidomastoid muscle over the carotid artery with interrupted 3-0 Vicryl sutures. Next, the platysma was closed with a running 3-0 Vicryl suture. The skin was closed with a 4-0 subcuticular Vicryl suture. Benzoin and Steri-Strips were applied to the incision. A sterile dressing was placed over the incision. All sponge and needle counts were correct. The patient was awakened in the operating room, neurologically intact. They were transferred to the PACU in stable condition.  Carotid stenosis at surgery: Greater than 80%  Disposition:  To PACU in stable condition,neurologically intact  Relevant Operative Details:  Internal carotid plaque extended up into the internal carotid artery above the bifurcation  Rosetta Posner, M.D. [facility] Office: [phone] Pager:  [phone]

## 2015-06-19 LAB — BASIC METABOLIC PANEL
ANION GAP: 8 (ref 5–15)
BUN: 14 mg/dL (ref 6–20)
CHLORIDE: 108 mmol/L (ref 101–111)
CO2: 25 mmol/L (ref 22–32)
Calcium: 8.7 mg/dL — ABNORMAL LOW (ref 8.9–10.3)
Creatinine, Ser: 0.85 mg/dL (ref 0.61–1.24)
GFR calc non Af Amer: >60 mL/min (ref >60)
Glucose, Bld: 98 mg/dL (ref 65–99)
Potassium: 4 mmol/L (ref 3.5–5.1)
Sodium: 141 mmol/L (ref 135–145)

## 2015-06-19 LAB — CBC
HEMATOCRIT: 38.7 % — AB (ref 39.0–52.0)
HEMOGLOBIN: 12.1 g/dL — AB (ref 13.0–17.0)
MCH: 28.6 pg (ref 26.0–34.0)
MCHC: 31.3 g/dL (ref 30.0–36.0)
MCV: 91.5 fL (ref 78.0–100.0)
Platelets: 178 10*3/uL (ref 150–400)
RBC: 4.23 MIL/uL (ref 4.22–5.81)
RDW: 13.8 % (ref 11.5–15.5)
WBC: 9.4 10*3/uL (ref 4.0–10.5)

## 2015-06-19 MED ORDER — ZOLPIDEM TARTRATE 5 MG PO TABS
5.0000 mg | ORAL_TABLET | Freq: Once | ORAL | Status: AC
  Administered 2015-06-19: 5 mg via ORAL
  Filled 2015-06-19: qty 1

## 2015-06-19 NOTE — Progress Notes (Signed)
Removed pt's PIV's and telemonitor, reviewed discharge instructions with pt and his daughter including prescriptions and making appt to follow up with Dr Early in two weeks. Reviewed incision care. Pt and daughter verbalized understanding. Pt discharged in wheelchair. Consuelo Pandy RN

## 2015-06-19 NOTE — Progress Notes (Signed)
    Subjective  - POD #1  No complaints Walking and eating without trouble   Physical Exam:  Incision C/D/I Neuro intact       Assessment/Plan:  POD #1, s/p R CEA  Discharge home  Annamarie Major 06/19/2015 8:10 AM --  Filed Vitals:   06/19/15 0120 06/19/15 0350  BP: 150/62 140/51  Pulse: 70 70  Temp:  98.5 F (36.9 C)  Resp: 18 18    Intake/Output Summary (Last 24 hours) at 06/19/15 0810 Last data filed at 06/19/15 0700  Gross per 24 hour  Intake   2622 ml  Output    850 ml  Net   1772 ml     Laboratory CBC    Component Value Date/Time   WBC 9.4 06/19/2015 0437   HGB 12.1* 06/19/2015 0437   HCT 38.7* 06/19/2015 0437   PLT 178 06/19/2015 0437    BMET    Component Value Date/Time   NA 141 06/19/2015 0437   K 4.0 06/19/2015 0437   CL 108 06/19/2015 0437   CO2 25 06/19/2015 0437   GLUCOSE 98 06/19/2015 0437   BUN 14 06/19/2015 0437   CREATININE 0.85 06/19/2015 0437   CALCIUM 8.7* 06/19/2015 0437   GFRNONAA >60 06/19/2015 0437   GFRAA >60 06/19/2015 0437    COAG Lab Results  Component Value Date   INR 1.01 06/15/2015   INR 1.05 05/21/2015   No results found for: PTT  Antibiotics Anti-infectives    Start     Dose/Rate Route Frequency Ordered Stop   06/18/15 1930  cefUROXime (ZINACEF) 1.5 g in dextrose 5 % 50 mL IVPB     1.5 g 100 mL/hr over 30 Minutes Intravenous Every 12 hours 06/18/15 1729 06/19/15 0718   06/18/15 0600  cefUROXime (ZINACEF) 1.5 g in dextrose 5 % 50 mL IVPB     1.5 g 100 mL/hr over 30 Minutes Intravenous To ShortStay Surgical 06/17/15 1131 06/18/15 0742       V. Leia Alf, M.D. [facility] Office: [phone] Pager:  [phone]

## 2015-06-21 ENCOUNTER — Encounter (HOSPITAL_COMMUNITY): Payer: Self-pay | Admitting: Vascular Surgery

## 2015-06-21 ENCOUNTER — Encounter: Payer: Self-pay | Admitting: Vascular Surgery

## 2015-06-21 ENCOUNTER — Encounter: Payer: Self-pay | Admitting: Family

## 2015-06-21 NOTE — Telephone Encounter (Signed)
Returned call to pt's daughter, Jonathan Bowen.  Reported approx. 1 cm. small opening in lower end, of right neck incision.  Stated there is "yellow" tissue visible.  Denied redness, or drainage.  Stated the pt. denied fever or chills.  Reported that the pt. Is very worried about the above area.  Appt. offered at 10:15 AM with NP for incision check.  Daughter agreed.

## 2015-06-22 ENCOUNTER — Encounter: Payer: Self-pay | Admitting: Family

## 2015-06-22 ENCOUNTER — Ambulatory Visit (INDEPENDENT_AMBULATORY_CARE_PROVIDER_SITE_OTHER): Payer: Medicare Other | Admitting: Family

## 2015-06-22 VITALS — BP 149/75 | HR 76 | Temp 98.2°F | Resp 18 | Ht 67.0 in | Wt 223.0 lb

## 2015-06-22 DIAGNOSIS — Z9889 Other specified postprocedural states: Secondary | ICD-10-CM

## 2015-06-22 DIAGNOSIS — I6521 Occlusion and stenosis of right carotid artery: Secondary | ICD-10-CM

## 2015-06-22 NOTE — Progress Notes (Signed)
Postoperative Visit   History of Present Illness  Jonathan Bowen is a 70 y.o. male patient of Dr. Donnetta Hutching who is s/p right CEA on 06/18/15. He comes in today with c/o concern about what he describes as an opening in his right CEA incision, denies drainage, denies fever or chills. He denies difficulties with swallowing. He is here today with his grandson who drove him here.  The patient's neck incision is well healed.  The patient has had no stroke or TIA symptoms since his CEA.  Preoperatively he had a left brain stroke. 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 He did well in the hospital and was returned to baseline neurologic function.  For VQI Use Only  PRE-ADM LIVING: Home  AMB STATUS: Ambulatory  Social History   Social History  . Marital Status: Widowed    Spouse Name: N/A  . Number of Children: N/A  . Years of Education: N/A   Occupational History  . Not on file.   Social History Main Topics  . Smoking status: Former Smoker -- 3.00 packs/day for 11 years    Quit date: 05/28/2015  . Smokeless tobacco: Not on file  . Alcohol Use: No  . Drug Use: No  . Sexual Activity: Not Currently   Other Topics Concern  . Not on file   Social History Narrative    Current Outpatient Prescriptions on File Prior to Visit  Medication Sig Dispense Refill  . amLODipine (NORVASC) 5 MG tablet Take 1 tablet (5 mg total) by mouth daily. 30 tablet 0  . aspirin EC 81 MG tablet Take 81 mg by mouth daily.    Marland Kitchen atorvastatin (LIPITOR) 80 MG tablet Take 1 tablet (80 mg total) by mouth daily at 6 PM. 30 tablet 0  . clopidogrel (PLAVIX) 75 MG tablet Take 1 tablet (75 mg total) by mouth daily. 30 tablet 2  . nicotine (NICODERM CQ - DOSED IN MG/24 HOURS) 21 mg/24hr patch Place 1 patch (21 mg total) onto the skin daily. 28 patch 0  . oxyCODONE (ROXICODONE) 5 MG immediate  release tablet Take 1 tablet (5 mg total) by mouth every 6 (six) hours as needed. (Patient not taking: Reported on 06/22/2015) 20 tablet 0   No current facility-administered medications on file prior to visit.    Physical Examination  Filed Vitals:   06/22/15 0946 06/22/15 0953  BP: 152/75 149/75  Pulse: 76   Temp: 98.2 F (36.8 C)   TempSrc: Oral   Resp: 18   Height: '5\' 7"'$  (1.702 m)   Weight: 223 lb (101.152 kg)   SpO2: 97%    Body mass index is 34.92 kg/(m^2).  Right Neck: Incision is well healed, no opening, small amount swelling at proximal end of incision, no drainage.  Neuro: CN 2-12 are intact, Motor strength is 4/5 in right UE, 5/5 in right LE and left UE and left LE, sensation is grossly intact  Medical Decision Making  Jonathan Bowen is a 70 y.o. male who presents s/p right CEA on 06/18/15.  The patient's neck incision is healing with no postoperative stroke symptoms. I discussed in depth with the patient the nature of atherosclerosis, and emphasized the importance of maximal medical management including strict control of blood pressure, blood glucose, and lipid levels, obtaining regular exercise, anti-platelet use and cessation of smoking.   The patient is currently on an antiplatelet:  ASA and Plavix. The patient is currently on a statin: atorvastatin.   After discussing with Dr Kellie Simmering, the pt will follow up with Dr. Donnetta Hutching in 2 weeks for post-operative evaluation.  Thank you for allowing [place] to participate in this patient's care.  Shadia Larose, Sharmon Leyden, RN, MSN, FNP-C [facility] Office: [phone]  06/22/2015, 10:05 AM  Clinic MD: Kellie Simmering

## 2015-06-22 NOTE — Progress Notes (Signed)
Filed Vitals:   06/22/15 0946 06/22/15 0953  BP: 152/75 149/75  Pulse: 76   Temp: 98.2 F (36.8 C)   TempSrc: Oral   Resp: 18   Height: '5\' 7"'$  (1.702 m)   Weight: 223 lb (101.152 kg)   SpO2: 97%

## 2015-06-24 DIAGNOSIS — I69391 Dysphagia following cerebral infarction: Secondary | ICD-10-CM | POA: Diagnosis not present

## 2015-06-24 DIAGNOSIS — R1310 Dysphagia, unspecified: Secondary | ICD-10-CM | POA: Diagnosis not present

## 2015-06-24 DIAGNOSIS — I69351 Hemiplegia and hemiparesis following cerebral infarction affecting right dominant side: Secondary | ICD-10-CM | POA: Diagnosis not present

## 2015-06-24 DIAGNOSIS — N390 Urinary tract infection, site not specified: Secondary | ICD-10-CM | POA: Diagnosis not present

## 2015-06-24 DIAGNOSIS — I69392 Facial weakness following cerebral infarction: Secondary | ICD-10-CM | POA: Diagnosis not present

## 2015-06-24 DIAGNOSIS — I69320 Aphasia following cerebral infarction: Secondary | ICD-10-CM | POA: Diagnosis not present

## 2015-06-28 NOTE — Discharge Summary (Signed)
[facility] Discharge Summary   Patient ID:  Rajiv Parlato MRN: [id number] DOB/AGE: 1945/10/01 70 y.o.  Admit date: 06/18/2015 Discharge date: 06/28/2015 Date of Surgery: 06/18/2015 Surgeon: Surgeon(s): Rosetta Posner, MD  Admission Diagnosis: Right carotid artery stenosis I65.21  Discharge Diagnoses:  Right carotid artery stenosis I65.21  Secondary Diagnoses: Past Medical History  Diagnosis Date  . Smoker   . Hyperlipidemia   . Hypertension   . Complication of anesthesia     has never had surgery  . Stroke [facility])     just happened in early February    Procedure(s): ENDARTERECTOMY RIGHT CAROTID PATCH ANGIOPLASTY RIGHT CAROTID ARTERY  Discharged Condition: good  HPI: Jonathan Bowen is a 70 y.o. male patient had presented with left brain stroke. 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 He did well in the hospital and was returned to baseline neurologic function. He is here today with his daughter states that he may have some mild confusion but is essentially back to baseline.   Hospital Course:  POD# 1  Incisions: Bandage is clean with only scant drainage Extremities: Moving all extremities equally Lungs: Non labored Neuro: In tact; tongue midline    Rx given for Norvasc and Lipitor as pt does not have any refills and has appt to see PCP in 3 weeks. (#30 NR) Uneventful stay over night Discharge home in stable condition     Significant Diagnostic Studies: CBC Lab Results  Component Value Date   WBC 9.4 06/19/2015   HGB 12.1* 06/19/2015   HCT 38.7* 06/19/2015   MCV 91.5 06/19/2015   PLT 178 06/19/2015    BMET    Component Value Date/Time   NA 141 06/19/2015 0437   K 4.0 06/19/2015 0437   CL 108 06/19/2015 0437   CO2 25 06/19/2015 0437   GLUCOSE 98 06/19/2015 0437   BUN 14 06/19/2015 0437   CREATININE 0.85 06/19/2015 0437    CALCIUM 8.7* 06/19/2015 0437   GFRNONAA >60 06/19/2015 0437   GFRAA >60 06/19/2015 0437   COAG Lab Results  Component Value Date   INR 1.01 06/15/2015   INR 1.05 05/21/2015     Disposition:  Discharge to :Home Discharge Instructions    CAROTID Sugery: Call MD for difficulty swallowing or speaking; weakness in arms or legs that is a new symtom; severe headache.  If you have increased swelling in the neck and/or  are having difficulty breathing, CALL 911    Complete by:  As directed      Call MD for:  redness, tenderness, or signs of infection (pain, swelling, bleeding, redness, odor or green/yellow discharge around incision site)    Complete by:  As directed      Call MD for:  severe or increased pain, loss or decreased feeling  in affected limb(s)    Complete by:  As directed      Call MD for:  temperature >100.5    Complete by:  As directed      Discharge patient    Complete by:  As directed   Discharge pt to home     Discharge wound care:    Complete by:  As directed   Shower daily with soap and water starting 06/20/15     Driving Restrictions    Complete by:  As directed   No driving for 2 weeks     Lifting restrictions  Complete by:  As directed   No lifting for 2 weeks     Resume previous diet    Complete by:  As directed             Medication List    TAKE these medications        amLODipine 5 MG tablet  Commonly known as:  NORVASC  Take 1 tablet (5 mg total) by mouth daily.     aspirin EC 81 MG tablet  Take 81 mg by mouth daily.     atorvastatin 80 MG tablet  Commonly known as:  LIPITOR  Take 1 tablet (80 mg total) by mouth daily at 6 PM.     clopidogrel 75 MG tablet  Commonly known as:  PLAVIX  Take 1 tablet (75 mg total) by mouth daily.     nicotine 21 mg/24hr patch  Commonly known as:  NICODERM CQ - dosed in mg/24 hours  Place 1 patch (21 mg total) onto the skin daily.     oxyCODONE 5 MG immediate release tablet  Commonly known as:   ROXICODONE  Take 1 tablet (5 mg total) by mouth every 6 (six) hours as needed.       Verbal and written Discharge instructions given to the patient. Wound care per Discharge AVS     Follow-up Information    Follow up with Curt Jews, MD In 2 weeks.   Specialties:  Vascular Surgery, Cardiology   Why:  Office will call you to arrange your appt (sent)   Contact information:   [place] [address] [phone]       Signed: Laurence Slate [facility] 06/28/2015, 9:53 AM  --- For VQI Registry use --- Instructions: Press F2 to tab through selections.  Delete question if not applicable.   Modified Rankin score at D/C (0-6): Rankin Score=0  IV medication needed for:  1. Hypertension: No 2. Hypotension: No  Post-op Complications: No  1. Post-op CVA or TIA: No  If yes: Event classification (right eye, left eye, right cortical, left cortical, verterobasilar, other):   If yes: Timing of event (intra-op, <6 hrs post-op, >=6 hrs post-op, unknown):   2. CN injury: No  If yes: CN  injuried   3. Myocardial infarction: No  If yes: Dx by (EKG or clinical, Troponin):   4.  CHF: No  5.  Dysrhythmia (new): No  6. Wound infection: No  7. Reperfusion symptoms: No  8. Return to OR: No  If yes: return to OR for (bleeding, neurologic, other CEA incision, other):   Discharge medications: Statin use:  Yes ASA use:  Yes Beta blocker use:  No  for medical reason   ACE-Inhibitor use:  No  for medical reason   P2Y12 Antagonist use: '[ ]'$  None, [x ] Plavix, '[ ]'$  Plasugrel, '[ ]'$  Ticlopinine, '[ ]'$  Ticagrelor, '[ ]'$  Other, '[ ]'$  No for medical reason, '[ ]'$  Non-compliant, '[ ]'$  Not-indicated Anti-coagulant use:  '[ ]'$  None, '[ ]'$  Warfarin, '[ ]'$  Rivaroxaban, '[ ]'$  Dabigatran, '[ ]'$  Other, '[ ]'$  No for medical reason, '[ ]'$  Non-compliant, '[ ]'$  Not-indicated

## 2015-06-29 ENCOUNTER — Encounter: Payer: Self-pay | Admitting: Internal Medicine

## 2015-06-29 ENCOUNTER — Ambulatory Visit (INDEPENDENT_AMBULATORY_CARE_PROVIDER_SITE_OTHER): Payer: Medicare Other | Admitting: Internal Medicine

## 2015-06-29 VITALS — BP 155/83 | HR 72 | Temp 97.4°F | Ht 66.75 in | Wt 225.0 lb

## 2015-06-29 DIAGNOSIS — I10 Essential (primary) hypertension: Secondary | ICD-10-CM

## 2015-06-29 DIAGNOSIS — I69391 Dysphagia following cerebral infarction: Secondary | ICD-10-CM | POA: Diagnosis not present

## 2015-06-29 DIAGNOSIS — Z8673 Personal history of transient ischemic attack (TIA), and cerebral infarction without residual deficits: Secondary | ICD-10-CM | POA: Insufficient documentation

## 2015-06-29 DIAGNOSIS — I6521 Occlusion and stenosis of right carotid artery: Secondary | ICD-10-CM

## 2015-06-29 DIAGNOSIS — I639 Cerebral infarction, unspecified: Secondary | ICD-10-CM | POA: Diagnosis not present

## 2015-06-29 DIAGNOSIS — Z720 Tobacco use: Secondary | ICD-10-CM

## 2015-06-29 DIAGNOSIS — E785 Hyperlipidemia, unspecified: Secondary | ICD-10-CM

## 2015-06-29 DIAGNOSIS — I69359 Hemiplegia and hemiparesis following cerebral infarction affecting unspecified side: Secondary | ICD-10-CM

## 2015-06-29 DIAGNOSIS — I69320 Aphasia following cerebral infarction: Secondary | ICD-10-CM | POA: Diagnosis not present

## 2015-06-29 MED ORDER — NICOTINE 14 MG/24HR TD PT24: 14.0000 mg | MEDICATED_PATCH | Freq: Every day | TRANSDERMAL | Status: DC

## 2015-06-29 NOTE — Progress Notes (Signed)
Pre visit review using our clinic review tool, if applicable. No additional management support is needed unless otherwise documented below in the visit note. 

## 2015-06-29 NOTE — Progress Notes (Signed)
Subjective:    Patient ID: Jonathan Bowen, male    DOB: [birth year], 70 y.o.   MRN: [id number]  HPI  69YO male presents to establish care.  05/2015 - Developed right arm numbness and slurred speech while chopping wood. Daughter noted slurred speech and right facial droop. She took him to ER. CT showed large left brain CVA: Wedge-shaped hypodensity involving the left insular cortex, external capsule, adjacent basal ganglia, and posterior limb of the internal capsule extending into the deep white matter compatible with an acute to subacute infarct. No associated intracranial hemorrhage, mass effect, or midline shift.  He was admitted for further evaluation. MRI BRAIN: Multifocal moderate acute LEFT middle cerebral artery territory infarct without hemorrhagic conversion.  Slow flow versus occluded LEFT carotid terminus and MCA corresponding to known angiographic abnormality.  CT Angio showed bilateral plaque with ulcerated plaque in right CA.   H underwent right CEA.  Doing well at home since discharge. Has home PT. Strength improving. Swallowing improved. Completes all ADLs. Compliant with medication.  No concerns today. Has refrained from smoking. Using '21mg'$  Nicoderm. Would like to taper dose.   Wt Readings from Last 3 Encounters:  06/29/15 225 lb (102.059 kg)  06/22/15 223 lb (101.152 kg)  06/18/15 224 lb 13.9 oz (102 kg)   BP Readings from Last 3 Encounters:  06/29/15 155/83  06/22/15 149/75  06/19/15 164/55    Past Medical History  Diagnosis Date  . Smoker   . Hyperlipidemia   . Hypertension   . Complication of anesthesia     has never had surgery  . Stroke [facility])     just happened in early February   Family History  Problem Relation Age of Onset  . Diabetes Mother   . Cancer Father     Unknown of the type of cancer   . Diabetes Sister   . ALS Brother    Past Surgical History  Procedure Laterality Date  . Endarterectomy Right 06/18/2015    Procedure:  ENDARTERECTOMY RIGHT CAROTID;  Surgeon: Rosetta Posner, MD;  Location: [facility];  Service: Vascular;  Laterality: Right;  . Patch angioplasty Right 06/18/2015    Procedure: PATCH ANGIOPLASTY RIGHT CAROTID ARTERY;  Surgeon: Rosetta Posner, MD;  Location: [facility] OR;  Service: Vascular;  Laterality: Right;   Social History   Social History  . Marital Status: Widowed    Spouse Name: N/A  . Number of Children: N/A  . Years of Education: N/A   Social History Main Topics  . Smoking status: Former Smoker -- 3.00 packs/day for 11 years    Quit date: 05/28/2015  . Smokeless tobacco: None  . Alcohol Use: No  . Drug Use: No  . Sexual Activity: Not Currently   Other Topics Concern  . None   Social History Narrative   Lives with son in [place]. Single, divorced. No pets in home.      Work - Retired Dealer.      Diet - regular      Exercise - limited.    Review of Systems  Constitutional: Negative for fever, chills, activity change, appetite change, fatigue and unexpected weight change.  HENT: Positive for trouble swallowing. Negative for congestion, rhinorrhea, sinus pressure, sore throat and voice change.   Eyes: Negative for visual disturbance.  Respiratory: Negative for cough and shortness of breath.   Cardiovascular: Negative for chest pain, palpitations and leg swelling.  Gastrointestinal: Negative for nausea, vomiting, abdominal pain, diarrhea, constipation and abdominal distention.  Genitourinary: Negative for dysuria, urgency and difficulty urinating.  Musculoskeletal: Negative for arthralgias and gait problem.  Skin: Positive for color change. Negative for rash.  Neurological: Positive for facial asymmetry (with stroke, now resolved), speech difficulty, weakness and numbness. Negative for dizziness, light-headedness and headaches.  Hematological: Negative for adenopathy.  Psychiatric/Behavioral: Negative for sleep disturbance and dysphoric mood. The patient is not nervous/anxious.         Objective:    BP 155/83 mmHg  Pulse 72  Temp(Src) 97.4 F (36.3 C) (Oral)  Ht 5' 6.75" (1.695 m)  Wt 225 lb (102.059 kg)  BMI 35.52 kg/m2  SpO2 97% Physical Exam  Constitutional: He is oriented to person, place, and time. He appears well-developed and well-nourished. No distress.  HENT:  Head: Normocephalic and atraumatic.  Right Ear: External ear normal.  Left Ear: External ear normal.  Nose: Nose normal.  Mouth/Throat: Oropharynx is clear and moist. No oropharyngeal exudate.  Eyes: Conjunctivae and EOM are normal. Pupils are equal, round, and reactive to light. Right eye exhibits no discharge. Left eye exhibits no discharge. No scleral icterus.  Neck: Normal range of motion. Neck supple. No tracheal deviation present. No thyromegaly present.    Cardiovascular: Normal rate, regular rhythm and normal heart sounds.  Exam reveals no gallop and no friction rub.   No murmur heard. Pulmonary/Chest: Effort normal and breath sounds normal. No accessory muscle usage. No tachypnea. No respiratory distress. He has no decreased breath sounds. He has no wheezes. He has no rhonchi. He has no rales. He exhibits no tenderness.  Musculoskeletal: Normal range of motion. He exhibits no edema.  Lymphadenopathy:    He has no cervical adenopathy.  Neurological: He is alert and oriented to person, place, and time. No cranial nerve deficit. Coordination normal.  Skin: Skin is warm and dry. No rash noted. He is not diaphoretic. No erythema. No pallor.  Psychiatric: He has a normal mood and affect. His behavior is normal. Judgment and thought content normal.          Assessment & Plan:   Problem List Items Addressed This Visit      Unprioritized   Carotid artery stenosis    S/p right CEA. Discussed importance of smoking cessation, BP control, statin, aspirin, plavix.      Essential hypertension    BP Readings from Last 3 Encounters:  06/29/15 155/83  06/22/15 149/75  06/19/15  164/55   BP well controlled generally post CVA. Will continue Amlodipine.       Hemiparesis, aphasia, and dysphagia as late effect of cerebrovascular accident (CVA) ([place]) - Primary    Symptoms improving. Has PT/OT at home. Will continue to monitor. Follow up with neurology as scheduled and here in 8 weeks.      Hyperlipidemia    Lab Results  Component Value Date   LDLCALC 136* 05/22/2015   Started on high dose Atorvastatin while in patient. Will recheck lipids and LFTs at follow up in 2 months.      Left-sided cerebrovascular accident (CVA) [facility])    Reviewed notes from recent hospitalization for CVA including imaging. Discussed importance of statin, aspirin, plavix, BP control and smoking cessation. Follow up with neurology next month. Follow up here in 8 weeks.      Tobacco abuse    Congratulated pt on smoking cessation. Will taper Nicoderm to '14mg'$ /24hr patch.      Relevant Medications   nicotine (NICODERM CQ) 14 mg/24hr patch       Return in  about 8 weeks (around 08/24/2015) for Recheck.  Ronette Deter, MD Internal Medicine [facility]

## 2015-06-29 NOTE — Assessment & Plan Note (Signed)
BP Readings from Last 3 Encounters:  06/29/15 155/83  06/22/15 149/75  06/19/15 164/55   BP well controlled generally post CVA. Will continue Amlodipine.

## 2015-06-29 NOTE — Assessment & Plan Note (Signed)
S/p right CEA. Discussed importance of smoking cessation, BP control, statin, aspirin, plavix.

## 2015-06-29 NOTE — Patient Instructions (Signed)
Follow up with neurology as scheduled.  Follow up here in 8 weeks.

## 2015-06-29 NOTE — Assessment & Plan Note (Signed)
Symptoms improving. Has PT/OT at home. Will continue to monitor. Follow up with neurology as scheduled and here in 8 weeks.

## 2015-06-29 NOTE — Assessment & Plan Note (Signed)
Reviewed notes from recent hospitalization for CVA including imaging. Discussed importance of statin, aspirin, plavix, BP control and smoking cessation. Follow up with neurology next month. Follow up here in 8 weeks.

## 2015-06-29 NOTE — Assessment & Plan Note (Signed)
Lab Results  Component Value Date   LDLCALC 136* 05/22/2015   Started on high dose Atorvastatin while in patient. Will recheck lipids and LFTs at follow up in 2 months.

## 2015-06-29 NOTE — Assessment & Plan Note (Signed)
Congratulated pt on smoking cessation. Will taper Nicoderm to '14mg'$ /24hr patch.

## 2015-06-30 ENCOUNTER — Encounter: Payer: Self-pay | Admitting: Vascular Surgery

## 2015-07-01 ENCOUNTER — Encounter: Payer: Self-pay | Admitting: Internal Medicine

## 2015-07-06 ENCOUNTER — Ambulatory Visit (INDEPENDENT_AMBULATORY_CARE_PROVIDER_SITE_OTHER): Payer: Medicare Other | Admitting: Vascular Surgery

## 2015-07-06 ENCOUNTER — Encounter: Payer: Self-pay | Admitting: Vascular Surgery

## 2015-07-06 VITALS — BP 115/85 | HR 79 | Ht 66.75 in | Wt 223.0 lb

## 2015-07-06 DIAGNOSIS — Z9889 Other specified postprocedural states: Secondary | ICD-10-CM

## 2015-07-06 NOTE — Progress Notes (Signed)
   Patient name: Jonathan Bowen MRN: [id number] DOB: 1945-10-25 Sex: male  REASON FOR VISIT: Follow-up right carotid endarterectomy on 06/18/2015.  HPI: Jonathan Bowen is a 70 y.o. male who had a left brain stroke with occlusion of his left internal carotid artery siphon. During his workup he was found to have a high-grade stenosis of his carotid bifurcation bilaterally. He made full recovery from his left brain stroke was recommended that he undergo right endarterectomy for reduction of stroke risk. He underwent an uneventful surgery on 06/18/2015 was discharged on postop day 1. His grandson is with him today. He reports that he does have some nervousness and anxiety that was present before his stroke and may be somewhat more now following his hospitalizations.  Current Outpatient Prescriptions  Medication Sig Dispense Refill  . amLODipine (NORVASC) 5 MG tablet Take 1 tablet (5 mg total) by mouth daily. 30 tablet 0  . aspirin EC 81 MG tablet Take 81 mg by mouth daily.    Marland Kitchen atorvastatin (LIPITOR) 80 MG tablet Take 1 tablet (80 mg total) by mouth daily at 6 PM. 30 tablet 0  . clopidogrel (PLAVIX) 75 MG tablet Take 1 tablet (75 mg total) by mouth daily. 30 tablet 2  . nicotine (NICODERM CQ) 14 mg/24hr patch Place 1 patch (14 mg total) onto the skin daily. 28 patch 0   No current facility-administered medications for this visit.       PHYSICAL EXAM: Filed Vitals:   07/06/15 0929 07/06/15 0932  BP: 161/82 115/85  Pulse: 79   Height: 5' 6.75" (1.695 m)   Weight: 223 lb (101.152 kg)   SpO2: 96%     GENERAL: The patient is a well-nourished male, in no acute distress. The vital signs are documented above. Carotid incision is well-healed with a normal healing ridge. Neurologically he is grossly intact  MEDICAL ISSUES: Able status post endarterectomy right carotid for asymptomatic high-grade stenosis. Contralateral internal carotid artery occlusion at the carotid siphon. He will continue his  usual activities. We'll see him again in 6 months with repeat carotid duplex His grandson is asking about potential medication regarding his anxiety and restlessness. Will defer to his PCP  Zurii Hewes [facility] [employer] [facility]: [phone]

## 2015-07-06 NOTE — Addendum Note (Signed)
Addended by: Reola Calkins on: 07/06/2015 03:20 PM   Modules accepted: Orders

## 2015-07-13 ENCOUNTER — Encounter: Payer: Self-pay | Admitting: Internal Medicine

## 2015-07-15 ENCOUNTER — Encounter: Payer: Self-pay | Admitting: Neurology

## 2015-07-16 ENCOUNTER — Other Ambulatory Visit: Payer: Self-pay | Admitting: Physician Assistant

## 2015-07-19 ENCOUNTER — Ambulatory Visit: Payer: Self-pay | Admitting: Internal Medicine

## 2015-07-19 ENCOUNTER — Telehealth: Payer: Self-pay | Admitting: Internal Medicine

## 2015-07-19 NOTE — Telephone Encounter (Signed)
FYI, Pt daughter Addy called stating that pt is refusing to come in today for his appt. Appt is still on the sch. Let me know if you want me to cancel. Thank you!

## 2015-07-20 ENCOUNTER — Other Ambulatory Visit: Payer: Self-pay | Admitting: Physician Assistant

## 2015-07-20 ENCOUNTER — Other Ambulatory Visit: Payer: Self-pay | Admitting: *Deleted

## 2015-07-20 ENCOUNTER — Encounter: Payer: Self-pay | Admitting: Internal Medicine

## 2015-07-20 MED ORDER — NICOTINE 14 MG/24HR TD PT24: 14.0000 mg | MEDICATED_PATCH | Freq: Every day | TRANSDERMAL | Status: DC

## 2015-07-20 MED ORDER — ATORVASTATIN CALCIUM 80 MG PO TABS: 80.0000 mg | ORAL_TABLET | Freq: Every day | ORAL | Status: DC

## 2015-07-20 MED ORDER — CLOPIDOGREL BISULFATE 75 MG PO TABS: 75.0000 mg | ORAL_TABLET | Freq: Every day | ORAL | Status: DC

## 2015-07-20 MED ORDER — AMLODIPINE BESYLATE 5 MG PO TABS: 5.0000 mg | ORAL_TABLET | Freq: Every day | ORAL | Status: DC

## 2015-07-20 NOTE — Telephone Encounter (Signed)
Medications are historical... Okay to refill?

## 2015-08-09 ENCOUNTER — Ambulatory Visit: Payer: Medicare Other | Admitting: Neurology

## 2015-08-24 ENCOUNTER — Ambulatory Visit: Payer: Medicare Other | Admitting: Internal Medicine

## 2015-09-03 ENCOUNTER — Encounter: Payer: Self-pay | Admitting: Internal Medicine

## 2015-09-03 ENCOUNTER — Ambulatory Visit (INDEPENDENT_AMBULATORY_CARE_PROVIDER_SITE_OTHER): Payer: Medicare Other | Admitting: Internal Medicine

## 2015-09-03 VITALS — BP 140/68 | HR 86 | Ht 67.0 in | Wt 225.8 lb

## 2015-09-03 DIAGNOSIS — E785 Hyperlipidemia, unspecified: Secondary | ICD-10-CM

## 2015-09-03 DIAGNOSIS — I10 Essential (primary) hypertension: Secondary | ICD-10-CM | POA: Diagnosis not present

## 2015-09-03 DIAGNOSIS — I639 Cerebral infarction, unspecified: Secondary | ICD-10-CM | POA: Diagnosis not present

## 2015-09-03 DIAGNOSIS — M25559 Pain in unspecified hip: Secondary | ICD-10-CM | POA: Diagnosis not present

## 2015-09-03 NOTE — Progress Notes (Signed)
Subjective:    Patient ID: Jonathan Bowen, male    DOB: [birth year], 70 y.o.   MRN: [id number]  HPI  70YO male presents for follow up.  Feeling well. No recent falls. Compliant with medications.  Bilateral hip pain - Aching pain with walking. Improves with rest. Ongoing for years. Not taking any medication for this.   Wt Readings from Last 3 Encounters:  09/03/15 225 lb 12.8 oz (102.422 kg)  07/06/15 223 lb (101.152 kg)  06/29/15 225 lb (102.059 kg)   BP Readings from Last 3 Encounters:  09/03/15 140/68  07/06/15 115/85  06/29/15 155/83    Past Medical History  Diagnosis Date  . Smoker   . Hyperlipidemia   . Hypertension   . Complication of anesthesia     has never had surgery  . Stroke [facility])     just happened in early February   Family History  Problem Relation Age of Onset  . Diabetes Mother   . Cancer Father     Unknown of the type of cancer   . Diabetes Sister   . ALS Brother    Past Surgical History  Procedure Laterality Date  . Endarterectomy Right 06/18/2015    Procedure: ENDARTERECTOMY RIGHT CAROTID;  Surgeon: Rosetta Posner, MD;  Location: [place];  Service: Vascular;  Laterality: Right;  . Patch angioplasty Right 06/18/2015    Procedure: PATCH ANGIOPLASTY RIGHT CAROTID ARTERY;  Surgeon: Rosetta Posner, MD;  Location: [facility];  Service: Vascular;  Laterality: Right;   Social History   Social History  . Marital Status: Widowed    Spouse Name: N/A  . Number of Children: N/A  . Years of Education: N/A   Social History Main Topics  . Smoking status: Former Smoker -- 3.00 packs/day for 11 years    Quit date: 05/28/2015  . Smokeless tobacco: None  . Alcohol Use: No  . Drug Use: No  . Sexual Activity: Not Currently   Other Topics Concern  . None   Social History Narrative   Lives with son in [place]. Single, divorced. No pets in home.      Work - Retired Dealer.      Diet - regular      Exercise - limited.    Review of Systems  Constitutional:  Negative for fever, chills, activity change, appetite change, fatigue and unexpected weight change.  Eyes: Negative for visual disturbance.  Respiratory: Negative for cough and shortness of breath.   Cardiovascular: Negative for chest pain, palpitations and leg swelling.  Gastrointestinal: Negative for nausea, vomiting, abdominal pain, diarrhea, constipation and abdominal distention.  Genitourinary: Negative for dysuria, urgency and difficulty urinating.  Musculoskeletal: Negative for arthralgias and gait problem.  Skin: Negative for color change and rash.  Hematological: Negative for adenopathy. Does not bruise/bleed easily.  Psychiatric/Behavioral: Negative for suicidal ideas, sleep disturbance and dysphoric mood. The patient is not nervous/anxious.        Objective:    BP 140/68 mmHg  Pulse 86  Ht '5\' 7"'$  (1.702 m)  Wt 225 lb 12.8 oz (102.422 kg)  BMI 35.36 kg/m2  SpO2 96% Physical Exam  Constitutional: He is oriented to person, place, and time. He appears well-developed and well-nourished. No distress.  HENT:  Head: Normocephalic and atraumatic.  Right Ear: External ear normal.  Left Ear: External ear normal.  Nose: Nose normal.  Mouth/Throat: Oropharynx is clear and moist. No oropharyngeal exudate.  Eyes: Conjunctivae and EOM are normal. Pupils are equal, round, and  reactive to light. Right eye exhibits no discharge. Left eye exhibits no discharge. No scleral icterus.  Neck: Normal range of motion. Neck supple. No tracheal deviation present. No thyromegaly present.  Cardiovascular: Normal rate, regular rhythm and normal heart sounds.  Exam reveals no gallop and no friction rub.   No murmur heard. Pulmonary/Chest: Effort normal and breath sounds normal. No accessory muscle usage. No tachypnea. No respiratory distress. He has no decreased breath sounds. He has no wheezes. He has no rhonchi. He has no rales. He exhibits no tenderness.  Musculoskeletal: Normal range of motion. He  exhibits no edema.  Lymphadenopathy:    He has no cervical adenopathy.  Neurological: He is alert and oriented to person, place, and time. No cranial nerve deficit. Coordination normal.  Skin: Skin is warm and dry. No rash noted. He is not diaphoretic. No erythema. No pallor.  Psychiatric: He has a normal mood and affect. His behavior is normal. Judgment and thought content normal.          Assessment & Plan:   Problem List Items Addressed This Visit      Unprioritized   Essential hypertension    BP Readings from Last 3 Encounters:  09/03/15 140/68  07/06/15 115/85  06/29/15 155/83   BP well controlled. Will check renal function next visit.      Hip pain - Primary    New problem. Bilateral hip pain, likely OA. Will try prn Tylenol '1000mg'$  up to three times daily. Follow up prn if symptoms are not improving.      Hyperlipidemia    Will plan to check lipids with labs next visit. Continue Atorvastatin.          Return in about 3 months (around 12/04/2015) for Recheck.  Ronette Deter, MD [facility]

## 2015-09-03 NOTE — Assessment & Plan Note (Signed)
Will plan to check lipids with labs next visit. Continue Atorvastatin.

## 2015-09-03 NOTE — Patient Instructions (Signed)
Start Tylenol '1000mg'$  up to three times daily for hip pain.  Follow up in 3 months and sooner as needed.

## 2015-09-03 NOTE — Assessment & Plan Note (Signed)
New problem. Bilateral hip pain, likely OA. Will try prn Tylenol '1000mg'$  up to three times daily. Follow up prn if symptoms are not improving.

## 2015-09-03 NOTE — Progress Notes (Signed)
Pre visit review using our clinic review tool, if applicable. No additional management support is needed unless otherwise documented below in the visit note. 

## 2015-09-03 NOTE — Assessment & Plan Note (Signed)
BP Readings from Last 3 Encounters:  09/03/15 140/68  07/06/15 115/85  06/29/15 155/83   BP well controlled. Will check renal function next visit.

## 2015-09-24 ENCOUNTER — Other Ambulatory Visit: Payer: Self-pay | Admitting: *Deleted

## 2015-09-24 ENCOUNTER — Other Ambulatory Visit: Payer: Self-pay | Admitting: Internal Medicine

## 2015-09-24 MED ORDER — ATORVASTATIN CALCIUM 80 MG PO TABS: 80.0000 mg | ORAL_TABLET | Freq: Every day | ORAL | Status: DC

## 2015-09-24 MED ORDER — AMLODIPINE BESYLATE 5 MG PO TABS: 5.0000 mg | ORAL_TABLET | Freq: Every day | ORAL | Status: DC

## 2015-09-24 MED ORDER — CLOPIDOGREL BISULFATE 75 MG PO TABS: 75.0000 mg | ORAL_TABLET | Freq: Every day | ORAL | Status: DC

## 2015-12-08 ENCOUNTER — Ambulatory Visit (INDEPENDENT_AMBULATORY_CARE_PROVIDER_SITE_OTHER): Payer: Medicare Other | Admitting: Family Medicine

## 2015-12-08 ENCOUNTER — Ambulatory Visit: Payer: Medicare Other | Admitting: Internal Medicine

## 2015-12-08 ENCOUNTER — Encounter: Payer: Self-pay | Admitting: Family Medicine

## 2015-12-08 VITALS — BP 156/86 | HR 64 | Temp 97.6°F | Wt 219.2 lb

## 2015-12-08 DIAGNOSIS — I10 Essential (primary) hypertension: Secondary | ICD-10-CM | POA: Diagnosis not present

## 2015-12-08 DIAGNOSIS — E785 Hyperlipidemia, unspecified: Secondary | ICD-10-CM | POA: Diagnosis not present

## 2015-12-08 DIAGNOSIS — I6521 Occlusion and stenosis of right carotid artery: Secondary | ICD-10-CM

## 2015-12-08 DIAGNOSIS — I639 Cerebral infarction, unspecified: Secondary | ICD-10-CM | POA: Diagnosis not present

## 2015-12-08 DIAGNOSIS — D649 Anemia, unspecified: Secondary | ICD-10-CM | POA: Insufficient documentation

## 2015-12-08 LAB — LIPID PANEL
CHOL/HDL RATIO: 4
Cholesterol: 132 mg/dL (ref 0–200)
HDL: 34.5 mg/dL — AB (ref >39.00)
LDL CALC: 83 mg/dL (ref 0–99)
NONHDL: 97.56
TRIGLYCERIDES: 71 mg/dL (ref 0.0–149.0)
VLDL: 14.2 mg/dL (ref 0.0–40.0)

## 2015-12-08 LAB — CBC
HCT: 41.2 % (ref 39.0–52.0)
HEMOGLOBIN: 13.7 g/dL (ref 13.0–17.0)
MCHC: 33.3 g/dL (ref 30.0–36.0)
MCV: 87.6 fl (ref 78.0–100.0)
PLATELETS: 222 10*3/uL (ref 150.0–400.0)
RBC: 4.7 Mil/uL (ref 4.22–5.81)
RDW: 13.6 % (ref 11.5–15.5)
WBC: 7.3 10*3/uL (ref 4.0–10.5)

## 2015-12-08 MED ORDER — AMLODIPINE BESYLATE 10 MG PO TABS: 10.0000 mg | ORAL_TABLET | Freq: Every day | ORAL | 3 refills | Status: DC

## 2015-12-08 NOTE — Assessment & Plan Note (Addendum)
Patient has recovered well following stroke. He is currently compliant with aspirin, statin, Plavix. He has quit smoking.

## 2015-12-08 NOTE — Assessment & Plan Note (Signed)
Established problem, uncontrolled. Lipid panel today. Continue Lipitor 80 mg daily.

## 2015-12-08 NOTE — Progress Notes (Signed)
Subjective:  Patient ID: Jonathan Bowen, male    DOB: [birth year]  Age: 70 y.o. MRN: [id number]  CC: Follow up  HPI:  70 year old male with a history of stroke, carotid artery stenosis status post right carotid endarterectomy, hypertension, hyperlipidemia, tobacco abuse presents for follow-up.  HTN  Not at goal (see vitals below).  Currently on Norvasc 5 mg daily.  HLD  Uncontrolled/not at goal.  LDL in February was 136.  Is on Lipitor 80 mg daily.  We'll need to check lipids today.  Hx of Stroke, Carotid artery disease  Stroke deficits have essentially resolved per his report.  He is taking aspirin, Plavix, statin.  He has quit smoking.  Social Hx   Social History   Social History  . Marital status: Widowed    Spouse name: N/A  . Number of children: N/A  . Years of education: N/A   Social History Main Topics  . Smoking status: Former Smoker    Packs/day: 3.00    Years: 11.00    Quit date: 05/28/2015  . Smokeless tobacco: None  . Alcohol use No  . Drug use: No  . Sexual activity: Not Currently   Other Topics Concern  . None   Social History Narrative   Lives with son in [place]. Single, divorced. No pets in home.      Work - Retired Dealer.      Diet - regular      Exercise - limited.   Review of Systems  Constitutional: Negative.   Neurological: Negative.    Objective:  BP (!) 156/86 (BP Location: Right Arm, Patient Position: Sitting, Cuff Size: Large)   Pulse 64   Temp 97.6 F (36.4 C) (Oral)   Wt 219 lb 4 oz (99.5 kg)   SpO2 98%   BMI 34.34 kg/m   BP/Weight 12/08/2015 09/03/2015 0/73/7106  Systolic BP 269 485 462  Diastolic BP 86 68 85  Wt. (Lbs) 219.25 225.8 223  BMI 34.34 35.36 35.21   Physical Exam  Constitutional: He appears well-developed. No distress.  Cardiovascular: Normal rate and regular rhythm.   Pulmonary/Chest: Effort normal. He has no wheezes. He has no rales.  Neurological: He is alert.  Seems to have some  difficulties with cognition/memory.  Psychiatric: He has a normal mood and affect.  Vitals reviewed.  Lab Results  Component Value Date   WBC 9.4 06/19/2015   HGB 12.1 (L) 06/19/2015   HCT 38.7 (L) 06/19/2015   PLT 178 06/19/2015   GLUCOSE 98 06/19/2015   CHOL 191 05/22/2015   TRIG 143 05/22/2015   HDL 26 (L) 05/22/2015   LDLCALC 136 (H) 05/22/2015   ALT 18 06/15/2015   AST 16 06/15/2015   NA 141 06/19/2015   K 4.0 06/19/2015   CL 108 06/19/2015   CREATININE 0.85 06/19/2015   BUN 14 06/19/2015   CO2 25 06/19/2015   INR 1.01 06/15/2015   HGBA1C 5.8 (H) 05/22/2015    Assessment & Plan:   Problem List Items Addressed This Visit    Hyperlipidemia    Established problem, uncontrolled. Lipid panel today. Continue Lipitor 80 mg daily.      Relevant Medications   amLODipine (NORVASC) 10 MG tablet   Other Relevant Orders   Lipid Profile   Essential hypertension - Primary    Established problem, uncontrolled. Increasing Norvasc to 10 mg daily.      Relevant Medications   amLODipine (NORVASC) 10 MG tablet   Carotid artery stenosis  Stable. Compliant with aspirin, statin, Plavix. Has quit smoking.      Relevant Medications   amLODipine (NORVASC) 10 MG tablet   Left-sided cerebrovascular accident (CVA) [facility])    Patient has recovered well following stroke. He is currently compliant with aspirin, statin, Plavix. He has quit smoking.      Relevant Medications   amLODipine (NORVASC) 10 MG tablet   Anemia    New problem. Noted on most recent blood work. CBC today.      Relevant Orders   CBC    Other Visit Diagnoses   None.    Meds ordered this encounter  Medications  . amLODipine (NORVASC) 10 MG tablet    Sig: Take 1 tablet (10 mg total) by mouth daily.    Dispense:  90 tablet    Refill:  3    Follow-up: Return in about 2 weeks (around 12/22/2015) for BP check - Nurse visit.  [place]

## 2015-12-08 NOTE — Patient Instructions (Signed)
I have increase your Amlodipine to 10 mg daily.  Continue your other medications.  Follow up in 2 weeks for a BP check.  Take care  Dr. Lacinda Axon

## 2015-12-08 NOTE — Assessment & Plan Note (Signed)
Stable. Compliant with aspirin, statin, Plavix. Has quit smoking.

## 2015-12-08 NOTE — Assessment & Plan Note (Signed)
New problem. Noted on most recent blood work. CBC today.

## 2015-12-08 NOTE — Assessment & Plan Note (Signed)
Established problem, uncontrolled. Increasing Norvasc to 10 mg daily.

## 2015-12-22 ENCOUNTER — Telehealth: Payer: Self-pay | Admitting: *Deleted

## 2015-12-22 ENCOUNTER — Ambulatory Visit (INDEPENDENT_AMBULATORY_CARE_PROVIDER_SITE_OTHER): Payer: Medicare Other

## 2015-12-22 VITALS — BP 132/72 | HR 63 | Resp 18

## 2015-12-22 DIAGNOSIS — I10 Essential (primary) hypertension: Secondary | ICD-10-CM | POA: Diagnosis not present

## 2015-12-22 NOTE — Progress Notes (Signed)
BP well controlled. Continue current treatment.   [place]

## 2015-12-22 NOTE — Progress Notes (Signed)
Patient came in for a BP Check per last note from PCP, Dr. Lacinda Axon.  Checked BP in Right upper extremity.  See vitals for details.    Please advise.

## 2015-12-22 NOTE — Telephone Encounter (Signed)
void

## 2016-01-04 ENCOUNTER — Encounter: Payer: Self-pay | Admitting: Vascular Surgery

## 2016-01-11 ENCOUNTER — Ambulatory Visit (HOSPITAL_COMMUNITY): Payer: Medicare Other | Attending: Vascular Surgery

## 2016-01-11 ENCOUNTER — Ambulatory Visit: Payer: Medicare Other | Admitting: Vascular Surgery

## 2016-03-14 ENCOUNTER — Telehealth: Payer: Self-pay

## 2016-03-14 ENCOUNTER — Other Ambulatory Visit: Payer: Self-pay | Admitting: Family Medicine

## 2016-03-14 DIAGNOSIS — E785 Hyperlipidemia, unspecified: Secondary | ICD-10-CM

## 2016-03-14 NOTE — Telephone Encounter (Signed)
Pt coming for repeat labs. Looks like maybe re-check of lipids. Please place future order. Thank you.

## 2016-03-15 ENCOUNTER — Other Ambulatory Visit (INDEPENDENT_AMBULATORY_CARE_PROVIDER_SITE_OTHER): Payer: Medicare Other

## 2016-03-15 DIAGNOSIS — E785 Hyperlipidemia, unspecified: Secondary | ICD-10-CM

## 2016-03-15 DIAGNOSIS — Z23 Encounter for immunization: Secondary | ICD-10-CM | POA: Diagnosis not present

## 2016-03-15 LAB — LIPID PANEL
CHOL/HDL RATIO: 4
Cholesterol: 114 mg/dL (ref 0–200)
HDL: 27.2 mg/dL — ABNORMAL LOW (ref >39.00)
LDL CALC: 73 mg/dL (ref 0–99)
NONHDL: 86.72
TRIGLYCERIDES: 71 mg/dL (ref 0.0–149.0)
VLDL: 14.2 mg/dL (ref 0.0–40.0)

## 2016-04-07 ENCOUNTER — Other Ambulatory Visit: Payer: Self-pay | Admitting: Family Medicine

## 2016-04-07 MED ORDER — CLOPIDOGREL BISULFATE 75 MG PO TABS: 75.0000 mg | ORAL_TABLET | Freq: Every day | ORAL | 1 refills | Status: DC

## 2016-04-07 MED ORDER — ATORVASTATIN CALCIUM 80 MG PO TABS: 80.0000 mg | ORAL_TABLET | Freq: Every day | ORAL | 3 refills | Status: AC

## 2016-04-07 MED ORDER — AMLODIPINE BESYLATE 10 MG PO TABS: 10.0000 mg | ORAL_TABLET | Freq: Every day | ORAL | 3 refills | Status: AC

## 2016-04-07 NOTE — Telephone Encounter (Signed)
Refilled 09/24/15. Pt last seen 12/08/15. Please advise?

## 2016-04-17 DIAGNOSIS — F0390 Unspecified dementia without behavioral disturbance: Secondary | ICD-10-CM

## 2016-04-17 HISTORY — DX: Unspecified dementia, unspecified severity, without behavioral disturbance, psychotic disturbance, mood disturbance, and anxiety: F03.90

## 2016-04-18 ENCOUNTER — Ambulatory Visit (INDEPENDENT_AMBULATORY_CARE_PROVIDER_SITE_OTHER): Payer: Medicare Other

## 2016-04-18 VITALS — BP 142/70 | HR 61 | Temp 98.0°F | Resp 14 | Ht 67.0 in | Wt 219.8 lb

## 2016-04-18 DIAGNOSIS — Z0000 Encounter for general adult medical examination without abnormal findings: Secondary | ICD-10-CM

## 2016-04-18 NOTE — Patient Instructions (Addendum)
[name] , Thank you for taking time to come for your Medicare Wellness Visit. I appreciate your ongoing commitment to your health goals. Please review the following plan we discussed and let me know if I can assist you in the future.   Follow up with Dr. Lacinda Axon as needed.  These are the goals we discussed: Goals    . Increase physical activity          Stay active and walk for exercise    . Increase water intake       This is a list of the screening recommended for you and due dates:  Health Maintenance  Topic Date Due  .  Hepatitis C: One time screening is recommended by Center for Disease Control  (CDC) for  adults born from [birth year] through 1966.   [birth year]  . Tetanus Vaccine  07/22/1963  . Colon Cancer Screening  07/22/1994  . Shingles Vaccine  07/21/2004  . Pneumonia vaccines (2 of 2 - PCV13) 01/29/2016  . Flu Shot  Addressed    Hearing Loss Introduction Hearing loss is a partial or total loss of the ability to hear. This can be temporary or permanent, and it can happen in one or both ears. Hearing loss may be referred to as deafness. Medical care is necessary to treat hearing loss properly and to prevent the condition from getting worse. Your hearing may partially or completely come back, depending on what caused your hearing loss and how severe it is. In some cases, hearing loss is permanent. What are the causes? Common causes of hearing loss include:  Too much wax in the ear canal.  Infection of the ear canal or middle ear.  Fluid in the middle ear.  Injury to the ear or surrounding area.  An object stuck in the ear.  Prolonged exposure to loud sounds, such as music. Less common causes of hearing loss include:  Tumors in the ear.  Viral or bacterial infections, such as meningitis.  A hole in the eardrum (perforated eardrum).  Problems with the hearing nerve that sends signals between the brain and the ear.  Certain medicines. What are the signs or  symptoms? Symptoms of this condition may include:  Difficulty telling the difference between sounds.  Difficulty following a conversation when there is background noise.  Lack of response to sounds in your environment. This may be most noticeable when you do not respond to startling sounds.  Needing to turn up the volume on the television, radio, etc.  Ringing in the ears.  Dizziness.  Pain in the ears. How is this diagnosed? This condition is diagnosed based on a physical exam and a hearing test (audiometry). The audiometry test will be performed by a hearing specialist (audiologist). You may also be referred to an ear, nose, and throat (ENT) specialist (otolaryngologist). How is this treated? Treatment for recent onset of hearing loss may include:  Ear wax removal.  Being prescribed medicines to prevent infection (antibiotics).  Being prescribed medicines to reduce inflammation (corticosteroids). Follow these instructions at home:  If you were prescribed an antibiotic medicine, take it as told by your health care provider. Do not stop taking the antibiotic even if you start to feel better.  Take over-the-counter and prescription medicines only as told by your health care provider.  Avoid loud noises.  Return to your normal activities as told by your health care provider. Ask your health care provider what activities are safe for you.  Keep all follow-up  visits as told by your health care provider. This is important. Contact a health care provider if:  You feel dizzy.  You develop new symptoms.  You vomit or feel nauseous.  You have a fever. Get help right away if:  You develop sudden changes in your vision.  You have severe ear pain.  You have new or increased weakness.  You have a severe headache. This information is not intended to replace advice given to you by your health care provider. Make sure you discuss any questions you have with your health care  provider. Document Released: 04/03/2005 Document Revised: 09/09/2015 Document Reviewed: 08/19/2014  2017 [employer]

## 2016-04-18 NOTE — Progress Notes (Signed)
Care was provided under my supervision. I agree with the management as indicated in the note.  Nicolaus Andel DO  

## 2016-04-18 NOTE — Progress Notes (Signed)
Subjective:   Jonathan Bowen is a 71 y.o. male who presents for an Initial Medicare Annual Wellness Visit.  Review of Systems  No ROS.  Medicare Wellness Visit.  Cardiac Risk Factors include: advanced age (>66mn, >>6women);hypertension;male gender    Objective:    Today's Vitals   04/18/16 0836  BP: (!) 142/70  Pulse: 61  Resp: 14  Temp: 98 F (36.7 C)  TempSrc: Oral  SpO2: 97%  Weight: 219 lb 12.8 oz (99.7 kg)  Height: '5\' 7"'$  (1.702 m)   Body mass index is 34.43 kg/m.  Current Medications (verified) Outpatient Encounter Prescriptions as of 04/18/2016  Medication Sig  . amLODipine (NORVASC) 10 MG tablet Take 1 tablet (10 mg total) by mouth daily.  .Marland Kitchenaspirin EC 81 MG tablet Take 81 mg by mouth daily.  .Marland Kitchenatorvastatin (LIPITOR) 80 MG tablet Take 1 tablet (80 mg total) by mouth daily at 6 PM.  . clopidogrel (PLAVIX) 75 MG tablet Take 1 tablet (75 mg total) by mouth daily.  . [DISCONTINUED] nicotine (NICODERM CQ) 14 mg/24hr patch Place 1 patch (14 mg total) onto the skin daily.   No facility-administered encounter medications on file as of 04/18/2016.     Allergies (verified) Patient has no known allergies.   History: Past Medical History:  Diagnosis Date  . Complication of anesthesia    has never had surgery  . Hyperlipidemia   . Hypertension   . Smoker   . Stroke ([facility]    just happened in early February   Past Surgical History:  Procedure Laterality Date  . ENDARTERECTOMY Right 06/18/2015   Procedure: ENDARTERECTOMY RIGHT CAROTID;  Surgeon: TRosetta Posner MD;  [facility]  Service: Vascular;  Laterality: Right;  . PATCH ANGIOPLASTY Right 06/18/2015   Procedure: PATCH ANGIOPLASTY RIGHT CAROTID ARTERY;  Surgeon: TRosetta Posner MD;  [facility]  Service: Vascular;  Laterality: Right;   Family History  Problem Relation Age of Onset  . Diabetes Mother   . Cancer Father     Unknown of the type of cancer   . Diabetes Sister   . ALS Brother    Social History    Occupational History  . Not on file.   Social History Main Topics  . Smoking status: Former Smoker    Packs/day: 3.00    Years: 11.00    Quit date: 05/28/2015  . Smokeless tobacco: Not on file  . Alcohol use No  . Drug use: No  . Sexual activity: Not Currently   Tobacco Counseling Counseling given: Not Answered   Activities of Daily Living In your present state of health, do you have any difficulty performing the following activities: 04/18/2016 06/18/2015  Hearing? N Y  Vision? N N  Difficulty concentrating or making decisions? N N  Walking or climbing stairs? N N  Dressing or bathing? N N  Doing errands, shopping? N N  Preparing Food and eating ? N -  Using the Toilet? N -  In the past six months, have you accidently leaked urine? N -  Do you have problems with loss of bowel control? N -  Managing your Medications? N -  Managing your Finances? N -  Housekeeping or managing your Housekeeping? N -    Immunizations and Health Maintenance Immunization History  Administered Date(s) Administered  . Influenza-Unspecified 01/29/2015, 01/25/2016  . Pneumococcal-Unspecified 01/29/2015   Health Maintenance Due  Topic Date Due  . Hepatitis C Screening  014-Mar-1947 . TETANUS/TDAP  07/22/1963  . COLONOSCOPY  07/22/1994  . ZOSTAVAX  07/21/2004  . PNA vac Low Risk Adult (2 of 2 - PCV13) 01/29/2016    Patient Care Team: Coral Spikes, DO as PCP - General (Family Medicine)  Indicate any recent Medical Services you may have received from other than [facility] providers in the past year (date may be approximate).    Assessment:   This is a routine wellness examination for [name].  The goal of the wellness visit is to assist the patient how to close the gaps in care and create a preventative care plan for the patient.   Osteoporosis risk reviewed.  Medications reviewed; taking without issues or barriers.  Safety issues reviewed; lives with son.  Smoke detectors in the home.  Firearms locked in a safe within the home. Wears seatbelts when driving or riding with others. No violence in the home.  No identified risk were noted; The patient was oriented x 3; appropriate in dress and manner and no objective failures at ADL's or IADL's.   BMI; discussed the importance of a healthy diet, water intake and exercise. Educational material provided.  HTN; 142/70.  Medication not taken this morning.  Followed by PCP.  Prevnar 13 vaccine deferred; he will verify he has not already had this vaccine at his pharmacy this year.  Patient Concerns: None at this time. Follow up with PCP as needed.  Hearing/Vision screen Hearing Screening Comments: Difficulty hearing a whisper. Audiologic testing deferred per patient preference. Vision Screening Comments: Followed by [facility] Wears glasses Visual acuity was not assessed per patient preference since he has regular follow up with her ophthalmologist.  Dietary issues and exercise activities discussed: Current Exercise Habits: Home exercise routine (Active at home; yard work, cuts wood), Time (Minutes): 20  Goals    . Increase physical activity          Stay active and walk for exercise    . Increase water intake      Depression Screen PHQ 2/9 Scores 04/18/2016  PHQ - 2 Score 0    Fall Risk Fall Risk  04/18/2016  Falls in the past year? No    Cognitive Function:     6CIT Screen 04/18/2016  What Year? 0 points  What month? 0 points  What time? 0 points  Count back from 20 0 points  Months in reverse 0 points    Screening Tests Health Maintenance  Topic Date Due  . Hepatitis C Screening  01-07-1946  . TETANUS/TDAP  07/22/1963  . COLONOSCOPY  07/22/1994  . ZOSTAVAX  07/21/2004  . PNA vac Low Risk Adult (2 of 2 - PCV13) 01/29/2016  . INFLUENZA VACCINE  Addressed        Plan:    End of life planning; Advance aging; Advanced directives discussed. No HCPOA/Living Will.  Additional information  declined at this time.  Medicare Attestation I have personally reviewed: The patient's medical and social history Their use of alcohol, tobacco or illicit drugs Their current medications and supplements The patient's functional ability including ADLs,fall risks, home safety risks, cognitive, and hearing and visual impairment Diet and physical activities Evidence for depression   The patient's weight, height, BMI, and visual acuity have been recorded in the chart.  I have made referrals and provided education to the patient based on review of the above and I have provided the patient with a written personalized care plan for preventive services.    During the course of the visit  [place] was educated and counseled about the following appropriate screening and preventive services:   Vaccines to include Pneumoccal, Influenza, Hepatitis B, Td, Zostavax, HCV  Electrocardiogram  Colorectal cancer screening  Cardiovascular disease screening  Diabetes screening  Glaucoma screening  Nutrition counseling  Prostate cancer screening  Smoking cessation counseling  Patient Instructions (the written plan) were given to the patient.   Varney Biles, LPN   08/23/9232

## 2016-07-04 ENCOUNTER — Other Ambulatory Visit: Payer: Self-pay | Admitting: Family Medicine

## 2016-07-04 ENCOUNTER — Telehealth: Payer: Self-pay | Admitting: Family Medicine

## 2016-07-04 NOTE — Telephone Encounter (Signed)
Unable to reach pt. If he calls back pt should be able to get refills on medication due to them being sent in for a year. If he would like a follow up on cholesterol and BP that is fine to schedule.

## 2016-07-04 NOTE — Telephone Encounter (Signed)
Pt came into office. He just had his last refills filled. Pt wants to know if he needs to make an appt before his next refill. Please call him at [phone]. If no answer pt will call back on Thursday.

## 2016-07-05 NOTE — Telephone Encounter (Signed)
Refilled: 04/07/16 Last OV: 12/08/15 Last Labs: 03/15/16 Future OV: none Please advise?

## 2016-10-03 ENCOUNTER — Telehealth: Payer: Self-pay | Admitting: Family Medicine

## 2016-10-03 NOTE — Telephone Encounter (Signed)
Does patient need to be seen earlier? Your thoughts are appreciated

## 2016-10-03 NOTE — Telephone Encounter (Signed)
Spoke with patient Daughter per DPR and she is going to discuss with family on when they would like to bring Jonathan Bowen for an appt. Will call tomorrow to notify us when they will be able to come in if okay with you

## 2016-10-03 NOTE — Telephone Encounter (Signed)
Pt sister called and stated that pt needs something for his nerves. She states that she is very jittering and cursing, and gaining weight. She sates that you cannot have a normal conversation with him without him losing it. Pt is scheduled for 7/3. Pt sister and family states that do not want the patient to know that this would be for his nerves. Please advise, thank you!  Call pt @ [phone]

## 2016-10-03 NOTE — Telephone Encounter (Signed)
Needs appt

## 2016-10-13 ENCOUNTER — Ambulatory Visit: Payer: Medicare Other | Admitting: Family Medicine

## 2016-10-17 ENCOUNTER — Ambulatory Visit (INDEPENDENT_AMBULATORY_CARE_PROVIDER_SITE_OTHER): Payer: Medicare Other | Admitting: Family Medicine

## 2016-10-17 ENCOUNTER — Encounter: Payer: Self-pay | Admitting: Family Medicine

## 2016-10-17 VITALS — BP 170/68 | HR 71 | Temp 98.2°F | Resp 12 | Wt 231.1 lb

## 2016-10-17 DIAGNOSIS — Z23 Encounter for immunization: Secondary | ICD-10-CM | POA: Diagnosis not present

## 2016-10-17 DIAGNOSIS — R739 Hyperglycemia, unspecified: Secondary | ICD-10-CM

## 2016-10-17 DIAGNOSIS — Z8673 Personal history of transient ischemic attack (TIA), and cerebral infarction without residual deficits: Secondary | ICD-10-CM | POA: Diagnosis not present

## 2016-10-17 DIAGNOSIS — I10 Essential (primary) hypertension: Secondary | ICD-10-CM | POA: Diagnosis not present

## 2016-10-17 DIAGNOSIS — E785 Hyperlipidemia, unspecified: Secondary | ICD-10-CM | POA: Diagnosis not present

## 2016-10-17 DIAGNOSIS — Z862 Personal history of diseases of the blood and blood-forming organs and certain disorders involving the immune mechanism: Secondary | ICD-10-CM

## 2016-10-17 LAB — LIPID PANEL
Cholesterol: 127 mg/dL (ref 0–200)
HDL: 37.5 mg/dL — ABNORMAL LOW (ref >39.00)
LDL CALC: 75 mg/dL (ref 0–99)
NONHDL: 89.63
Total CHOL/HDL Ratio: 3
Triglycerides: 72 mg/dL (ref 0.0–149.0)
VLDL: 14.4 mg/dL (ref 0.0–40.0)

## 2016-10-17 LAB — COMPREHENSIVE METABOLIC PANEL
ALT: 15 U/L (ref 0–53)
AST: 13 U/L (ref 0–37)
Albumin: 4 g/dL (ref 3.5–5.2)
Alkaline Phosphatase: 74 U/L (ref 39–117)
BUN: 20 mg/dL (ref 6–23)
CHLORIDE: 106 meq/L (ref 96–112)
CO2: 29 mEq/L (ref 19–32)
Calcium: 9.2 mg/dL (ref 8.4–10.5)
Creatinine, Ser: 1.11 mg/dL (ref 0.40–1.50)
GFR: 69.16 mL/min (ref >60.00)
GLUCOSE: 147 mg/dL — AB (ref 70–99)
POTASSIUM: 4.2 meq/L (ref 3.5–5.1)
SODIUM: 140 meq/L (ref 135–145)
Total Bilirubin: 0.5 mg/dL (ref 0.2–1.2)
Total Protein: 7 g/dL (ref 6.0–8.3)

## 2016-10-17 LAB — CBC
HEMATOCRIT: 40.1 % (ref 39.0–52.0)
Hemoglobin: 13.3 g/dL (ref 13.0–17.0)
MCHC: 33.2 g/dL (ref 30.0–36.0)
MCV: 87.8 fl (ref 78.0–100.0)
PLATELETS: 227 10*3/uL (ref 150.0–400.0)
RBC: 4.56 Mil/uL (ref 4.22–5.81)
RDW: 13.6 % (ref 11.5–15.5)
WBC: 7.3 10*3/uL (ref 4.0–10.5)

## 2016-10-17 LAB — HEMOGLOBIN A1C: HEMOGLOBIN A1C: 5.8 % (ref 4.6–6.5)

## 2016-10-17 NOTE — Progress Notes (Signed)
Subjective:  Patient ID: Jonathan Bowen, male    DOB: May 02, 1945  Age: 71 y.o. MRN: [id number]  CC: Follow up  HPI:  71 year old male with History of stroke, carotid artery stenosis status post endarterectomy, hypertension, hyperlipidemia presents for follow-up.  HTN  BP elevated today.  Patient endorses compliance with amlodipine.  HLD  Has been stable on Lipitor.  Last LDL 73.  Hx of stroke  Doing well.  Compliant with Aspirin, Plavix, Statin.  Social Hx   Social History   Social History  . Marital status: Widowed    Spouse name: N/A  . Number of children: N/A  . Years of education: N/A   Social History Main Topics  . Smoking status: Former Smoker    Packs/day: 3.00    Years: 11.00    Quit date: 05/28/2015  . Smokeless tobacco: Never Used  . Alcohol use No  . Drug use: No  . Sexual activity: Not Currently   Other Topics Concern  . Not on file   Social History Narrative   Lives with son in [place]. Single, divorced. No pets in home.      Work - Retired Dealer.      Diet - regular      Exercise - limited.    Review of Systems  Respiratory: Negative.   Cardiovascular: Negative.    Objective:  BP (!) 170/68 (BP Location: Left Arm, Patient Position: Sitting, Cuff Size: Large)   Pulse 71   Temp 98.2 F (36.8 C) (Oral)   Resp 12   Wt 231 lb 2 oz (104.8 kg)   SpO2 97%   BMI 36.20 kg/m   BP/Weight 10/17/2016 10/24/238 12/23/3530  Systolic BP 992 426 834  Diastolic BP 68 70 72  Wt. (Lbs) 231.13 219.8 -  BMI 36.2 34.43 -   Physical Exam  Constitutional: He is oriented to person, place, and time. He appears well-developed. No distress.  Cardiovascular: Normal rate and regular rhythm.   Carotid bruit (R).  Pulmonary/Chest: Effort normal. He has no wheezes. He has no rales.  Neurological: He is alert and oriented to person, place, and time.  Psychiatric: He has a normal mood and affect.  Vitals reviewed.  Lab Results  Component Value Date   WBC 7.3 12/08/2015   HGB 13.7 12/08/2015   HCT 41.2 12/08/2015   PLT 222.0 12/08/2015   GLUCOSE 98 06/19/2015   CHOL 114 03/15/2016   TRIG 71.0 03/15/2016   HDL 27.20 (L) 03/15/2016   LDLCALC 73 03/15/2016   ALT 18 06/15/2015   AST 16 06/15/2015   NA 141 06/19/2015   K 4.0 06/19/2015   CL 108 06/19/2015   CREATININE 0.85 06/19/2015   BUN 14 06/19/2015   CO2 25 06/19/2015   INR 1.01 06/15/2015   HGBA1C 5.8 (H) 05/22/2015    Assessment & Plan:   Problem List Items Addressed This Visit      Cardiovascular and Mediastinum   Essential hypertension - Primary    BP elevated today. Improved on repeat. Continue amlodipine. Patient to check blood pressures daily at home.      Relevant Orders   Comprehensive metabolic panel     Other   History of CVA (cerebrovascular accident)    Doing well. Continue aspirin, Plavix, statin.       Hyperlipidemia    Lipid panel today. Continue Lipitor.      Relevant Orders   Lipid panel    Other Visit Diagnoses    History  of anemia       Relevant Orders   CBC   Blood glucose elevated       Relevant Orders   Hemoglobin A1c   Need for 23-polyvalent pneumococcal polysaccharide vaccine         Follow-up: Return for HTN follow up. - 3-6 months  Thersa Salt DO [facility]

## 2016-10-17 NOTE — Patient Instructions (Signed)
Check your BP at home. Let me know if your BP stays elevated.  Follow up in 3-6 months.  Take care  Dr. Lacinda Axon

## 2016-10-17 NOTE — Assessment & Plan Note (Signed)
BP elevated today. Improved on repeat. Continue amlodipine. Patient to check blood pressures daily at home.

## 2016-10-17 NOTE — Assessment & Plan Note (Signed)
Doing well. Continue aspirin, Plavix, statin.

## 2016-10-17 NOTE — Assessment & Plan Note (Signed)
Lipid panel today. Continue Lipitor.

## 2016-11-22 ENCOUNTER — Encounter: Payer: Self-pay | Admitting: Family Medicine

## 2017-02-05 DIAGNOSIS — Z23 Encounter for immunization: Secondary | ICD-10-CM | POA: Diagnosis not present

## 2017-03-16 ENCOUNTER — Other Ambulatory Visit: Payer: Self-pay | Admitting: Family Medicine

## 2017-03-16 NOTE — Telephone Encounter (Signed)
Can I refill Plavix the earliest appointment to transfer care from  Dr. Lacinda Axon to you was 05/21/17

## 2017-03-20 ENCOUNTER — Ambulatory Visit: Payer: Self-pay | Admitting: *Deleted

## 2017-03-20 NOTE — Telephone Encounter (Signed)
His daughter Afshin Chrystal called in stating that her father has been dizzy and weak for a week now.   (She is not with him now).    He is having to hold onto things to ambulate.   Unable to drive due to the dizziness.  Addie's brother informed her he was "having trouble seeing"   Unable to evaluate further since pt and daughter are not together.  His BP was 170/70 yesterday.  I encouraged them to take him to the ED to be evaluated due to his history of stroke and the fact he is holding onto things to ambulate which puts him at a high fall risk.  Addie is going to go let her father know it is recommended he go to the ED.   "I hope I can get him to go"   "He can be stubborn" I instructed her to call us back if she needed assistance.   She verbalized understanding.  She is going to take him to [facility] for evaluation.  Reason for Disposition . SEVERE dizziness (e.g., unable to stand, requires support to walk, feels like passing out now)  Answer Assessment - Initial Assessment Questions 1. DESCRIPTION: "Describe your dizziness."     Lightheaded and dizzy.   Holding onto things when he walks.   2. LIGHTHEADED: "Do you feel lightheaded?" (e.g., somewhat faint, woozy, weak upon standing)     dizzy 3. VERTIGO: "Do you feel like either you or the room is spinning or tilting?" (i.e. vertigo)     Never said that   (daughter is calling for father) 4. SEVERITY: "How bad is it?"  "Do you feel like you are going to faint?" "Can you stand and walk?"   - MILD - walking normally   - MODERATE - interferes with normal activities (e.g., work, school)    - SEVERE - unable to stand, requires support to walk, feels like passing out now.      He can't drive due to the dizziness. 5. ONSET:  "When did the dizziness begin?"     About a week ago 6. AGGRAVATING FACTORS: "Does anything make it worse?" (e.g., standing, change in head position)     Getting up and moving around 7. HEART RATE: "Can you tell me your heart rate?"  "How many beats in 15 seconds?"  (Note: not all patients can do this)       No history.   Daughter is not there with father. 8. CAUSE: "What do you think is causing the dizziness?"     Not anemic.   We take him food and I made him eat yesterday.   Nothing tastes the same since his stroke.   9. RECURRENT SYMPTOM: "Have you had dizziness before?" If so, ask: "When was the last time?" "What happened that time?"     No 10. OTHER SYMPTOMS: "Do you have any other symptoms?" (e.g., fever, chest pain, vomiting, diarrhea, bleeding)       No other symptoms 11. PREGNANCY: "Is there any chance you are pregnant?" "When was your last menstrual period?"       N/A  Protocols used: DIZZINESS [facility]

## 2017-03-20 NOTE — Telephone Encounter (Signed)
Patient notified

## 2017-03-20 NOTE — Telephone Encounter (Signed)
Noted. If he develops symptoms he should be evaluated. Thanks.

## 2017-03-20 NOTE — Telephone Encounter (Signed)
fyi

## 2017-03-20 NOTE — Telephone Encounter (Signed)
Please follow-up with patient to ensure that he was evaluated.  Thanks.

## 2017-03-20 NOTE — Telephone Encounter (Signed)
Patient states he did not go to be evaluated, he states he has not had any symptoms and he feels fine.

## 2017-03-21 ENCOUNTER — Emergency Department: Payer: Medicare Other

## 2017-03-21 ENCOUNTER — Emergency Department
Admission: EM | Admit: 2017-03-21 | Discharge: 2017-03-21 | Disposition: A | Discharge status: Home / Self Care | Payer: Medicare Other | Attending: Emergency Medicine | Admitting: Emergency Medicine

## 2017-03-21 ENCOUNTER — Encounter: Payer: Self-pay | Admitting: Emergency Medicine

## 2017-03-21 DIAGNOSIS — I69351 Hemiplegia and hemiparesis following cerebral infarction affecting right dominant side: Secondary | ICD-10-CM | POA: Diagnosis not present

## 2017-03-21 DIAGNOSIS — E785 Hyperlipidemia, unspecified: Secondary | ICD-10-CM | POA: Diagnosis present

## 2017-03-21 DIAGNOSIS — G459 Transient cerebral ischemic attack, unspecified: Secondary | ICD-10-CM | POA: Diagnosis not present

## 2017-03-21 DIAGNOSIS — G9389 Other specified disorders of brain: Secondary | ICD-10-CM | POA: Diagnosis not present

## 2017-03-21 DIAGNOSIS — C801 Malignant (primary) neoplasm, unspecified: Secondary | ICD-10-CM | POA: Diagnosis not present

## 2017-03-21 DIAGNOSIS — F05 Delirium due to known physiological condition: Secondary | ICD-10-CM | POA: Diagnosis present

## 2017-03-21 DIAGNOSIS — R3129 Other microscopic hematuria: Secondary | ICD-10-CM | POA: Diagnosis present

## 2017-03-21 DIAGNOSIS — R410 Disorientation, unspecified: Secondary | ICD-10-CM | POA: Diagnosis not present

## 2017-03-21 DIAGNOSIS — R4781 Slurred speech: Secondary | ICD-10-CM | POA: Insufficient documentation

## 2017-03-21 DIAGNOSIS — C3432 Malignant neoplasm of lower lobe, left bronchus or lung: Secondary | ICD-10-CM | POA: Diagnosis present

## 2017-03-21 DIAGNOSIS — R918 Other nonspecific abnormal finding of lung field: Secondary | ICD-10-CM | POA: Diagnosis not present

## 2017-03-21 DIAGNOSIS — G479 Sleep disorder, unspecified: Secondary | ICD-10-CM | POA: Diagnosis not present

## 2017-03-21 DIAGNOSIS — I10 Essential (primary) hypertension: Secondary | ICD-10-CM | POA: Diagnosis not present

## 2017-03-21 DIAGNOSIS — F919 Conduct disorder, unspecified: Secondary | ICD-10-CM | POA: Diagnosis present

## 2017-03-21 DIAGNOSIS — Z87891 Personal history of nicotine dependence: Secondary | ICD-10-CM | POA: Diagnosis not present

## 2017-03-21 DIAGNOSIS — R935 Abnormal findings on diagnostic imaging of other abdominal regions, including retroperitoneum: Secondary | ICD-10-CM | POA: Diagnosis not present

## 2017-03-21 DIAGNOSIS — N2889 Other specified disorders of kidney and ureter: Secondary | ICD-10-CM | POA: Diagnosis not present

## 2017-03-21 DIAGNOSIS — R278 Other lack of coordination: Secondary | ICD-10-CM | POA: Diagnosis present

## 2017-03-21 DIAGNOSIS — G939 Disorder of brain, unspecified: Secondary | ICD-10-CM | POA: Diagnosis not present

## 2017-03-21 DIAGNOSIS — N289 Disorder of kidney and ureter, unspecified: Secondary | ICD-10-CM | POA: Diagnosis not present

## 2017-03-21 DIAGNOSIS — Z79899 Other long term (current) drug therapy: Secondary | ICD-10-CM | POA: Diagnosis not present

## 2017-03-21 DIAGNOSIS — R2681 Unsteadiness on feet: Secondary | ICD-10-CM | POA: Diagnosis not present

## 2017-03-21 DIAGNOSIS — C719 Malignant neoplasm of brain, unspecified: Secondary | ICD-10-CM | POA: Diagnosis not present

## 2017-03-21 DIAGNOSIS — Z7982 Long term (current) use of aspirin: Secondary | ICD-10-CM | POA: Diagnosis not present

## 2017-03-21 DIAGNOSIS — R4701 Aphasia: Secondary | ICD-10-CM | POA: Diagnosis present

## 2017-03-21 DIAGNOSIS — Z9889 Other specified postprocedural states: Secondary | ICD-10-CM | POA: Diagnosis not present

## 2017-03-21 DIAGNOSIS — R911 Solitary pulmonary nodule: Secondary | ICD-10-CM | POA: Diagnosis not present

## 2017-03-21 DIAGNOSIS — C7931 Secondary malignant neoplasm of brain: Secondary | ICD-10-CM | POA: Diagnosis not present

## 2017-03-21 DIAGNOSIS — C3492 Malignant neoplasm of unspecified part of left bronchus or lung: Secondary | ICD-10-CM | POA: Diagnosis not present

## 2017-03-21 DIAGNOSIS — K869 Disease of pancreas, unspecified: Secondary | ICD-10-CM | POA: Diagnosis not present

## 2017-03-21 DIAGNOSIS — G936 Cerebral edema: Secondary | ICD-10-CM | POA: Diagnosis present

## 2017-03-21 DIAGNOSIS — R414 Neurologic neglect syndrome: Secondary | ICD-10-CM | POA: Diagnosis present

## 2017-03-21 LAB — CBC
HEMATOCRIT: 43.7 % (ref 40.0–52.0)
Hemoglobin: 14.3 g/dL (ref 13.0–18.0)
MCH: 28.6 pg (ref 26.0–34.0)
MCHC: 32.8 g/dL (ref 32.0–36.0)
MCV: 87.2 fL (ref 80.0–100.0)
PLATELETS: 223 10*3/uL (ref 150–440)
RBC: 5.01 MIL/uL (ref 4.40–5.90)
RDW: 13.6 % (ref 11.5–14.5)
WBC: 6.1 10*3/uL (ref 3.8–10.6)

## 2017-03-21 LAB — URINALYSIS, COMPLETE (UACMP) WITH MICROSCOPIC
Bacteria, UA: NONE SEEN
Bilirubin Urine: NEGATIVE
GLUCOSE, UA: NEGATIVE mg/dL
Ketones, ur: NEGATIVE mg/dL
Leukocytes, UA: NEGATIVE
NITRITE: NEGATIVE
PH: 6 (ref 5.0–8.0)
Protein, ur: 30 mg/dL — AB
SPECIFIC GRAVITY, URINE: 1.014 (ref 1.005–1.030)

## 2017-03-21 LAB — BASIC METABOLIC PANEL
Anion gap: 9 (ref 5–15)
BUN: 19 mg/dL (ref 6–20)
CHLORIDE: 107 mmol/L (ref 101–111)
CO2: 23 mmol/L (ref 22–32)
Calcium: 9.3 mg/dL (ref 8.9–10.3)
Creatinine, Ser: 1.07 mg/dL (ref 0.61–1.24)
GFR calc non Af Amer: >60 mL/min (ref >60)
Glucose, Bld: 135 mg/dL — ABNORMAL HIGH (ref 65–99)
POTASSIUM: 3.7 mmol/L (ref 3.5–5.1)
SODIUM: 139 mmol/L (ref 135–145)

## 2017-03-21 MED ORDER — DEXAMETHASONE SODIUM PHOSPHATE 10 MG/ML IJ SOLN
10.0000 mg | Freq: Once | INTRAMUSCULAR | Status: AC
  Administered 2017-03-21: 10 mg via INTRAVENOUS
  Filled 2017-03-21: qty 1

## 2017-03-21 MED ORDER — SODIUM CHLORIDE 0.9 % IV SOLN
1000.0000 mg | Freq: Once | INTRAVENOUS | Status: AC
  Administered 2017-03-21: 1000 mg via INTRAVENOUS
  Filled 2017-03-21: qty 10

## 2017-03-21 NOTE — ED Notes (Signed)
Pt transferred to [facility] via [employer] crew. Pt is visualized in NAD at this time. No change in patient condition.

## 2017-03-21 NOTE — ED Notes (Signed)
Dr. Archie Balboa in room to talk to family about CT findings.

## 2017-03-21 NOTE — ED Notes (Signed)
COPY  OF  CHART  AND  XRAY   SENT  WITH  [facility]

## 2017-03-21 NOTE — ED Triage Notes (Signed)
Pt to ED via POV, per family pt called this morning and had slurred speech and was not coherent, when family arrived pt symptoms resolved. Pt would not ride by EMS. Pt denies any stroke like symptoms this am, pt states he does not want to be here because " nothing is wrong". Pt has hx of stroke. PT has rt side deficits, speech clear.

## 2017-03-21 NOTE — ED Notes (Signed)
Pt here with daughter, she reports changes in slurred speech this morning and not coherent when his sister talked to him on the phone this morning around 0800 to 0830.  EMS was called but pt refused transport. Pt is alert and oriented and states he does not want to stay the night here. Daughter states pt is upset about being here.

## 2017-03-21 NOTE — Progress Notes (Signed)
Chaplain rounding ED was asked by nurse to see pt in ED [id number]. Pt has received bad news that he had cancers. Pt and family were devastated by the news. [place] listened to pt and family as they lamented and talked about moving pt to [facility] for further evaluation and treatment. CH validated the families feelings of loss and powerflessness. Upon request by family, [place] offered prayers for direction and comfort for pt and family. CH is available to follow [place] as needed.    03/21/17 1100  Clinical Encounter Type  Visited With Patient  Visit Type Initial;Spiritual support;Code;ED  Referral From Nurse  Consult/Referral To Chaplain  Spiritual Encounters  Spiritual Needs Prayer;Other (Comment)

## 2017-03-21 NOTE — ED Notes (Signed)
This RN and MD at bedside. This RN introduced self to patient and family. Pt resting in bed. Pt is noted to be mildly agitated and fiddling with monitoring equipment and mildly tearful. Pt states, "I'll go but I feel perfectly fine right now!" Pt repositioned in bed per his request. Will continue to monitor for further patient needs.

## 2017-03-21 NOTE — ED Notes (Signed)
Pt returned from CT °

## 2017-03-21 NOTE — ED Notes (Signed)
Pt's power of attorney signed for consent of transfer at this time.

## 2017-03-21 NOTE — ED Provider Notes (Signed)
[facility] Emergency Department Provider Note   ____________________________________________   I have reviewed the triage vital signs and the nursing notes.   HISTORY  Chief Complaint Transient Ischemic Attack   History limited by: Not Limited   HPI Jonathan Bowen is a 71 y.o. male who presents to the emergency department today because of episode of slurred speech.  DURATION:one week TIMING: intermittent SEVERITY: severe QUALITY: cannot get words out CONTEXT: family states that for the past week he has been having intermittent episodes of slurred and difficult speech. Had another episode this morning when he was on the phone with his sister. Sister was the one who called ems MODIFYING FACTORS: none ASSOCIATED SYMPTOMS: denies any headache. Denies any weakness or numbness in extremities.  Per medical record review patient has a history of smoking.  Past Medical History:  Diagnosis Date  . Complication of anesthesia    has never had surgery  . Hyperlipidemia   . Hypertension   . Smoker   . Stroke [facility])    just happened in early February    Patient Active Problem List   Diagnosis Date Noted  . History of CVA (cerebrovascular accident) 06/29/2015  . Carotid artery stenosis 06/18/2015  . Hyperlipidemia   . Essential hypertension     Past Surgical History:  Procedure Laterality Date  . ENDARTERECTOMY Right 06/18/2015   Procedure: ENDARTERECTOMY RIGHT CAROTID;  Surgeon: Rosetta Posner, MD;  Location: [place];  Service: Vascular;  Laterality: Right;  . PATCH ANGIOPLASTY Right 06/18/2015   Procedure: PATCH ANGIOPLASTY RIGHT CAROTID ARTERY;  Surgeon: Rosetta Posner, MD;  Location: [place];  Service: Vascular;  Laterality: Right;    Prior to Admission medications   Medication Sig Start Date End Date Taking? Authorizing Provider  amLODipine (NORVASC) 10 MG tablet Take 1 tablet (10 mg total) by mouth daily. 04/07/16   Coral Spikes, DO  aspirin EC 81 MG  tablet Take 81 mg by mouth daily.    [provider]  atorvastatin (LIPITOR) 80 MG tablet Take 1 tablet (80 mg total) by mouth daily at 6 PM. 04/07/16   Coral Spikes, DO  clopidogrel (PLAVIX) 75 MG tablet TAKE 1 TABLET (75 MG TOTAL) BY MOUTH DAILY. 03/16/17   [name], MD    Allergies Patient has no known allergies.  Family History  Problem Relation Age of Onset  . Diabetes Mother   . Cancer Father        Unknown of the type of cancer   . Diabetes Sister   . ALS Brother     Social History Social History   Tobacco Use  . Smoking status: Former Smoker    Packs/day: 3.00    Years: 11.00    Pack years: 33.00    Last attempt to quit: 05/28/2015    Years since quitting: 1.8  . Smokeless tobacco: Never Used  Substance Use Topics  . Alcohol use: No  . Drug use: No    Review of Systems Constitutional: No fever/chills Eyes: No visual changes. ENT: No sore throat. Cardiovascular: Denies chest pain. Respiratory: Denies shortness of breath. Gastrointestinal: No abdominal pain.  No nausea, no vomiting.  No diarrhea.   Genitourinary: Negative for dysuria. Musculoskeletal: Negative for back pain. Skin: Negative for rash. Neurological: Negative for headaches, focal weakness or numbness.  ____________________________________________   PHYSICAL EXAM:  VITAL SIGNS: ED Triage Vitals  Enc Vitals Group     BP 03/21/17 1017 (!) 143/114  Pulse Rate 03/21/17 1017 97     Resp 03/21/17 1017 16     Temp 03/21/17 1017 97.7 F (36.5 C)     Temp Source 03/21/17 1017 Oral     SpO2 03/21/17 1017 98 %     Weight 03/21/17 1017 160 lb (72.6 kg)     Height 03/21/17 1017 5\' 8"  (1.727 m)     Head Circumference --      Peak Flow --      Pain Score 03/21/17 1016 0   Constitutional: Alert and oriented. Well appearing and in no distress. Eyes: Conjunctivae are normal.  ENT   Head: Normocephalic and atraumatic.   Nose: No congestion/rhinnorhea.   Mouth/Throat:  Mucous membranes are moist.   Neck: No stridor. Hematological/Lymphatic/Immunilogical: No cervical lymphadenopathy. Cardiovascular: Normal rate, regular rhythm.  No murmurs, rubs, or gallops.  Respiratory: Normal respiratory effort without tachypnea nor retractions. Breath sounds are clear and equal bilaterally. No wheezes/rales/rhonchi. Gastrointestinal: Soft and non tender. No rebound. No guarding.  Genitourinary: Deferred Musculoskeletal: Normal range of motion in all extremities. No lower extremity edema. Neurologic:  Normal speech and language. No gross focal neurologic deficits are appreciated.  Skin:  Skin is warm, dry and intact. No rash noted. Psychiatric: Mood and affect are normal. Speech and behavior are normal. Patient exhibits appropriate insight and judgment.  ____________________________________________    LABS (pertinent positives/negatives)  CBC wnl BMP wnl except glu 135 UA rbcs 6-30  ____________________________________________   EKG  I, Nance Pear, attending physician, personally viewed and interpreted this EKG  EKG Time: 1018 Rate: 91 Rhythm: normal sinus rhythm Axis: normal Intervals: qtc 455 QRS: narrow ST changes: no st elevation, t wave inversion v1 Impression: abnormal ekg   ____________________________________________    RADIOLOGY  CT head Mass with edema on left side.   ____________________________________________   PROCEDURES  Procedures  CRITICAL CARE Performed by: Nance Pear   Total critical care time: 35 minutes  Critical care time was exclusive of separately billable procedures and treating other patients.  Critical care was necessary to treat or prevent imminent or life-threatening deterioration.  Critical care was time spent personally by me on the following activities: development of treatment plan with patient and/or surrogate as well as nursing, discussions with consultants, evaluation of patient's  response to treatment, examination of patient, obtaining history from patient or surrogate, ordering and performing treatments and interventions, ordering and review of laboratory studies, ordering and review of radiographic studies, pulse oximetry and re-evaluation of patient's condition.  ____________________________________________   INITIAL IMPRESSION / ASSESSMENT AND PLAN / ED COURSE  Pertinent labs & imaging results that were available during my care of the patient were reviewed by me and considered in my medical decision making (see chart for details).  Patient presented to the emergency department today because of episode of slurred speech that had resolved by the time my examination.  Differential would include TIA, stroke, intracranial mass or bleed, infection, toxic ingestion among other etiologies.  CT scan was concerning for multiple masses with associated edema on the left side.  Patient has no known history of cancer.  Discussed with Dr. Cari Caraway of [place] neurosurgery.  Did recommend transferred to [facility].  Patient was given Keppra and steroids.  Discussed findings and concern for metastatic cancer with patient and family.   ____________________________________________   FINAL CLINICAL IMPRESSION(S) / ED DIAGNOSES  Final diagnoses:  Brain mass     Note: This dictation was prepared with Dragon dictation. Any transcriptional errors  that result from this process are unintentional     Nance Pear, MD 03/21/17 1616

## 2017-03-21 NOTE — ED Notes (Signed)
Verbal orders for CT head given by MD Joni Fears

## 2017-03-21 NOTE — ED Notes (Signed)
Pt noted to be eating, this RN instructed patient after speaking with MD that per MD he was not to eat or drink. Pt and family state understanding.

## 2017-03-28 ENCOUNTER — Telehealth: Payer: Self-pay | Admitting: *Deleted

## 2017-03-28 ENCOUNTER — Telehealth: Payer: Self-pay

## 2017-03-28 DIAGNOSIS — R918 Other nonspecific abnormal finding of lung field: Secondary | ICD-10-CM

## 2017-03-28 NOTE — Telephone Encounter (Signed)
Copied from [place]. Topic: Appointment Scheduling - Scheduling Inquiry for Clinic >> Mar 28, 2017 12:01 PM Scherrie Gerlach wrote: Reason for CRM: Joann with [facility] calling to ask for pt to have a hospital follow with 5-7 days.  Pt is being discharged today. Arville Go needs a call back at [phone]   >> Mar 28, 2017  3:59 PM Boyd Kerbs wrote: [place] again, he is needing to see Dr. Caryl Bis with in  7-10 days.  He is checking out, Please call patient on cell for date and time.

## 2017-03-28 NOTE — Telephone Encounter (Signed)
Oncology Nurse Navigator Documentation  Oncology Nurse Navigator Flowsheets 03/28/2017  Navigator Location [facility]  Referral date to RadOnc/MedOnc 03/28/2017  Navigator Encounter Type Telephone/I received referral today on Jonathan Bowen.  I called and scheduled him to be seen this Friday.   Telephone Outgoing Call  Treatment Phase Pre-Tx/Tx Discussion  Barriers/Navigation Needs Coordination of Care  Interventions Coordination of Care  Coordination of Care Appts  Acuity Level 1  Time Spent with Patient 30

## 2017-03-28 NOTE — Telephone Encounter (Signed)
Copied from [place]. Topic: Appointment Scheduling - Scheduling Inquiry for Clinic >> Mar 28, 2017 12:01 PM Scherrie Gerlach wrote: Reason for CRM: Joann with [facility] calling to ask for pt to have a hospital follow with 5-7 days.  Pt is being discharged today. Arville Go needs a call back at [phone]

## 2017-03-29 MED ORDER — MELATONIN 3 MG PO TABS
3.00 | ORAL_TABLET | ORAL | Status: DC
Start: 2017-03-28 — End: 2017-03-29

## 2017-03-29 MED ORDER — PANTOPRAZOLE SODIUM 40 MG PO TBEC
40.00 | DELAYED_RELEASE_TABLET | ORAL | Status: DC
Start: 2017-03-29 — End: 2017-03-29

## 2017-03-29 MED ORDER — LIDOCAINE HCL (PF) 1 % IJ SOLN
0.50 | INTRAMUSCULAR | Status: DC
Start: ? — End: 2017-03-29

## 2017-03-29 MED ORDER — FA-PYRIDOXINE-CYANOCOBALAMIN 2.5-25-2 MG PO TABS
1.00 | ORAL_TABLET | ORAL | Status: DC
Start: 2017-03-29 — End: 2017-03-29

## 2017-03-29 MED ORDER — ACETAMINOPHEN 325 MG PO TABS
650.00 | ORAL_TABLET | ORAL | Status: DC
Start: ? — End: 2017-03-29

## 2017-03-29 MED ORDER — LISINOPRIL 5 MG PO TABS
5.00 | ORAL_TABLET | ORAL | Status: DC
Start: 2017-03-29 — End: 2017-03-29

## 2017-03-29 MED ORDER — ALBUTEROL SULFATE (2.5 MG/3ML) 0.083% IN NEBU
2.50 | INHALATION_SOLUTION | RESPIRATORY_TRACT | Status: DC
Start: ? — End: 2017-03-29

## 2017-03-29 MED ORDER — AMLODIPINE BESYLATE 10 MG PO TABS
10.00 | ORAL_TABLET | ORAL | Status: DC
Start: 2017-03-29 — End: 2017-03-29

## 2017-03-29 MED ORDER — ACETAMINOPHEN 500 MG PO TABS
1000.00 | ORAL_TABLET | ORAL | Status: DC
Start: 2017-03-28 — End: 2017-03-29

## 2017-03-29 MED ORDER — SODIUM CHLORIDE 0.9 % IJ SOLN
5.00 | INTRAMUSCULAR | Status: DC
Start: 2017-03-29 — End: 2017-03-29

## 2017-03-29 MED ORDER — OLANZAPINE 10 MG PO TABS
5.00 | ORAL_TABLET | ORAL | Status: DC
Start: 2017-03-29 — End: 2017-03-29

## 2017-03-29 MED ORDER — ONDANSETRON 4 MG PO TBDP
4.00 | ORAL_TABLET | ORAL | Status: DC
Start: ? — End: 2017-03-29

## 2017-03-29 MED ORDER — TRAZODONE HCL 50 MG PO TABS
25.00 | ORAL_TABLET | ORAL | Status: DC
Start: ? — End: 2017-03-29

## 2017-03-29 MED ORDER — ATORVASTATIN CALCIUM 40 MG PO TABS
80.00 | ORAL_TABLET | ORAL | Status: DC
Start: 2017-03-28 — End: 2017-03-29

## 2017-03-29 MED ORDER — TRAZODONE HCL 50 MG PO TABS
50.00 | ORAL_TABLET | ORAL | Status: DC
Start: 2017-03-28 — End: 2017-03-29

## 2017-03-29 MED ORDER — DEXAMETHASONE 4 MG PO TABS
6.00 | ORAL_TABLET | ORAL | Status: DC
Start: 2017-03-29 — End: 2017-03-29

## 2017-03-29 NOTE — Telephone Encounter (Signed)
04/03/17 at 430.  Thanks.

## 2017-03-29 NOTE — Telephone Encounter (Signed)
Need appointment to place patient for HFU from [place] Patient in for Discharge Diagnoses:  Principal Problem: Lesion of cerebellum   Requesting 7 to 10 day Follow up.

## 2017-03-30 ENCOUNTER — Telehealth: Payer: Self-pay

## 2017-03-30 ENCOUNTER — Ambulatory Visit (HOSPITAL_BASED_OUTPATIENT_CLINIC_OR_DEPARTMENT_OTHER): Payer: Medicare Other | Admitting: Oncology

## 2017-03-30 ENCOUNTER — Other Ambulatory Visit: Payer: Self-pay

## 2017-03-30 ENCOUNTER — Encounter: Payer: Self-pay | Admitting: *Deleted

## 2017-03-30 ENCOUNTER — Encounter: Payer: Self-pay | Admitting: Oncology

## 2017-03-30 ENCOUNTER — Other Ambulatory Visit (HOSPITAL_BASED_OUTPATIENT_CLINIC_OR_DEPARTMENT_OTHER): Payer: Medicare Other

## 2017-03-30 ENCOUNTER — Other Ambulatory Visit: Payer: Self-pay | Admitting: *Deleted

## 2017-03-30 VITALS — BP 122/96 | HR 67 | Temp 97.7°F | Resp 18 | Ht 68.0 in | Wt 226.4 lb

## 2017-03-30 DIAGNOSIS — I10 Essential (primary) hypertension: Secondary | ICD-10-CM | POA: Diagnosis not present

## 2017-03-30 DIAGNOSIS — K869 Disease of pancreas, unspecified: Secondary | ICD-10-CM

## 2017-03-30 DIAGNOSIS — G939 Disorder of brain, unspecified: Secondary | ICD-10-CM

## 2017-03-30 DIAGNOSIS — Z87891 Personal history of nicotine dependence: Secondary | ICD-10-CM | POA: Diagnosis not present

## 2017-03-30 DIAGNOSIS — R296 Repeated falls: Secondary | ICD-10-CM

## 2017-03-30 DIAGNOSIS — M545 Low back pain: Secondary | ICD-10-CM

## 2017-03-30 DIAGNOSIS — N289 Disorder of kidney and ureter, unspecified: Secondary | ICD-10-CM

## 2017-03-30 DIAGNOSIS — Z8673 Personal history of transient ischemic attack (TIA), and cerebral infarction without residual deficits: Secondary | ICD-10-CM

## 2017-03-30 DIAGNOSIS — R918 Other nonspecific abnormal finding of lung field: Secondary | ICD-10-CM

## 2017-03-30 DIAGNOSIS — C3490 Malignant neoplasm of unspecified part of unspecified bronchus or lung: Secondary | ICD-10-CM

## 2017-03-30 DIAGNOSIS — C3432 Malignant neoplasm of lower lobe, left bronchus or lung: Secondary | ICD-10-CM

## 2017-03-30 DIAGNOSIS — Z7189 Other specified counseling: Secondary | ICD-10-CM

## 2017-03-30 DIAGNOSIS — F0390 Unspecified dementia without behavioral disturbance: Secondary | ICD-10-CM

## 2017-03-30 DIAGNOSIS — Z809 Family history of malignant neoplasm, unspecified: Secondary | ICD-10-CM | POA: Diagnosis not present

## 2017-03-30 DIAGNOSIS — R0609 Other forms of dyspnea: Secondary | ICD-10-CM | POA: Diagnosis not present

## 2017-03-30 LAB — COMPREHENSIVE METABOLIC PANEL
ALT: 30 U/L (ref 0–55)
AST: 17 U/L (ref 5–34)
Albumin: 3.6 g/dL (ref 3.5–5.0)
Alkaline Phosphatase: 65 U/L (ref 40–150)
Anion Gap: 9 mEq/L (ref 3–11)
BILIRUBIN TOTAL: 0.32 mg/dL (ref 0.20–1.20)
BUN: 33.6 mg/dL — ABNORMAL HIGH (ref 7.0–26.0)
CO2: 20 meq/L — AB (ref 22–29)
Calcium: 8.5 mg/dL (ref 8.4–10.4)
Chloride: 109 mEq/L (ref 98–109)
Creatinine: 0.9 mg/dL (ref 0.7–1.3)
GLUCOSE: 83 mg/dL (ref 70–140)
Potassium: 4.4 mEq/L (ref 3.5–5.1)
SODIUM: 138 meq/L (ref 136–145)
TOTAL PROTEIN: 6.3 g/dL — AB (ref 6.4–8.3)

## 2017-03-30 LAB — CBC WITH DIFFERENTIAL/PLATELET
BASO%: 0 % (ref 0.0–2.0)
BASOS ABS: 0 10*3/uL (ref 0.0–0.1)
EOS%: 0 % (ref 0.0–7.0)
Eosinophils Absolute: 0 10*3/uL (ref 0.0–0.5)
HEMATOCRIT: 39.9 % (ref 38.4–49.9)
HGB: 13 g/dL (ref 13.0–17.1)
LYMPH#: 1.1 10*3/uL (ref 0.9–3.3)
LYMPH%: 11.2 % — ABNORMAL LOW (ref 14.0–49.0)
MCH: 28.8 pg (ref 27.2–33.4)
MCHC: 32.6 g/dL (ref 32.0–36.0)
MCV: 88.5 fL (ref 79.3–98.0)
MONO#: 1.1 10*3/uL — AB (ref 0.1–0.9)
MONO%: 11.4 % (ref 0.0–14.0)
NEUT#: 7.7 10*3/uL — ABNORMAL HIGH (ref 1.5–6.5)
NEUT%: 77.4 % — AB (ref 39.0–75.0)
PLATELETS: 234 10*3/uL (ref 140–400)
RBC: 4.51 10*6/uL (ref 4.20–5.82)
RDW: 13.7 % (ref 11.0–14.6)
WBC: 9.9 10*3/uL (ref 4.0–10.3)

## 2017-03-30 NOTE — Assessment & Plan Note (Signed)
This is a 71 year old white male with newly diagnosed extensive stage small cell lung cancer diagnosed in December 2018.  The patient presented with slurred speech and dizziness.  The patient has brain metastases and is currently on dexamethasone 6 mg twice a day.  The patient was seen with Dr. Earlie Server who had a lengthy discussion with the patient and his children.  We discussed the findings of the MRI of the pain, CT of the chest, abdomen, pelvis, and biopsy results.  We discussed the current disease status, prognosis, and treatment options.  We recommend proceeding with a PET scan to complete the staging workup.  We also recommend radiation to his brain metastases and left lung mass.  Continue dexamethasone 6 mg twice a day.  Once radiation is complete, recommend systemic chemotherapy with carboplatin for an AUC of 5 on day 1 and etoposide 100 mg/m given days 1 through 3 of a 21-day cycle.  Plan would be to give 4-6 cycles of chemotherapy.  The patient has expressed his desire to be treated closer to home in [place].  A referral has been placed to radiation oncology at [facility].  Referral has been placed for medical oncology at [facility].  Will defer ordering of the PET scan to his medical oncologist in [place].  Additional follow-up will occur at [facility].  We will be happy to see the patient back if the need arises.  He was advised to call immediately if he has any concerning symptoms in the interval. The patient voices understanding of current disease status and treatment options and is in agreement with the current care plan. All questions were answered. The patient knows to call the clinic with any problems, questions or concerns.

## 2017-03-30 NOTE — Progress Notes (Signed)
  Oncology Nurse Navigator Documentation  Navigator Location: [facility] (03/30/17 1400) Referral date to RadOnc/MedOnc: 03/30/17 (03/30/17 1400) )Navigator Encounter Type: Introductory phone call (03/30/17 1400)   Abnormal Finding Date: 03/21/17 (03/30/17 1400) Confirmed Diagnosis Date: 03/27/17 (03/30/17 1400)                   Barriers/Navigation Needs: Coordination of Care (03/30/17 1400)   Interventions: Coordination of Care (03/30/17 1400)   Coordination of Care: Appts;Radiology (03/30/17 1400)        Acuity: Level 2 (03/30/17 1400)   Acuity Level 2: Initial guidance, education and coordination as needed;Assistance expediting appointments (03/30/17 1400)  phone call made to pt's daughter, Abby, to review scheduled appts for patient and to introduce to navigator services. Reviewed upcoming appts for PET and consultation with Dr. Grayland Ormond. Contact info given and instructed to call with any further questions or needs. Abby verbalized understanding.   Time Spent with Patient: 30 (03/30/17 1400)

## 2017-03-30 NOTE — Progress Notes (Signed)
[facility] Initial Visit:  Patient Care Team: [name], MD as PCP - General (Family Medicine)  REASON FOR CONSULTATION: 71 year old white male recently diagnosed with extensive stage small cell lung cancer.  The patient presented to the emergency room with possible CVA symptoms including slurred speech and was found to have a 2.3 cm mass in the left parietal lobe with vasogenic edema.   No history exists.    HISTORY OF PRESENTING ILLNESS: Jonathan Bowen 71 y.o. male has a past medical history significant for hypertension, hyperlipidemia, history of CVA, dementia, and history of tobacco use quit in 2017.  The patient presented to the emergency room at [facility] with slurred speech.  There was concern for TIA/CVA and the patient underwent a CT of the head without contrast.  This showed a 2.3 cm mass in the left parietal lobe with vasogenic edema.  There is also edema within the left cerebellar hemisphere likely due to an underlying mass or lesion.  The patient was subsequently transferred to [facility] for further evaluation.  The patient had a CT of the chest, abdomen, pelvis left at [facility] which showed a left lower lobe lung lesion as well as a pancreatic head lesion and subcentimeter renal lesion.  MRI of the brain was performed which showed a 3.8 x 2.9 cm heterogeneous mass seen at the left anterior superior cerebellum with the cystic and solid components.  There is also a smaller 3 x 2.4 cm heterogeneously enhancing mass with the cystic and solid components at the left high parietal occipital region.  The patient had moderate to severe vasogenic edema.  The patient was started on dexamethasone . The patient underwent a CT-guided needle core biopsy ([id number]) which was consistent with small cell lung carcinoma.  But the patient wished to be discharged and wanted to be treated closer to home.  He is seen in our clinic today to establish care and discuss  treatment options.  When seen today, the patient gets agitated and does not want to answer questions.  He states that he feels fine overall with the exception of being weak in the legs and hips.  He denies fevers and chills.  Denies chest pain, shortness of breath, cough, hemoptysis.  Denies nausea, vomiting, constipation, diarrhea.  Denies weight loss.  The patient's daughter indicates that he has fallen 6 times since being discharged from [place].  He denies any injuries.  The patient denies any family history of cancer, anemia, and bleeding disorders.  However, upon review of electronic medical record, there is a mention of his father having an unknown type of cancer.  The patient denies any surgical history, review of the electronic medical record shows that he has had an endarterectomy and a patch angioplasty both in 2017.  The patient is divorced.  He has 2 adult children, 1 son and 1 daughter.  He currently lives with his son in [place], [place].  The patient used to work as an Clinical biochemist.  The patient quit smoking in 2017, but smoked 2-3 packs of cigarettes per day for approximately 50 years.  Denies alcohol and illicit drug use.  The patient presented to the clinic with his daughter, Josuel Koeppen.  His son, Jidenna Figgs, joined [place] at the end of the visit.  Review of Systems  Constitutional: Negative for appetite change, chills, fatigue and fever.  HENT:   Negative for mouth sores, nosebleeds and sore throat.   Eyes: Negative for eye problems and  icterus.  Respiratory: Negative for chest tightness, cough, hemoptysis and shortness of breath.   Cardiovascular: Negative for chest pain, leg swelling and palpitations.  Gastrointestinal: Negative for abdominal distention, abdominal pain, constipation, diarrhea, nausea and vomiting.  Genitourinary: Negative for difficulty urinating, dysuria, frequency and hematuria.   Musculoskeletal: Negative for arthralgias, back pain, flank pain and myalgias.   Skin: Negative for itching, rash and wound.  Neurological: Positive for dizziness and extremity weakness. Negative for headaches, light-headedness, numbness and seizures.       Positive for falls.  Hematological: Negative for adenopathy. Does not bruise/bleed easily.  Psychiatric/Behavioral: Positive for confusion and sleep disturbance. Negative for depression.       Positive for agitation.    MEDICAL HISTORY: Past Medical History:  Diagnosis Date  . Complication of anesthesia    has never had surgery  . Hyperlipidemia   . Hypertension   . Smoker   . Stroke [facility])    just happened in early February    SURGICAL HISTORY: Past Surgical History:  Procedure Laterality Date  . ENDARTERECTOMY Right 06/18/2015   Procedure: ENDARTERECTOMY RIGHT CAROTID;  Surgeon: Rosetta Posner, MD;  Location: [facility];  Service: Vascular;  Laterality: Right;  . PATCH ANGIOPLASTY Right 06/18/2015   Procedure: PATCH ANGIOPLASTY RIGHT CAROTID ARTERY;  Surgeon: Rosetta Posner, MD;  Location: [facility] OR;  Service: Vascular;  Laterality: Right;    SOCIAL HISTORY: Social History   Socioeconomic History  . Marital status: Widowed    Spouse name: Not on file  . Number of children: Not on file  . Years of education: Not on file  . Highest education level: Not on file  Social Needs  . Financial resource strain: Not on file  . Food insecurity - worry: Not on file  . Food insecurity - inability: Not on file  . Transportation needs - medical: Not on file  . Transportation needs - non-medical: Not on file  Occupational History  . Not on file  Tobacco Use  . Smoking status: Former Smoker    Packs/day: 3.00    Years: 50.00    Pack years: 150.00    Last attempt to quit: 05/28/2015    Years since quitting: 1.8  . Smokeless tobacco: Never Used  Substance and Sexual Activity  . Alcohol use: No  . Drug use: No  . Sexual activity: Not Currently  Other Topics Concern  . Not on file  Social History Narrative   Lives with  son in [place]. Single, divorced. No pets in home.      Work - Retired Dealer.      Diet - regular      Exercise - limited.    FAMILY HISTORY Family History  Problem Relation Age of Onset  . Diabetes Mother   . Cancer Father        Unknown of the type of cancer   . Diabetes Sister   . ALS Brother     ALLERGIES:  has No Known Allergies.  MEDICATIONS:  Current Outpatient Medications  Medication Sig Dispense Refill  . amLODipine (NORVASC) 10 MG tablet Take 1 tablet (10 mg total) by mouth daily. 90 tablet 3  . atorvastatin (LIPITOR) 80 MG tablet Take 1 tablet (80 mg total) by mouth daily at 6 PM. 90 tablet 3  . dexamethasone (DECADRON) 6 MG tablet Take 6 mg by mouth 2 (two) times daily.    . folic acid (FOLVITE) 1 MG tablet Take 1 mg by mouth daily.    Marland Kitchen  OLANZapine (ZYPREXA) 5 MG tablet Take 5 mg by mouth at bedtime.    . pantoprazole (PROTONIX) 40 MG tablet Take 40 mg by mouth daily.    . traZODone (DESYREL) 50 MG tablet Take 50 mg by mouth at bedtime.    Marland Kitchen aspirin EC 81 MG tablet Take 81 mg by mouth daily.    . clopidogrel (PLAVIX) 75 MG tablet TAKE 1 TABLET (75 MG TOTAL) BY MOUTH DAILY. (Patient not taking: Reported on 03/30/2017) 90 tablet 1   No current facility-administered medications for this visit.     PHYSICAL EXAMINATION:  ECOG PERFORMANCE STATUS: 2 - Symptomatic, <50% confined to bed   Vitals:   03/30/17 0944 03/30/17 0955  BP:  (!) 122/96  Pulse: 67   Resp: 18   Temp: 97.7 F (36.5 C)   SpO2: 100%     Filed Weights   03/30/17 0944  Weight: 226 lb 6.4 oz (102.7 kg)     Physical Exam  Constitutional: He is oriented to person, place, and time and well-developed, well-nourished, and in no distress. No distress.  HENT:  Head: Normocephalic and atraumatic.  Mouth/Throat: Oropharynx is clear and moist. No oropharyngeal exudate.  Eyes: Conjunctivae and EOM are normal. Pupils are equal, round, and reactive to light. Right eye exhibits no discharge.  Left eye exhibits no discharge. No scleral icterus.  Neck: Normal range of motion. Neck supple. No JVD present. No tracheal deviation present.  Cardiovascular: Normal rate, regular rhythm, normal heart sounds and intact distal pulses.  No murmur heard. Pulmonary/Chest: Effort normal and breath sounds normal. No stridor. No respiratory distress. He has no wheezes. He has no rales.  Abdominal: Soft. Bowel sounds are normal. He exhibits no distension and no mass. There is no tenderness.  Musculoskeletal: Normal range of motion. He exhibits no edema.  Lymphadenopathy:    He has no cervical adenopathy.  Neurological: He is alert and oriented to person, place, and time. He displays normal reflexes. He exhibits normal muscle tone. Gait normal. Coordination normal.  Skin: Skin is warm and dry. No rash noted. He is not diaphoretic. No erythema. No pallor.  Psychiatric:  The patient is very agitated.  He does not like to answer questions.  Vitals reviewed.    LABORATORY DATA: I have personally reviewed the data as listed:  Appointment on 03/30/2017  Component Date Value Ref Range Status  . Sodium 03/30/2017 138  136 - 145 mEq/L Final  . Potassium 03/30/2017 4.4  3.5 - 5.1 mEq/L Final  . Chloride 03/30/2017 109  98 - 109 mEq/L Final  . CO2 03/30/2017 20* 22 - 29 mEq/L Final  . Glucose 03/30/2017 83  70 - 140 mg/dl Final   Glucose reference range is for nonfasting patients. Fasting glucose reference range is 70- 100.  Marland Kitchen BUN 03/30/2017 33.6* 7.0 - 26.0 mg/dL Final  . Creatinine 03/30/2017 0.9  0.7 - 1.3 mg/dL Final  . Total Bilirubin 03/30/2017 0.32  0.20 - 1.20 mg/dL Final  . Alkaline Phosphatase 03/30/2017 65  40 - 150 U/L Final  . AST 03/30/2017 17  5 - 34 U/L Final  . ALT 03/30/2017 30  0 - 55 U/L Final  . Total Protein 03/30/2017 6.3* 6.4 - 8.3 g/dL Final  . Albumin 03/30/2017 3.6  3.5 - 5.0 g/dL Final  . Calcium 03/30/2017 8.5  8.4 - 10.4 mg/dL Final  . Anion Gap 03/30/2017 9  3 - 11  mEq/L Final  . EGFR 03/30/2017 >60  >60 ml/min/1.73 m2  Final   eGFR is calculated using the CKD-EPI Creatinine Equation (2009)  . WBC 03/30/2017 9.9  4.0 - 10.3 10e3/uL Final  . NEUT# 03/30/2017 7.7* 1.5 - 6.5 10e3/uL Final  . HGB 03/30/2017 13.0  13.0 - 17.1 g/dL Final  . HCT 03/30/2017 39.9  38.4 - 49.9 % Final  . Platelets 03/30/2017 234  140 - 400 10e3/uL Final  . MCV 03/30/2017 88.5  79.3 - 98.0 fL Final  . MCH 03/30/2017 28.8  27.2 - 33.4 pg Final  . MCHC 03/30/2017 32.6  32.0 - 36.0 g/dL Final  . RBC 03/30/2017 4.51  4.20 - 5.82 10e6/uL Final  . RDW 03/30/2017 13.7  11.0 - 14.6 % Final  . lymph# 03/30/2017 1.1  0.9 - 3.3 10e3/uL Final  . MONO# 03/30/2017 1.1* 0.1 - 0.9 10e3/uL Final  . Eosinophils Absolute 03/30/2017 0.0  0.0 - 0.5 10e3/uL Final  . Basophils Absolute 03/30/2017 0.0  0.0 - 0.1 10e3/uL Final  . NEUT% 03/30/2017 77.4* 39.0 - 75.0 % Final  . LYMPH% 03/30/2017 11.2* 14.0 - 49.0 % Final  . MONO% 03/30/2017 11.4  0.0 - 14.0 % Final  . EOS% 03/30/2017 0.0  0.0 - 7.0 % Final  . BASO% 03/30/2017 0.0  0.0 - 2.0 % Final  Admission on 03/21/2017, Discharged on 03/21/2017  Component Date Value Ref Range Status  . Sodium 03/21/2017 139  135 - 145 mmol/L Final  . Potassium 03/21/2017 3.7  3.5 - 5.1 mmol/L Final  . Chloride 03/21/2017 107  101 - 111 mmol/L Final  . CO2 03/21/2017 23  22 - 32 mmol/L Final  . Glucose, Bld 03/21/2017 135* 65 - 99 mg/dL Final  . BUN 03/21/2017 19  6 - 20 mg/dL Final  . Creatinine, Ser 03/21/2017 1.07  0.61 - 1.24 mg/dL Final  . Calcium 03/21/2017 9.3  8.9 - 10.3 mg/dL Final  . GFR calc non Af Amer 03/21/2017 >60  >60 mL/min Final  . GFR calc Af Amer 03/21/2017 >60  >60 mL/min Final   Comment: (NOTE) The eGFR has been calculated using the CKD EPI equation. This calculation has not been validated in all clinical situations. eGFR's persistently <60 mL/min signify possible Chronic Kidney Disease.   . Anion gap 03/21/2017 9  5 - 15 Final  . WBC  03/21/2017 6.1  3.8 - 10.6 K/uL Final  . RBC 03/21/2017 5.01  4.40 - 5.90 MIL/uL Final  . Hemoglobin 03/21/2017 14.3  13.0 - 18.0 g/dL Final  . HCT 03/21/2017 43.7  40.0 - 52.0 % Final  . MCV 03/21/2017 87.2  80.0 - 100.0 fL Final  . MCH 03/21/2017 28.6  26.0 - 34.0 pg Final  . MCHC 03/21/2017 32.8  32.0 - 36.0 g/dL Final  . RDW 03/21/2017 13.6  11.5 - 14.5 % Final  . Platelets 03/21/2017 223  150 - 440 K/uL Final  . Color, Urine 03/21/2017 YELLOW* YELLOW Final  . APPearance 03/21/2017 CLEAR* CLEAR Final  . Specific Gravity, Urine 03/21/2017 1.014  1.005 - 1.030 Final  . pH 03/21/2017 6.0  5.0 - 8.0 Final  . Glucose, UA 03/21/2017 NEGATIVE  NEGATIVE mg/dL Final  . Hgb urine dipstick 03/21/2017 SMALL* NEGATIVE Final  . Bilirubin Urine 03/21/2017 NEGATIVE  NEGATIVE Final  . Ketones, ur 03/21/2017 NEGATIVE  NEGATIVE mg/dL Final  . Protein, ur 03/21/2017 30* NEGATIVE mg/dL Final  . Nitrite 03/21/2017 NEGATIVE  NEGATIVE Final  . Leukocytes, UA 03/21/2017 NEGATIVE  NEGATIVE Final  . RBC / HPF 03/21/2017 6-30  0 - 5 RBC/hpf Final  . WBC, UA 03/21/2017 0-5  0 - 5 WBC/hpf Final  . Bacteria, UA 03/21/2017 NONE SEEN  NONE SEEN Final  . Squamous Epithelial / LPF 03/21/2017 0-5* NONE SEEN Final  . Mucus 03/21/2017 PRESENT   Final    RADIOGRAPHIC STUDIES: I have personally reviewed the radiological images as listed and agree with the findings in the report  No results found.  ASSESSMENT/PLAN  Small cell lung cancer [facility]) This is a 71 year old white male with newly diagnosed extensive stage small cell lung cancer diagnosed in December 2018.  The patient presented with slurred speech and dizziness.  The patient has brain metastases and is currently on dexamethasone 6 mg twice a day.  The patient was seen with Dr. Earlie Server who had a lengthy discussion with the patient and his children.  We discussed the findings of the MRI of the pain, CT of the chest, abdomen, pelvis, and biopsy results.  We  discussed the current disease status, prognosis, and treatment options.  We recommend proceeding with a PET scan to complete the staging workup.  We also recommend radiation to his brain metastases and left lung mass.  Continue dexamethasone 6 mg twice a day.  Once radiation is complete, recommend systemic chemotherapy with carboplatin for an AUC of 5 on day 1 and etoposide 100 mg/m given days 1 through 3 of a 21-day cycle.  Plan would be to give 4-6 cycles of chemotherapy.  The patient has expressed his desire to be treated closer to home in [place].  A referral has been placed to radiation oncology at [facility].  Referral has been placed for medical oncology at [facility].  Will defer ordering of the PET scan to his medical oncologist in [place].  Additional follow-up will occur at [facility].  We will be happy to see the patient back if the need arises.  He was advised to call immediately if he has any concerning symptoms in the interval. The patient voices understanding of current disease status and treatment options and is in agreement with the current care plan. All questions were answered. The patient knows to call the clinic with any problems, questions or concerns.     Orders Placed This Encounter  Procedures  . Ambulatory referral to Oncology    Referral Priority:   Urgent    Referral Type:   Consultation    Referral Reason:   Specialty Services Required    Referred to Provider:   Lloyd Huger, MD    Number of Visits Requested:   1  . Ambulatory referral to Radiation Oncology    Referral Priority:   Urgent    Referral Type:   Consultation    Referral Reason:   Specialty Services Required    Referred to Provider:   Noreene Filbert, MD    Requested Specialty:   Radiation Oncology    Number of Visits Requested:   1    All questions were answered. The patient knows to call the clinic with any problems, questions or  concerns.    Mikey Bussing, NP  03/30/2017 1:53 PM   ADDENDUM: Hematology/Oncology Attending: I had a face-to-face encounter with the patient.  I recommended his care plan.  This is a very pleasant 71 years old white male with multiple medical problems who presented to the emergency department at [facility] it was a slurred speech and concern for a stroke.  CT of the head  without contrast performed at at the emergency department showed 2.3 cm mass in the left parietal lobe with vasogenic edema. The patient decided to go to [facility] for further evaluation.  He had MRI of the brain as well as CT scan of the chest, abdomen and pelvis performed at that time.  The MRI of the brain showed severe 0.8 x 2.9 cm heterogeneous mass at the left anterior superior cerebellar with cystic and solid components as well as a smaller 3.0 x 2.4 cm enhancing mass with cystic and solid component at the left high parietal occipital region.  There was also moderate to severe vasogenic edema.  He was  started on dexamethasone and currently on 6 mg p.o. twice daily.  CT of the chest, abdomen and pelvis showed left lower lobe lung mass as well as pancreatic head lesion and subcentimeter renal lesion. The patient underwent CT-guided core biopsy of the left lower lobe lung mass and the final pathology was consistent with a small cell carcinoma. He was referred to me today for evaluation and recommendation regarding his condition except for the baseline shortness of breath and fatigue.  He also has intermittent pain in the lower back. When seen today the patient is feeling fine.  He gets agitated very easily with his children. I had a lengthy discussion with the patient and his family today about his current condition and further treatment options.  I recommended for the patient to complete his staging workup by ordering a PET scan to rule out the presence of other metastatic disease  besides the left lower lobe lung mass and the suspicious pancreatic head lesion. I also discussed with the patient has treatment options and recommended for him to proceed with whole brain irradiation for the metastatic lesions in the brain.  He could also benefit from palliative radiotherapy to the left lower lobe lung mass for now during his radiation to the brain. After completion of the radiotherapy, I would recommend for the patient systemic chemotherapy with carboplatin and etoposide but palliative care and hospice referral is a reasonable option if the patient declined systemic chemotherapy. The patient mentions that he lives a few minutes away from [facility] and he would like his treatment to be done and that facility. I agreed with the patient for the convenience.  We will refer him to medical oncology and radiation oncology at: [place] [facility]. The patient was advised to call if he has any concerning symptoms in the interval.  Disclaimer: This note was dictated with voice recognition software. Similar sounding words can inadvertently be transcribed and may be missed upon review. Eilleen Kempf, MD 03/31/17

## 2017-03-30 NOTE — Telephone Encounter (Signed)
Copied from [place]. Topic: Appointment Scheduling - Scheduling Inquiry for Clinic >> Mar 28, 2017 12:01 PM Scherrie Gerlach wrote: Reason for CRM: Joann with [facility] calling to ask for pt to have a hospital follow with 5-7 days.  Pt is being discharged today. Arville Go needs a call back at [phone]   >> Mar 28, 2017  3:59 PM Boyd Kerbs wrote: [place] again, he is needing to see Dr. Caryl Bis with in  7-10 days.  He is checking out, Please call patient on cell for date and time.  >> Mar 30, 2017 11:03 AM Synthia Innocent wrote: Patient daughter is calling, Hospital follow up is scheduled for 05/04/17, need something asap. Please advise

## 2017-04-02 NOTE — Progress Notes (Signed)
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  INTERVAL HISTORY: Patient is a 71 year old male who was initially worked up at [facility] and found to have an isolated lung lesion that is biopsy-proven small cell lung cancer.  He was also noted to have multiple brain metastasis.  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  He denies any neurologic complaints.  There is no report of fevers.  He has no chest pain or shortness of breath.  He denies any nausea, vomiting, constipation, or diarrhea.  He has a good appetite.  He has no urinary complaints.  Patient offers no specific complaints today.  REVIEW OF SYSTEMS:   Review of Systems  Constitutional: Negative.  Negative for fever, malaise/fatigue and weight loss.  Respiratory: Negative.  Negative for cough and shortness of breath.   Cardiovascular: Negative.  Negative for chest pain and leg swelling.  Gastrointestinal: Negative.  Negative for abdominal pain.  Genitourinary: Negative.   Musculoskeletal: Negative.   Skin: Negative.  Negative for rash.  Neurological: Negative.  Negative for sensory change, focal weakness, seizures, weakness and headaches.  Psychiatric/Behavioral: Positive for memory loss.    As per HPI. Otherwise, a complete review of systems is negative.  PAST MEDICAL HISTORY: Past Medical History:  Diagnosis Date  . Complication of anesthesia    has never had surgery  . Dementia 2018  . Hyperlipidemia   . Hypertension   . Smoker   . Stroke [facility])    just happened in early February    PAST SURGICAL HISTORY: Past Surgical History:  Procedure Laterality Date  .  ENDARTERECTOMY Right 06/18/2015   Procedure: ENDARTERECTOMY RIGHT CAROTID;  Surgeon: Rosetta Posner, MD;  Location: [place];  Service: Vascular;  Laterality: Right;  . PATCH ANGIOPLASTY Right 06/18/2015   Procedure: PATCH ANGIOPLASTY RIGHT CAROTID ARTERY;  Surgeon: Rosetta Posner, MD;  Location: [facility] OR;  Service: Vascular;  Laterality: Right;    FAMILY HISTORY: Family History  Problem Relation Age of Onset  . Diabetes Mother   . Cancer Father        Unknown of the type of cancer   . Diabetes Sister   . ALS Brother     ADVANCED DIRECTIVES (Y/N):  N  HEALTH MAINTENANCE: Social History   Tobacco Use  . Smoking status: Former Smoker    Packs/day: 3.00    Years: 50.00    Pack years: 150.00    Last attempt to quit: 05/28/2015    Years since quitting: 1.8  . Smokeless tobacco: Never Used  Substance Use Topics  . Alcohol use: No  . Drug use: No     Colonoscopy:  PAP:  Bone density:  Lipid panel:  No Known Allergies  Current Outpatient Medications  Medication Sig Dispense Refill  . amLODipine (NORVASC) 10 MG tablet Take 1 tablet (10 mg total) by mouth daily. 90 tablet 3  . aspirin EC 81 MG tablet Take 81 mg by mouth daily.    Marland Kitchen atorvastatin (LIPITOR) 80 MG tablet Take 1 tablet (80 mg total) by mouth daily at 6 PM. 90 tablet 3  . dexamethasone (DECADRON) 6 MG tablet Take 6 mg by mouth 2 (two) times daily.    Marland Kitchen  folic acid (FOLVITE) 1 MG tablet Take 1 mg by mouth daily.    . pantoprazole (PROTONIX) 40 MG tablet Take 40 mg by mouth daily.    . traZODone (DESYREL) 50 MG tablet Take 50 mg by mouth at bedtime as needed for sleep.     . clopidogrel (PLAVIX) 75 MG tablet TAKE 1 TABLET (75 MG TOTAL) BY MOUTH DAILY. 90 tablet 1   No current facility-administered medications for this visit.     OBJECTIVE: Vitals:   04/04/17 1342  BP: (!) 162/72  Pulse: 82  Resp: 20  Temp: (!) 95.7 F (35.4 C)     Body mass index is 35.91 kg/m.    ECOG FS:0 - Asymptomatic  General: Well-developed,  well-nourished, no acute distress. Eyes: Pink conjunctiva, anicteric sclera. HEENT: Normocephalic, moist mucous membranes, clear oropharnyx. Lungs: Clear to auscultation bilaterally. Heart: Regular rate and rhythm. No rubs, murmurs, or gallops. Abdomen: Soft, nontender, nondistended. No organomegaly noted, normoactive bowel sounds. Musculoskeletal: No edema, cyanosis, or clubbing. Neuro: Confused but alert. Cranial nerves grossly intact. Skin: No rashes or petechiae noted. Psych: Normal affect. Lymphatics: No cervical, calvicular, axillary or inguinal LAD.   LAB RESULTS:  Lab Results  Component Value Date   NA 138 03/30/2017   K 4.4 03/30/2017   CL 107 03/21/2017   CO2 20 (L) 03/30/2017   GLUCOSE 83 03/30/2017   BUN 33.6 (H) 03/30/2017   CREATININE 0.9 03/30/2017   CALCIUM 8.5 03/30/2017   PROT 6.3 (L) 03/30/2017   ALBUMIN 3.6 03/30/2017   AST 17 03/30/2017   ALT 30 03/30/2017   ALKPHOS 65 03/30/2017   BILITOT 0.32 03/30/2017   GFRNONAA >60 03/21/2017   GFRAA >60 03/21/2017    Lab Results  Component Value Date   WBC 9.9 03/30/2017   NEUTROABS 7.7 (H) 03/30/2017   HGB 13.0 03/30/2017   HCT 39.9 03/30/2017   MCV 88.5 03/30/2017   PLT 234 03/30/2017     STUDIES: Ct Head Wo Contrast  Result Date: 03/21/2017 CLINICAL DATA:  Slurred speech EXAM: CT HEAD WITHOUT CONTRAST TECHNIQUE: Contiguous axial images were obtained from the base of the skull through the vertex without intravenous contrast. COMPARISON:  CT 05/21/2015.  MRI to 05/22/2015. FINDINGS: Brain: Abnormal low-density noted within the left cerebellar hemisphere, new since prior studies. There is also new low-density throughout the deep white matter within the left parietal lobe, surrounding what appears to be a focal lesion in the posterior parietal lobe measuring 2.3 x 2.3 cm. Findings are concerning for metastases in these 2 areas. Changes of old left MCA infarct in the basal ganglia and periventricular white  matter on the left. No hemorrhage or hydrocephalus. Vascular: No hyperdense vessel or unexpected calcification. Skull: No acute calvarial abnormality. Sinuses/Orbits: Visualized paranasal sinuses and mastoids clear. Orbital soft tissues unremarkable. Other: None IMPRESSION: Edema noted within the left parietal lobe around a 2.3 cm mass lesion. There is also edema within the left cerebellar hemisphere, likely also related to an underlying mass lesion. Findings concerning for metastases. These could be further evaluated with MRI with and without contrast. These results were called by telephone at the time of interpretation on 03/21/2017 at 10:52 am to Dr. Carrie Mew , who verbally acknowledged these results. Electronically Signed   By: Rolm Baptise M.D.   On: 03/21/2017 10:53   Nm Pet Image Initial (pi) Skull Base To Thigh  Result Date: 04/03/2017 CLINICAL DATA:  Initial treatment strategy for small cell carcinoma of the lung, metastatic to  the brain EXAM: NUCLEAR MEDICINE PET SKULL BASE TO THIGH TECHNIQUE: 13.3 mCi F-18 FDG was injected intravenously. Full-ring PET imaging was performed from the skull base to thigh after the radiotracer. CT data was obtained and used for attenuation correction and anatomic localization. FASTING BLOOD GLUCOSE:  Value: 113 mg/dl COMPARISON:  Multiple exams, including 03/22/2017 FINDINGS: NECK AND HEAD Primarily hypermetabolic mass of the left parietal lobe with surrounding vasogenic edema, maximum SUV 10.4. Cystic and solid mass of the left lateral cerebellum, maximum SUV in the solid elements 8.8. Asymmetric hypoactivity in the left lentiform nuclei due to prior infarcts. The smaller potential lesion in the right frontal lobe is not appreciably hypermetabolic but is likely below sensitive PET-CT size thresholds. 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 No hypermetabolic adenopathy in the neck or other abnormal metabolic activity in the neck. Mild chronic left maxillary  sinusitis. CHEST The dominant left lower lobe pulmonary nodule measures 2.4 by 2.1 cm on image 136/3, and has a maximum standard uptake value of 7.5. Bilateral airway thickening especially in the lower lobes. No hypermetabolic or pathologically enlarged adenopathy observed in the chest. Atherosclerotic calcification of the aortic arch and branch vessels. ABDOMEN/PELVIS No abnormal hypermetabolic activity within the liver, pancreas, adrenal glands, or spleen. No hypermetabolic lymph nodes in the abdomen or pelvis. Focal accentuated activity at the level of the anal sphincter without appreciable CT abnormality, maximum SUV 7.1, most likely to be physiologic. Incidental fat density in the descending duodenum compatible with a small lipoma. Aortoiliac atherosclerotic vascular disease. Sigmoid colon diverticulosis. Curvilinear calcification in the prostate gland. No significant abnormal hypermetabolic activity in the vicinity of the 8 by 7 mm pancreatic head hypodense lesion. The 0.8 by 0.6 cm exophytic lesion from the right kidney lower pole is mildly hyperdense precontrast and is probably a complex cyst, but technically too small to characterize. SKELETON Mildly accentuated focal activity in the thoracic spine at the T5-6 level eccentric to the right, maximum SUV 3.8, without appreciable CT correlate. Activity at other vertebral levels is in the 3.4 range, hence this may simply be incidental or due to degenerative findings. IMPRESSION: 1. Hypermetabolic left lower lobe pulmonary nodule, maximum SUV 7.5, compatible with malignancy. 2. Hypermetabolic masses in the left parietal lobe and left cerebellum favoring metastatic lesions. A questionably enhancing lesion in the right frontal lobe is not appreciably hypermetabolic on PET-CT, but is probably below sensitive PET-CT size thresholds. 3. No findings of hypermetabolic adenopathy in the chest, or metastatic disease to the abdomen/pelvis. 4. Mildly accentuated focal  activity eccentric to the right at the T5-6 level, barely above background activity level, probably due to spondylosis or incidental given that there is no correlate on the CT data. This area may warrant surveillance on follow up imaging. 5. Small hypodense lesion in the pancreatic head is not appreciably hypermetabolic, but may warrant surveillance. 6. Small complex lesion of the right kidney demonstrates hyperdensity on the noncontrast CT data, and is statistically most likely to be a small benign complex cyst, but also warrant surveillance. 7. Other imaging findings of potential clinical significance: Mild chronic left maxillary sinusitis. Aortic Atherosclerosis (ICD10-I70.0). Sigmoid colon diverticulosis. Old left lentiform nucleus infarcts. Electronically Signed   By: Van Clines M.D.   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  PLAN:    1. 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  Hospice and end-of-life care were briefly discussed, but patient is not interested in pursuing this option at this time.  He will benefit from a palliative care outpatient consult.  Referral has been made to radiation oncology for consideration of XRT to his brain lesions.  We will also consider XRT concurrent with carboplatinum and etoposide for his isolated lung lesion.  Patient does not appear to have any mediastinal or hilar lymphadenopathy.  Will require port placement prior to initiating treatment.  Return to clinic on April 18, 2017 initiate cycle 1 of 4 of carboplatinum and etoposide. 2.  Brain metastasis: Referral to radiation oncology as above.  Outpatient palliative care consult.  Continue Decadron as prescribed.   Patient expressed understanding and was in agreement with this plan. He also understands that He can call clinic  at any time with any questions, concerns, or complaints.   Cancer Staging Small cell lung cancer [facility]) Staging form: Lung, AJCC 8th Edition - Clinical stage from 04/06/2017: Stage IV (cT1, cN0, pM1c) - Signed by Lloyd Huger, MD on 04/06/2017   Lloyd Huger, MD   04/06/2017 11:57 AM

## 2017-04-03 ENCOUNTER — Ambulatory Visit
Admission: RE | Admit: 2017-04-03 | Discharge: 2017-04-03 | Disposition: A | Discharge status: Home / Self Care | Payer: Medicare Other | Source: Ambulatory Visit | Attending: Oncology | Admitting: Oncology

## 2017-04-03 ENCOUNTER — Ambulatory Visit
Admission: RE | Admit: 2017-04-03 | Discharge: 2017-04-03 | Disposition: A | Discharge status: Home / Self Care | Payer: Self-pay | Source: Ambulatory Visit | Attending: Oncology | Admitting: Oncology

## 2017-04-03 ENCOUNTER — Other Ambulatory Visit: Payer: Self-pay | Admitting: Oncology

## 2017-04-03 DIAGNOSIS — N289 Disorder of kidney and ureter, unspecified: Secondary | ICD-10-CM | POA: Diagnosis not present

## 2017-04-03 DIAGNOSIS — D496 Neoplasm of unspecified behavior of brain: Secondary | ICD-10-CM | POA: Diagnosis not present

## 2017-04-03 DIAGNOSIS — C3490 Malignant neoplasm of unspecified part of unspecified bronchus or lung: Secondary | ICD-10-CM | POA: Diagnosis not present

## 2017-04-03 DIAGNOSIS — J320 Chronic maxillary sinusitis: Secondary | ICD-10-CM | POA: Insufficient documentation

## 2017-04-03 DIAGNOSIS — C7931 Secondary malignant neoplasm of brain: Secondary | ICD-10-CM | POA: Diagnosis not present

## 2017-04-03 DIAGNOSIS — K5730 Diverticulosis of large intestine without perforation or abscess without bleeding: Secondary | ICD-10-CM | POA: Insufficient documentation

## 2017-04-03 DIAGNOSIS — I700 Atherosclerosis of aorta: Secondary | ICD-10-CM | POA: Diagnosis not present

## 2017-04-03 LAB — GLUCOSE, CAPILLARY: GLUCOSE-CAPILLARY: 113 mg/dL — AB (ref 65–99)

## 2017-04-03 MED ORDER — FLUDEOXYGLUCOSE F - 18 (FDG) INJECTION
13.3000 | Freq: Once | INTRAVENOUS | Status: AC | PRN
  Administered 2017-04-03: 13.3 via INTRAVENOUS

## 2017-04-03 NOTE — Telephone Encounter (Signed)
Tried to reach patient by phone no answer left voicemail to call office dr. Caryl Bis would  Like patient to come in today for HFU from [place] at 4 .30 if patient returns call please schedule. PEC may schedule.

## 2017-04-04 ENCOUNTER — Encounter: Payer: Self-pay | Admitting: *Deleted

## 2017-04-04 ENCOUNTER — Other Ambulatory Visit: Payer: Self-pay

## 2017-04-04 ENCOUNTER — Inpatient Hospital Stay: Payer: Medicare Other | Attending: Oncology | Admitting: Oncology

## 2017-04-04 ENCOUNTER — Encounter: Payer: Self-pay | Admitting: Oncology

## 2017-04-04 VITALS — BP 162/72 | HR 82 | Temp 95.7°F | Resp 20 | Ht 67.32 in | Wt 231.5 lb

## 2017-04-04 DIAGNOSIS — F0390 Unspecified dementia without behavioral disturbance: Secondary | ICD-10-CM | POA: Diagnosis not present

## 2017-04-04 DIAGNOSIS — C7951 Secondary malignant neoplasm of bone: Secondary | ICD-10-CM | POA: Diagnosis not present

## 2017-04-04 DIAGNOSIS — Z809 Family history of malignant neoplasm, unspecified: Secondary | ICD-10-CM | POA: Diagnosis not present

## 2017-04-04 DIAGNOSIS — I10 Essential (primary) hypertension: Secondary | ICD-10-CM | POA: Diagnosis not present

## 2017-04-04 DIAGNOSIS — Z79899 Other long term (current) drug therapy: Secondary | ICD-10-CM | POA: Diagnosis not present

## 2017-04-04 DIAGNOSIS — J320 Chronic maxillary sinusitis: Secondary | ICD-10-CM | POA: Diagnosis not present

## 2017-04-04 DIAGNOSIS — Z7982 Long term (current) use of aspirin: Secondary | ICD-10-CM | POA: Insufficient documentation

## 2017-04-04 DIAGNOSIS — E785 Hyperlipidemia, unspecified: Secondary | ICD-10-CM | POA: Diagnosis not present

## 2017-04-04 DIAGNOSIS — Z87891 Personal history of nicotine dependence: Secondary | ICD-10-CM

## 2017-04-04 DIAGNOSIS — C3490 Malignant neoplasm of unspecified part of unspecified bronchus or lung: Secondary | ICD-10-CM

## 2017-04-04 DIAGNOSIS — K5730 Diverticulosis of large intestine without perforation or abscess without bleeding: Secondary | ICD-10-CM | POA: Insufficient documentation

## 2017-04-04 DIAGNOSIS — Z8673 Personal history of transient ischemic attack (TIA), and cerebral infarction without residual deficits: Secondary | ICD-10-CM | POA: Diagnosis not present

## 2017-04-04 DIAGNOSIS — C3432 Malignant neoplasm of lower lobe, left bronchus or lung: Secondary | ICD-10-CM | POA: Diagnosis not present

## 2017-04-04 DIAGNOSIS — I700 Atherosclerosis of aorta: Secondary | ICD-10-CM | POA: Insufficient documentation

## 2017-04-04 NOTE — Patient Instructions (Signed)
Atezolizumab injection What is this medicine? ATEZOLIZUMAB (a te zoe LIZ ue mab) is a monoclonal antibody. It is used to treat bladder cancer (urothelial cancer) and non-small cell lung cancer. This medicine may be used for other purposes; ask your health care provider or pharmacist if you have questions. COMMON BRAND NAME(S): Tecentriq What should I tell my health care provider before I take this medicine? They need to know if you have any of these conditions: -diabetes -immune system problems -infection -inflammatory bowel disease -liver disease -lung or breathing disease -lupus -nervous system problems like myasthenia gravis or Guillain-Barre syndrome -organ transplant -an unusual or allergic reaction to atezolizumab, other medicines, foods, dyes, or preservatives -pregnant or trying to get pregnant -breast-feeding How should I use this medicine? This medicine is for infusion into a vein. It is given by a health care professional in a hospital or clinic setting. A special MedGuide will be given to you before each treatment. Be sure to read this information carefully each time. Talk to your pediatrician regarding the use of this medicine in children. Special care may be needed. Overdosage: If you think you have taken too much of this medicine contact a poison control center or emergency room at once. NOTE: This medicine is only for you. Do not share this medicine with others. What if I miss a dose? It is important not to miss your dose. Call your doctor or health care professional if you are unable to keep an appointment. What may interact with this medicine? Interactions have not been studied. This list may not describe all possible interactions. Give your health care provider a list of all the medicines, herbs, non-prescription drugs, or dietary supplements you use. Also tell them if you smoke, drink alcohol, or use illegal drugs. Some items may interact with your medicine. What  should I watch for while using this medicine? Your condition will be monitored carefully while you are receiving this medicine. You may need blood work done while you are taking this medicine. Do not become pregnant while taking this medicine or for at least 5 months after stopping it. Women should inform their doctor if they wish to become pregnant or think they might be pregnant. There is a potential for serious side effects to an unborn child. Talk to your health care professional or pharmacist for more information. Do not breast-feed an infant while taking this medicine or for at least 5 months after the last dose. What side effects may I notice from receiving this medicine? Side effects that you should report to your doctor or health care professional as soon as possible: -allergic reactions like skin rash, itching or hives, swelling of the face, lips, or tongue -black, tarry stools -bloody or watery diarrhea -breathing problems -changes in vision -chest pain or chest tightness -chills -facial flushing -fever -headache -signs and symptoms of high blood sugar such as dizziness; dry mouth; dry skin; fruity breath; nausea; stomach pain; increased hunger or thirst; increased urination -signs and symptoms of liver injury like dark yellow or brown urine; general ill feeling or flu-like symptoms; light-colored stools; loss of appetite; nausea; right upper belly pain; unusually weak or tired; yellowing of the eyes or skin -stomach pain -trouble passing urine or change in the amount of urine Side effects that usually do not require medical attention (report to your doctor or health care professional if they continue or are bothersome): -cough -diarrhea -joint pain -muscle pain -muscle weakness -tiredness -weight loss This list may not describe all   possible side effects. Call your doctor for medical advice about side effects. You may report side effects to FDA at 1-800-FDA-1088. Where should  I keep my medicine? This drug is given in a hospital or clinic and will not be stored at home. NOTE: This sheet is a summary. It may not cover all possible information. If you have questions about this medicine, talk to your doctor, pharmacist, or health care provider.  2018 Elsevier/Gold Standard (2015-05-05 17:54:14) Etoposide, VP-16 injection What is this medicine? ETOPOSIDE, VP-16 (e toe POE side) is a chemotherapy drug. It is used to treat testicular cancer, lung cancer, and other cancers. This medicine may be used for other purposes; ask your health care provider or pharmacist if you have questions. COMMON BRAND NAME(S): Etopophos, Toposar, VePesid What should I tell my health care provider before I take this medicine? They need to know if you have any of these conditions: -infection -kidney disease -liver disease -low blood counts, like low white cell, platelet, or red cell counts -an unusual or allergic reaction to etoposide, other medicines, foods, dyes, or preservatives -pregnant or trying to get pregnant -breast-feeding How should I use this medicine? This medicine is for infusion into a vein. It is administered in a hospital or clinic by a specially trained health care professional. Talk to your pediatrician regarding the use of this medicine in children. Special care may be needed. Overdosage: If you think you have taken too much of this medicine contact a poison control center or emergency room at once. NOTE: This medicine is only for you. Do not share this medicine with others. What if I miss a dose? It is important not to miss your dose. Call your doctor or health care professional if you are unable to keep an appointment. What may interact with this medicine? -aspirin -certain medications for seizures like carbamazepine, phenobarbital, phenytoin, valproic acid -cyclosporine -levamisole -warfarin This list may not describe all possible interactions. Give your health  care provider a list of all the medicines, herbs, non-prescription drugs, or dietary supplements you use. Also tell them if you smoke, drink alcohol, or use illegal drugs. Some items may interact with your medicine. What should I watch for while using this medicine? Visit your doctor for checks on your progress. This drug may make you feel generally unwell. This is not uncommon, as chemotherapy can affect healthy cells as well as cancer cells. Report any side effects. Continue your course of treatment even though you feel ill unless your doctor tells you to stop. In some cases, you may be given additional medicines to help with side effects. Follow all directions for their use. Call your doctor or health care professional for advice if you get a fever, chills or sore throat, or other symptoms of a cold or flu. Do not treat yourself. This drug decreases your body's ability to fight infections. Try to avoid being around people who are sick. This medicine may increase your risk to bruise or bleed. Call your doctor or health care professional if you notice any unusual bleeding. Talk to your doctor about your risk of cancer. You may be more at risk for certain types of cancers if you take this medicine. Do not become pregnant while taking this medicine or for at least 6 months after stopping it. Women should inform their doctor if they wish to become pregnant or think they might be pregnant. Women of child-bearing potential will need to have a negative pregnancy test before starting this medicine. There   is a potential for serious side effects to an unborn child. Talk to your health care professional or pharmacist for more information. Do not breast-feed an infant while taking this medicine. Men must use a latex condom during sexual contact with a woman while taking this medicine and for at least 4 months after stopping it. A latex condom is needed even if you have had a vasectomy. Contact your doctor right away  if your partner becomes pregnant. Do not donate sperm while taking this medicine and for at least 4 months after you stop taking this medicine. Men should inform their doctors if they wish to father a child. This medicine may lower sperm counts. What side effects may I notice from receiving this medicine? Side effects that you should report to your doctor or health care professional as soon as possible: -allergic reactions like skin rash, itching or hives, swelling of the face, lips, or tongue -low blood counts - this medicine may decrease the number of white blood cells, red blood cells and platelets. You may be at increased risk for infections and bleeding. -signs of infection - fever or chills, cough, sore throat, pain or difficulty passing urine -signs of decreased platelets or bleeding - bruising, pinpoint red spots on the skin, black, tarry stools, blood in the urine -signs of decreased red blood cells - unusually weak or tired, fainting spells, lightheadedness -breathing problems -changes in vision -mouth or throat sores or ulcers -pain, redness, swelling or irritation at the injection site -pain, tingling, numbness in the hands or feet -redness, blistering, peeling or loosening of the skin, including inside the mouth -seizures -vomiting Side effects that usually do not require medical attention (report to your doctor or health care professional if they continue or are bothersome): -diarrhea -hair loss -loss of appetite -nausea -stomach pain This list may not describe all possible side effects. Call your doctor for medical advice about side effects. You may report side effects to FDA at 1-800-FDA-1088. Where should I keep my medicine? This drug is given in a hospital or clinic and will not be stored at home. NOTE: This sheet is a summary. It may not cover all possible information. If you have questions about this medicine, talk to your doctor, pharmacist, or health care provider.   2018 Elsevier/Gold Standard (2015-03-26 11:53:23) Carboplatin injection What is this medicine? CARBOPLATIN (KAR boe pla tin) is a chemotherapy drug. It targets fast dividing cells, like cancer cells, and causes these cells to die. This medicine is used to treat ovarian cancer and many other cancers. This medicine may be used for other purposes; ask your health care provider or pharmacist if you have questions. COMMON BRAND NAME(S): Paraplatin What should I tell my health care provider before I take this medicine? They need to know if you have any of these conditions: -blood disorders -hearing problems -kidney disease -recent or ongoing radiation therapy -an unusual or allergic reaction to carboplatin, cisplatin, other chemotherapy, other medicines, foods, dyes, or preservatives -pregnant or trying to get pregnant -breast-feeding How should I use this medicine? This drug is usually given as an infusion into a vein. It is administered in a hospital or clinic by a specially trained health care professional. Talk to your pediatrician regarding the use of this medicine in children. Special care may be needed. Overdosage: If you think you have taken too much of this medicine contact a poison control center or emergency room at once. NOTE: This medicine is only for you. Do not share   this medicine with others. What if I miss a dose? It is important not to miss a dose. Call your doctor or health care professional if you are unable to keep an appointment. What may interact with this medicine? -medicines for seizures -medicines to increase blood counts like filgrastim, pegfilgrastim, sargramostim -some antibiotics like amikacin, gentamicin, neomycin, streptomycin, tobramycin -vaccines Talk to your doctor or health care professional before taking any of these medicines: -acetaminophen -aspirin -ibuprofen -ketoprofen -naproxen This list may not describe all possible interactions. Give your health  care provider a list of all the medicines, herbs, non-prescription drugs, or dietary supplements you use. Also tell them if you smoke, drink alcohol, or use illegal drugs. Some items may interact with your medicine. What should I watch for while using this medicine? Your condition will be monitored carefully while you are receiving this medicine. You will need important blood work done while you are taking this medicine. This drug may make you feel generally unwell. This is not uncommon, as chemotherapy can affect healthy cells as well as cancer cells. Report any side effects. Continue your course of treatment even though you feel ill unless your doctor tells you to stop. In some cases, you may be given additional medicines to help with side effects. Follow all directions for their use. Call your doctor or health care professional for advice if you get a fever, chills or sore throat, or other symptoms of a cold or flu. Do not treat yourself. This drug decreases your body's ability to fight infections. Try to avoid being around people who are sick. This medicine may increase your risk to bruise or bleed. Call your doctor or health care professional if you notice any unusual bleeding. Be careful brushing and flossing your teeth or using a toothpick because you may get an infection or bleed more easily. If you have any dental work done, tell your dentist you are receiving this medicine. Avoid taking products that contain aspirin, acetaminophen, ibuprofen, naproxen, or ketoprofen unless instructed by your doctor. These medicines may hide a fever. Do not become pregnant while taking this medicine. Women should inform their doctor if they wish to become pregnant or think they might be pregnant. There is a potential for serious side effects to an unborn child. Talk to your health care professional or pharmacist for more information. Do not breast-feed an infant while taking this medicine. What side effects may I  notice from receiving this medicine? Side effects that you should report to your doctor or health care professional as soon as possible: -allergic reactions like skin rash, itching or hives, swelling of the face, lips, or tongue -signs of infection - fever or chills, cough, sore throat, pain or difficulty passing urine -signs of decreased platelets or bleeding - bruising, pinpoint red spots on the skin, black, tarry stools, nosebleeds -signs of decreased red blood cells - unusually weak or tired, fainting spells, lightheadedness -breathing problems -changes in hearing -changes in vision -chest pain -high blood pressure -low blood counts - This drug may decrease the number of white blood cells, red blood cells and platelets. You may be at increased risk for infections and bleeding. -nausea and vomiting -pain, swelling, redness or irritation at the injection site -pain, tingling, numbness in the hands or feet -problems with balance, talking, walking -trouble passing urine or change in the amount of urine Side effects that usually do not require medical attention (report to your doctor or health care professional if they continue or are bothersome): -  hair loss -loss of appetite -metallic taste in the mouth or changes in taste This list may not describe all possible side effects. Call your doctor for medical advice about side effects. You may report side effects to FDA at 1-800-FDA-1088. Where should I keep my medicine? This drug is given in a hospital or clinic and will not be stored at home. NOTE: This sheet is a summary. It may not cover all possible information. If you have questions about this medicine, talk to your doctor, pharmacist, or health care provider.  2018 Elsevier/Gold Standard (2007-07-09 14:38:05)  

## 2017-04-04 NOTE — Progress Notes (Signed)
Here for new pt evaluation. Pt agitated and occasionally loud and verbally aggressive . Son assited w history but also doesn't know some hx. Daughter on phone gave some hx.   Pt wanting to leave-demanding Dr come see him.

## 2017-04-04 NOTE — Progress Notes (Signed)
  Oncology Nurse Navigator Documentation  Navigator Location: [facility] (04/04/17 1500)   )Navigator Encounter Type: Initial MedOnc (04/04/17 1500)                       Treatment Phase: Pre-Tx/Tx Discussion (04/04/17 1500) Barriers/Navigation Needs: Coordination of Care;Education (04/04/17 1500) Education: Understanding Cancer/ Treatment Options;Newly Diagnosed Cancer Education (04/04/17 1500) Interventions: Coordination of Care (04/04/17 1500)   Coordination of Care: Appts;Chemo (04/04/17 1500)     met pt and pt's son during initial med-onc consultation with Dr. Grayland Ormond. All questions answered at the time of visit. Pt and son given education materials regarding diagnosis and supportive services available. Reviewed upcoming appts and instructed on date, time, and location. Informed pt and pt's son that they will be notified by Dr. Bunnie Domino office on when port placement is scheduled. Contact info given and instructed to call with any further questions or needs. Pt and son verbalized understanding.             Time Spent with Patient: 60 (04/04/17 1500)

## 2017-04-05 ENCOUNTER — Other Ambulatory Visit (INDEPENDENT_AMBULATORY_CARE_PROVIDER_SITE_OTHER): Payer: Self-pay

## 2017-04-05 ENCOUNTER — Encounter (INDEPENDENT_AMBULATORY_CARE_PROVIDER_SITE_OTHER): Payer: Self-pay

## 2017-04-05 ENCOUNTER — Telehealth: Payer: Self-pay | Admitting: *Deleted

## 2017-04-05 ENCOUNTER — Inpatient Hospital Stay: Payer: Medicare Other

## 2017-04-05 ENCOUNTER — Other Ambulatory Visit (INDEPENDENT_AMBULATORY_CARE_PROVIDER_SITE_OTHER): Payer: Self-pay | Admitting: Vascular Surgery

## 2017-04-05 NOTE — Telephone Encounter (Signed)
[facility] called to report that the patient's daughter was requestion palliative care. If Dr. Grayland Ormond is in agreement please fax order to [phone].

## 2017-04-05 NOTE — Telephone Encounter (Signed)
Yes, that is fine.  Please clarify that this is not a referral to Hospice.

## 2017-04-05 NOTE — Telephone Encounter (Signed)
Hospice stated Palliative care.

## 2017-04-06 ENCOUNTER — Ambulatory Visit (INDEPENDENT_AMBULATORY_CARE_PROVIDER_SITE_OTHER): Payer: Medicare Other | Admitting: Internal Medicine

## 2017-04-06 ENCOUNTER — Encounter: Payer: Self-pay | Admitting: Internal Medicine

## 2017-04-06 VITALS — BP 158/76 | HR 72 | Temp 97.5°F | Ht 67.32 in | Wt 229.5 lb

## 2017-04-06 DIAGNOSIS — I10 Essential (primary) hypertension: Secondary | ICD-10-CM | POA: Diagnosis not present

## 2017-04-06 DIAGNOSIS — G4700 Insomnia, unspecified: Secondary | ICD-10-CM | POA: Insufficient documentation

## 2017-04-06 DIAGNOSIS — R451 Restlessness and agitation: Secondary | ICD-10-CM | POA: Diagnosis not present

## 2017-04-06 DIAGNOSIS — N3281 Overactive bladder: Secondary | ICD-10-CM | POA: Diagnosis not present

## 2017-04-06 DIAGNOSIS — C3492 Malignant neoplasm of unspecified part of left bronchus or lung: Secondary | ICD-10-CM

## 2017-04-06 DIAGNOSIS — C3490 Malignant neoplasm of unspecified part of unspecified bronchus or lung: Secondary | ICD-10-CM

## 2017-04-06 MED ORDER — DOXYCYCLINE HYCLATE 100 MG PO TABS: 100.0000 mg | ORAL_TABLET | Freq: Two times a day (BID) | ORAL | 0 refills | Status: DC

## 2017-04-06 MED ORDER — LORAZEPAM 0.5 MG PO TABS: ORAL_TABLET | ORAL | 0 refills | Status: DC

## 2017-04-06 MED ORDER — OXYBUTYNIN CHLORIDE ER 5 MG PO TB24: 5.0000 mg | ORAL_TABLET | Freq: Every day | ORAL | 1 refills | Status: AC

## 2017-04-06 MED ORDER — ALBUTEROL SULFATE HFA 108 (90 BASE) MCG/ACT IN AERS: 1.0000 | INHALATION_SPRAY | Freq: Four times a day (QID) | RESPIRATORY_TRACT | 2 refills | Status: AC | PRN

## 2017-04-06 NOTE — Patient Instructions (Addendum)
Follow up in 3 weeks sooner if needed Ask the pharmacist how to use inhaler  Lung Cancer Lung cancer occurs when abnormal cells in the lung grow out of control and form a mass (tumor). There are several types of lung cancer. The two most common types are:  Non-small cell. In this type of lung cancer, abnormal cells are larger and grow more slowly than those of small cell lung cancer.  Small cell. In this type of lung cancer, abnormal cells are smaller than those of non-small cell lung cancer. Small cell lung cancer gets worse faster than non-small cell lung cancer.  What are the causes? The leading cause of lung cancer is smoking tobacco. The second leading cause is radon exposure. What increases the risk?  Smoking tobacco.  Exposure to secondhand tobacco smoke.  Exposure to radon gas.  Exposure to asbestos.  Exposure to arsenic in drinking water.  Air pollution.  Family or personal history of lung cancer.  Lung radiation therapy.  Being older than 76 years. What are the signs or symptoms? In the early stages, symptoms may not be present. As the cancer progresses, symptoms may include:  A lasting cough, possibly with blood.  Fatigue.  Unexplained weight loss.  Shortness of breath.  Wheezing.  Chest pain.  Loss of appetite.  Symptoms of advanced lung cancer include:  Hoarseness.  Bone or joint pain.  Weakness.  Nail problems.  Face or arm swelling.  Paralysis of the face.  Drooping eyelids.  How is this diagnosed? Lung cancer can be identified with a physical exam and with tests such as:  A chest X-ray.  A CT scan.  Blood tests.  A biopsy.  After a diagnosis is made, you will have more tests to determine the stage of the cancer. The stages of non-small cell lung cancer are:  Stage 0, also called carcinoma in situ. At this stage, abnormal cells are found in the inner lining of your lung or lungs.  Stage I. At this stage, abnormal cells have  grown into a tumor that is no larger than 5 cm across. The cancer has entered the deeper lung tissue but has not yet entered the lymph nodes or other parts of the body.  Stage II. At this stage, the tumor is 7 cm across or smaller and has entered nearby lymph nodes. Or, the tumor is 5 cm across or smaller and has invaded surrounding tissue but is not found in nearby lymph nodes. There may be more than one tumor present.  Stage III. At this stage, the tumor may be any size. There may be more than one tumor in the lungs. The cancer cells have spread to the lymph nodes and possibly to other organs.  Stage IV. At this stage, there are tumors in both lungs and the cancer has spread to other areas of the body.  The stages of small cell lung cancer are:  Limited. At this stage, the cancer is found only on one side of the chest.  Extensive. At this stage, the cancer is in the lungs and in tissues on the other side of the chest. The cancer has spread to other organs or is found in the fluid between the layers of your lungs.  How is this treated? Depending on the type and stage of your lung cancer, you may be treated with:  Surgery. This is done to remove a tumor.  Radiation therapy. This treatment destroys cancer cells using X-rays or other types of  radiation.  Chemotherapy. This treatment uses medicines to destroy cancer cells.  Targeted therapy. This treatment aims to destroy only cancer cells instead of all cells as other therapies do.  You may also have a combination of treatments. Follow these instructions at home:  Do not use any tobacco products. This includes cigarettes, chewing tobacco, and electronic cigarettes. If you need help quitting, ask your health care provider.  Take medicines only as directed by your health care provider.  Eat a healthy diet. Work with a dietitian to make sure you are getting the nutrition you need.  Consider joining a support group or seeking counseling  to help you cope with the stress of having lung cancer.  Let your cancer specialist (oncologist) know if you are admitted to the hospital.  Keep all follow-up visits as directed by your health care provider. This is important. Contact a health care provider if:  You lose weight without trying.  You have a persistent cough and wheezing.  You feel short of breath.  You tire easily.  You experience bone or joint pain.  You have difficulty swallowing.  You feel hoarse or notice your voice changing.  Your pain medicine is not helping. Get help right away if:  You cough up blood.  You have new breathing problems.  You develop chest pain.  You develop swelling in: ? One or both ankles or legs. ? Your face, neck, or arms.  You are confused.  You experience paralysis in your face or a drooping eyelid. This information is not intended to replace advice given to you by your health care provider. Make sure you discuss any questions you have with your health care provider. Document Released: 07/10/2000 Document Revised: 09/09/2015 Document Reviewed: 08/07/2013 Elsevier Interactive Patient Education  2018 [place].  Cough, Adult A cough helps to clear your throat and lungs. A cough may last only 2-3 weeks (acute), or it may last longer than 8 weeks (chronic). Many different things can cause a cough. A cough may be a sign of an illness or another medical condition. Follow these instructions at home:  Pay attention to any changes in your cough.  Take medicines only as told by your doctor. ? If you were prescribed an antibiotic medicine, take it as told by your doctor. Do not stop taking it even if you start to feel better. ? Talk with your doctor before you try using a cough medicine.  Drink enough fluid to keep your pee (urine) clear or pale yellow.  If the air is dry, use a cold steam vaporizer or humidifier in your home.  Stay away from things that make you cough at  work or at home.  If your cough is worse at night, try using extra pillows to raise your head up higher while you sleep.  Do not smoke, and try not to be around smoke. If you need help quitting, ask your doctor.  Do not have caffeine.  Do not drink alcohol.  Rest as needed. Contact a doctor if:  You have new problems (symptoms).  You cough up yellow fluid (pus).  Your cough does not get better after 2-3 weeks, or your cough gets worse.  Medicine does not help your cough and you are not sleeping well.  You have pain that gets worse or pain that is not helped with medicine.  You have a fever.  You are losing weight and you do not know why.  You have night sweats. Get help right  away if:  You cough up blood.  You have trouble breathing.  Your heartbeat is very fast. This information is not intended to replace advice given to you by your health care provider. Make sure you discuss any questions you have with your health care provider. Document Released: 12/15/2010 Document Revised: 09/09/2015 Document Reviewed: 06/10/2014 Elsevier Interactive Patient Education  [employer].

## 2017-04-06 NOTE — Telephone Encounter (Signed)
Referral has been faxed.

## 2017-04-06 NOTE — Progress Notes (Signed)
Chief Complaint  Patient presents with  . Hospitalization Follow-up   HFU with daughter and grandson  1. 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 He is on Decadron.  He has pending appt for port insertion 04/18/17 and follows up with H/O [facility] Dr. Grayland Ormond. Hospice care was discussed with H/O and pt declined and there is pending Palliative Care consult.   2. Daughter reports SOB with exertion. He does not have any inhalers to use at home and he also recently had a cough with yellow sputum and he has been excessively drooling which occurred previously but started again  3. He c/o overactive bladder  4. Daughter c/o agitation pt took Zyprexa in the hosp. And she thinks this made him fall but since being on decadron he has been irritable she says.     Review of Systems  Constitutional: Negative for fever.  HENT: Positive for hearing loss.   Respiratory: Positive for sputum production and shortness of breath.   Cardiovascular: Positive for leg swelling. Negative for chest pain.  Gastrointestinal: Positive for constipation and diarrhea.  Genitourinary:       +overactive bladder   Musculoskeletal: Positive for falls.  Skin: Negative for rash.  Neurological: Negative for headaches.  Psychiatric/Behavioral:       +agitation    Past Medical History:  Diagnosis Date  . Complication of anesthesia    has never had surgery  . Dementia 2018  . Hyperlipidemia   . Hypertension   . Smoker   . Stroke [facility])    just happened in early February   Past Surgical History:  Procedure Laterality Date  . ENDARTERECTOMY Right 06/18/2015   Procedure: ENDARTERECTOMY RIGHT CAROTID;  Surgeon: Rosetta Posner, MD;  Location: [place];  Service: Vascular;  Laterality: Right;  . PATCH ANGIOPLASTY Right 06/18/2015   Procedure: PATCH ANGIOPLASTY RIGHT CAROTID ARTERY;  Surgeon: Rosetta Posner, MD;  Location: [facility] OR;  Service: Vascular;  Laterality: Right;   Family History  Problem  Relation Age of Onset  . Diabetes Mother   . Cancer Father        Unknown of the type of cancer   . Diabetes Sister   . ALS Brother    Social History   Socioeconomic History  . Marital status: Divorced    Spouse name: Not on file  . Number of children: Not on file  . Years of education: Not on file  . Highest education level: Not on file  Social Needs  . Financial resource strain: Not on file  . Food insecurity - worry: Not on file  . Food insecurity - inability: Not on file  . Transportation needs - medical: Not on file  . Transportation needs - non-medical: Not on file  Occupational History  . Not on file  Tobacco Use  . Smoking status: Former Smoker    Packs/day: 3.00    Years: 50.00    Pack years: 150.00    Last attempt to quit: 05/28/2015    Years since quitting: 1.8  . Smokeless tobacco: Never Used  Substance and Sexual Activity  . Alcohol use: No  . Drug use: No  . Sexual activity: Not Currently  Other Topics Concern  . Not on file  Social History Narrative   Lives with son in [place]. Single, divorced. No pets in home.      Work - Retired Dealer.      Diet - regular  Exercise - limited.   Current Meds  Medication Sig  . acetaminophen (TYLENOL) 650 MG CR tablet Take 650 mg by mouth every 8 (eight) hours as needed for pain.  Marland Kitchen acetaminophen (TYLENOL) 650 MG suppository Place 650 mg rectally every 4 (four) hours as needed.  Marland Kitchen amLODipine (NORVASC) 10 MG tablet Take 1 tablet (10 mg total) by mouth daily.  Marland Kitchen aspirin EC 81 MG tablet Take 81 mg by mouth daily.  Marland Kitchen atorvastatin (LIPITOR) 80 MG tablet Take 1 tablet (80 mg total) by mouth daily at 6 PM.  . dexamethasone (DECADRON) 6 MG tablet Take 6 mg by mouth 2 (two) times daily.  . folic acid (FOLVITE) 1 MG tablet Take 1 mg by mouth daily.  Marland Kitchen OLANZapine (ZYPREXA) 5 MG tablet Take 5 mg by mouth at bedtime.  . pantoprazole (PROTONIX) 40 MG tablet Take 40 mg by mouth daily.  . traZODone (DESYREL) 50 MG  tablet Take 50 mg by mouth at bedtime as needed for sleep.    No Known Allergies Recent Results (from the past 2160 hour(s))  Basic metabolic panel     Status: Abnormal   Collection Time: 03/21/17 10:19 AM  Result Value Ref Range   Sodium 139 135 - 145 mmol/L   Potassium 3.7 3.5 - 5.1 mmol/L   Chloride 107 101 - 111 mmol/L   CO2 23 22 - 32 mmol/L   Glucose, Bld 135 (H) 65 - 99 mg/dL   BUN 19 6 - 20 mg/dL   Creatinine, Ser 1.07 0.61 - 1.24 mg/dL   Calcium 9.3 8.9 - 10.3 mg/dL   GFR calc non Af Amer >60 >60 mL/min   GFR calc Af Amer >60 >60 mL/min    Comment: (NOTE) The eGFR has been calculated using the CKD EPI equation. This calculation has not been validated in all clinical situations. eGFR's persistently <60 mL/min signify possible Chronic Kidney Disease.    Anion gap 9 5 - 15  CBC     Status: None   Collection Time: 03/21/17 10:19 AM  Result Value Ref Range   WBC 6.1 3.8 - 10.6 K/uL   RBC 5.01 4.40 - 5.90 MIL/uL   Hemoglobin 14.3 13.0 - 18.0 g/dL   HCT 43.7 40.0 - 52.0 %   MCV 87.2 80.0 - 100.0 fL   MCH 28.6 26.0 - 34.0 pg   MCHC 32.8 32.0 - 36.0 g/dL   RDW 13.6 11.5 - 14.5 %   Platelets 223 150 - 440 K/uL  Urinalysis, Complete w Microscopic     Status: Abnormal   Collection Time: 03/21/17 11:50 AM  Result Value Ref Range   Color, Urine YELLOW (A) YELLOW   APPearance CLEAR (A) CLEAR   Specific Gravity, Urine 1.014 1.005 - 1.030   pH 6.0 5.0 - 8.0   Glucose, UA NEGATIVE NEGATIVE mg/dL   Hgb urine dipstick SMALL (A) NEGATIVE   Bilirubin Urine NEGATIVE NEGATIVE   Ketones, ur NEGATIVE NEGATIVE mg/dL   Protein, ur 30 (A) NEGATIVE mg/dL   Nitrite NEGATIVE NEGATIVE   Leukocytes, UA NEGATIVE NEGATIVE   RBC / HPF 6-30 0 - 5 RBC/hpf   WBC, UA 0-5 0 - 5 WBC/hpf   Bacteria, UA NONE SEEN NONE SEEN   Squamous Epithelial / LPF 0-5 (A) NONE SEEN   Mucus PRESENT   Comprehensive metabolic panel     Status: Abnormal   Collection Time: 03/30/17  9:26 AM  Result Value Ref Range    Sodium 138 136 - 145  mEq/L   Potassium 4.4 3.5 - 5.1 mEq/L   Chloride 109 98 - 109 mEq/L   CO2 20 (L) 22 - 29 mEq/L   Glucose 83 70 - 140 mg/dl    Comment: Glucose reference range is for nonfasting patients. Fasting glucose reference range is 70- 100.   BUN 33.6 (H) 7.0 - 26.0 mg/dL   Creatinine 0.9 0.7 - 1.3 mg/dL   Total Bilirubin 0.32 0.20 - 1.20 mg/dL   Alkaline Phosphatase 65 40 - 150 U/L   AST 17 5 - 34 U/L   ALT 30 0 - 55 U/L   Total Protein 6.3 (L) 6.4 - 8.3 g/dL   Albumin 3.6 3.5 - 5.0 g/dL   Calcium 8.5 8.4 - 10.4 mg/dL   Anion Gap 9 3 - 11 mEq/L   EGFR >60 >60 ml/min/1.73 m2    Comment: eGFR is calculated using the CKD-EPI Creatinine Equation (2009)  CBC with Differential     Status: Abnormal   Collection Time: 03/30/17  9:26 AM  Result Value Ref Range   WBC 9.9 4.0 - 10.3 10e3/uL   NEUT# 7.7 (H) 1.5 - 6.5 10e3/uL   HGB 13.0 13.0 - 17.1 g/dL   HCT 39.9 38.4 - 49.9 %   Platelets 234 140 - 400 10e3/uL   MCV 88.5 79.3 - 98.0 fL   MCH 28.8 27.2 - 33.4 pg   MCHC 32.6 32.0 - 36.0 g/dL   RBC 4.51 4.20 - 5.82 10e6/uL   RDW 13.7 11.0 - 14.6 %   lymph# 1.1 0.9 - 3.3 10e3/uL   MONO# 1.1 (H) 0.1 - 0.9 10e3/uL   Eosinophils Absolute 0.0 0.0 - 0.5 10e3/uL   Basophils Absolute 0.0 0.0 - 0.1 10e3/uL   NEUT% 77.4 (H) 39.0 - 75.0 %   LYMPH% 11.2 (L) 14.0 - 49.0 %   MONO% 11.4 0.0 - 14.0 %   EOS% 0.0 0.0 - 7.0 %   BASO% 0.0 0.0 - 2.0 %  Glucose, capillary     Status: Abnormal   Collection Time: 04/03/17 12:11 PM  Result Value Ref Range   Glucose-Capillary 113 (H) 65 - 99 mg/dL   Objective  Body mass index is 35.6 kg/m. Wt Readings from Last 3 Encounters:  04/06/17 229 lb 8 oz (104.1 kg)  04/04/17 231 lb 8 oz (105 kg)  03/30/17 226 lb 6.4 oz (102.7 kg)   Temp Readings from Last 3 Encounters:  04/06/17 (!) 97.5 F (36.4 C) (Oral)  04/04/17 (!) 95.7 F (35.4 C) (Tympanic)  03/30/17 97.7 F (36.5 C) (Oral)   BP Readings from Last 3 Encounters:  04/06/17 (!) 158/76   04/04/17 (!) 162/72  03/30/17 (!) 122/96   Pulse Readings from Last 3 Encounters:  04/06/17 72  04/04/17 82  03/30/17 67   O2 room air 96% Physical Exam  Constitutional: He is oriented to person, place, and time and well-developed, well-nourished, and in no distress.  HENT:  Head: Normocephalic and atraumatic.  Mouth/Throat: Oropharynx is clear and moist and mucous membranes are normal.  Eyes: Conjunctivae are normal. Pupils are equal, round, and reactive to light.  Cardiovascular: Normal rate and regular rhythm.  1+ to 2+leg edema b/l   Pulmonary/Chest: Effort normal. He has wheezes.  Neurological: He is alert and oriented to person, place, and time. Gait normal. Gait normal.  CN 2-12 grossly intact  Moving all 4 ext Drooling on exam slightly per pt since stroke  Skin: Skin is warm and dry.  Psychiatric: His  mood appears anxious. He is agitated. He expresses impulsivity. He exhibits abnormal recent memory.  Nursing note and vitals reviewed.   Assessment   1. Stage IV Metastatic small cell lung cancer with brain mets dx'ed 03/2017  2. New cough and wheezing likely related to #1  3. Overactive bladder  4. Agitation/insomnia 5. HTN  Plan  1.  F/u H/O Dr. Grayland Ormond disc hospice pt declined and palliative consult was placed outpatient per note. Have pt and f/u on consultation of palliative care outpt referral at H/O appt Monday  2.  Rx Doxycycline bid x 1 week  OTC cough meds  Rx Albuterol inhaler prn  3. Trial Oxybutynin 5 ml XL 4.  Ok to stop Zyprexa  Ed Ativan has fall risk too but pt very agitated on exam  Rx Ativan 0.5 mg qhs to bid prn If this dose too high may try to cut in 1/2 though pill is not scored. 5. Assess at f/u in 3 weeks  Pt very agitated today per daughter had Norvasc 10.    Provider: Dr. Olivia Mackie McLean-Scocuzza-Internal Medicine

## 2017-04-08 ENCOUNTER — Other Ambulatory Visit: Payer: Self-pay | Admitting: Oncology

## 2017-04-08 DIAGNOSIS — C3490 Malignant neoplasm of unspecified part of unspecified bronchus or lung: Secondary | ICD-10-CM

## 2017-04-08 MED ORDER — LIDOCAINE-PRILOCAINE 2.5-2.5 % EX CREA: TOPICAL_CREAM | CUTANEOUS | 3 refills | Status: DC

## 2017-04-08 MED ORDER — ONDANSETRON HCL 8 MG PO TABS: 8.0000 mg | ORAL_TABLET | Freq: Two times a day (BID) | ORAL | 2 refills | Status: DC | PRN

## 2017-04-08 MED ORDER — PROCHLORPERAZINE MALEATE 10 MG PO TABS: 10.0000 mg | ORAL_TABLET | Freq: Four times a day (QID) | ORAL | 2 refills | Status: DC | PRN

## 2017-04-08 NOTE — Progress Notes (Signed)
START ON PATHWAY REGIMEN - Small Cell Lung     A cycle is every 21 days:     Etoposide      Carboplatin   **Always confirm dose/schedule in your pharmacy ordering system**    Patient Characteristics: Extensive Stage, First Line Stage Classification: Extensive AJCC T Category: T1c AJCC N Category: N0 AJCC M Category: M1c AJCC 8 Stage Grouping: IVB Line of therapy: First Line Would you be surprised if this patient died  in the next year<= I would NOT be surprised if this patient died in the next year Intent of Therapy: Non-Curative / Palliative Intent, Discussed with Patient

## 2017-04-09 ENCOUNTER — Ambulatory Visit: Payer: Medicare Other | Attending: Radiation Oncology | Admitting: Radiation Oncology

## 2017-04-10 MED ORDER — CEFAZOLIN SODIUM-DEXTROSE 2-4 GM/100ML-% IV SOLN
2.0000 g | Freq: Once | INTRAVENOUS | Status: AC
Start: 2017-04-10 — End: 2017-04-13
  Administered 2017-04-13: 2 g via INTRAVENOUS

## 2017-04-11 ENCOUNTER — Encounter: Payer: Self-pay | Admitting: Anesthesiology

## 2017-04-13 ENCOUNTER — Encounter
Admission: RE | Disposition: A | Discharge status: Home / Self Care | Payer: Self-pay | Source: Ambulatory Visit | Attending: Vascular Surgery

## 2017-04-13 ENCOUNTER — Ambulatory Visit
Admission: RE | Admit: 2017-04-13 | Discharge: 2017-04-13 | Disposition: A | Discharge status: Home / Self Care | Payer: Medicare Other | Source: Ambulatory Visit | Attending: Vascular Surgery | Admitting: Vascular Surgery

## 2017-04-13 ENCOUNTER — Encounter: Payer: Self-pay | Admitting: *Deleted

## 2017-04-13 DIAGNOSIS — Z7902 Long term (current) use of antithrombotics/antiplatelets: Secondary | ICD-10-CM | POA: Insufficient documentation

## 2017-04-13 DIAGNOSIS — C3490 Malignant neoplasm of unspecified part of unspecified bronchus or lung: Secondary | ICD-10-CM

## 2017-04-13 DIAGNOSIS — Z79899 Other long term (current) drug therapy: Secondary | ICD-10-CM | POA: Insufficient documentation

## 2017-04-13 DIAGNOSIS — Z87891 Personal history of nicotine dependence: Secondary | ICD-10-CM

## 2017-04-13 DIAGNOSIS — Z7982 Long term (current) use of aspirin: Secondary | ICD-10-CM

## 2017-04-13 DIAGNOSIS — C7931 Secondary malignant neoplasm of brain: Secondary | ICD-10-CM | POA: Insufficient documentation

## 2017-04-13 HISTORY — PX: PORTA CATH INSERTION: CATH118285

## 2017-04-13 SURGERY — PORTA CATH INSERTION
Anesthesia: Moderate Sedation

## 2017-04-13 MED ORDER — FENTANYL CITRATE (PF) 100 MCG/2ML IJ SOLN
INTRAMUSCULAR | Status: AC
  Filled 2017-04-13: qty 2

## 2017-04-13 MED ORDER — SODIUM CHLORIDE 0.9 % IV SOLN
INTRAVENOUS | Status: DC
  Administered 2017-04-13: 12:00:00 via INTRAVENOUS

## 2017-04-13 MED ORDER — MIDAZOLAM HCL 2 MG/ML PO SYRP
ORAL_SOLUTION | ORAL | Status: AC
  Filled 2017-04-13: qty 4

## 2017-04-13 MED ORDER — MIDAZOLAM HCL 2 MG/ML PO SYRP
8.0000 mg | ORAL_SOLUTION | Freq: Once | ORAL | Status: AC
  Administered 2017-04-13: 8 mg via ORAL

## 2017-04-13 MED ORDER — MIDAZOLAM HCL 2 MG/2ML IJ SOLN
INTRAMUSCULAR | Status: DC | PRN
  Administered 2017-04-13 (×3): 1 mg via INTRAVENOUS

## 2017-04-13 MED ORDER — SODIUM CHLORIDE 0.9 % IR SOLN: Freq: Once | Status: DC

## 2017-04-13 MED ORDER — ONDANSETRON HCL 4 MG/2ML IJ SOLN: 4.0000 mg | Freq: Four times a day (QID) | INTRAMUSCULAR | Status: DC | PRN

## 2017-04-13 MED ORDER — FENTANYL CITRATE (PF) 100 MCG/2ML IJ SOLN
INTRAMUSCULAR | Status: DC | PRN
  Administered 2017-04-13 (×2): 25 ug via INTRAVENOUS
  Administered 2017-04-13: 50 ug via INTRAVENOUS
  Administered 2017-04-13: 25 ug via INTRAVENOUS
  Administered 2017-04-13: 50 ug via INTRAVENOUS

## 2017-04-13 MED ORDER — HYDROMORPHONE HCL 1 MG/ML IJ SOLN: 1.0000 mg | Freq: Once | INTRAMUSCULAR | Status: DC | PRN

## 2017-04-13 MED ORDER — LIDOCAINE-EPINEPHRINE (PF) 1 %-1:200000 IJ SOLN
INTRAMUSCULAR | Status: AC
  Filled 2017-04-13: qty 30

## 2017-04-13 MED ORDER — HEPARIN (PORCINE) IN NACL 2-0.9 UNIT/ML-% IJ SOLN
INTRAMUSCULAR | Status: AC
  Filled 2017-04-13: qty 500

## 2017-04-13 MED ORDER — MIDAZOLAM HCL 5 MG/5ML IJ SOLN
INTRAMUSCULAR | Status: AC
  Filled 2017-04-13: qty 5

## 2017-04-13 MED ORDER — CEFAZOLIN SODIUM-DEXTROSE 2-4 GM/100ML-% IV SOLN
INTRAVENOUS | Status: AC
  Filled 2017-04-13: qty 100

## 2017-04-13 SURGICAL SUPPLY — 8 items
DRAPE INCISE IOBAN 66X45 STRL (DRAPES) ×3 IMPLANT
KIT PORT POWER 8FR ISP CVUE (Miscellaneous) ×3 IMPLANT
NEEDLE ENTRY 21GA 7CM ECHOTIP (NEEDLE) ×3 IMPLANT
PACK ANGIOGRAPHY (CUSTOM PROCEDURE TRAY) ×3 IMPLANT
SET INTRO CAPELLA COAXIAL (SET/KITS/TRAYS/PACK) ×3 IMPLANT
SUT MNCRL AB 4-0 PS2 18 (SUTURE) ×3 IMPLANT
SUT PROLENE 0 CT 1 30 (SUTURE) ×3 IMPLANT
SUTURE VIC 3-0 (SUTURE) ×3 IMPLANT

## 2017-04-13 NOTE — H&P (Signed)
[facility] History & Physical Update  The patient was interviewed and re-examined.  The patient's previous History and Physical has been reviewed and is unchanged.  The patient has stage IV small cell lung cancer with brain metastases and is undergoing palliative treatment.  He therefore requires appropriate IV access.  There is no change in the plan of care. We plan to proceed with the scheduled procedure.  Hortencia Pilar, MD  04/13/2017, 12:52 PM

## 2017-04-13 NOTE — Op Note (Signed)
OPERATIVE NOTE   PROCEDURE: 1. Placement of a right IJ Infuse-a-Port  PRE-OPERATIVE DIAGNOSIS: Metastatic small cell lung carcinoma  POST-OPERATIVE DIAGNOSIS: Same  SURGEON: Katha Cabal M.D.  ANESTHESIA: Conscious sedation was administered under my direct supervision by the interventional radiology RN. IV Versed plus fentanyl were utilized. Continuous ECG, pulse oximetry and blood pressure was monitored throughout the entire procedure. Conscious sedation was for a total of 26 minutes.  ESTIMATED BLOOD LOSS: Minimal   FINDING(S): 1.  Patent vein  SPECIMEN(S): None  INDICATIONS:   Jonathan Bowen is a 71 y.o. male who presents with metastatic small cell carcinoma of the lung.  He will be undergoing palliative chemotherapy and therefore requires appropriate IV access.  The risks and benefits of a port have been reviewed all questions answered patient agrees to proceed.  DESCRIPTION: After obtaining full informed written consent, the patient was brought back to the special procedure suite and placed in the supine position. The patient's right neck and chest wall are prepped and draped in sterile fashion. Appropriate timeout was called.  Ultrasound is placed in a sterile sleeve, ultrasound is utilized to avoid vascular injury as well as secondary to lack of appropriate landmarks. The right internal jugular vein is identified. It is echolucent and homogeneous as well as easily compressible indicating patency. An image is recorded for the permanent record.  Access to the vein with a micropuncture needle is done under direct ultrasound visualization.  1% lidocaine is infiltrated into the soft tissue at the base of the neck as well as on the chest wall.  Under direct ultrasound visualization a micro-needle is inserted into the vein followed by the micro-wire. Micro-sheath was then advanced and a J wire is inserted without difficulty under fluoroscopic guidance. A small counterincision was  created at the wire insertion site. A transverse incision is created 2 fingerbreadths below the scapula and a pocket is fashioned using both blunt and sharp dissection. The pocket is tested for appropriate size with the hub of the Infuse-a-Port. The tunneling device is then used to pull the intravascular portion of the catheter from the pocket to the neck counterincision.  Dilator and peel-away sheath were then inserted over the wire and the wire is removed. Catheter is then advanced into the venous system without difficulty. Peel-away sheath was then removed.  Catheter is then positioned under fluoroscopic guidance at the atrial caval junction. It is then transected connected to the hub and the hope is slipped into the subcutaneous pocket on the chest wall. The hub was then accessed percutaneously and aspirates easily and flushes well and is flushed with 30 cc of heparinized saline. The pocket incision is then closed in layers using interrupted 3-0 Vicryl for the subcutaneous tissues and 4-0 Monocryl subcuticular for skin closure. Dermabond is applied. The neck counterincision was closed with 4-0 Monocryl subcuticular and Dermabond as well.  The patient tolerated the procedure well and there were no immediate complications.  COMPLICATIONS: None  CONDITION: Unchanged  Katha Cabal M.D. [facility] Office: [phone]   04/13/2017, 1:47 PM

## 2017-04-13 NOTE — Discharge Instructions (Signed)
Moderate Conscious Sedation, Adult, Care After These instructions provide you with information about caring for yourself after your procedure. Your health care provider may also give you more specific instructions. Your treatment has been planned according to current medical practices, but problems sometimes occur. Call your health care provider if you have any problems or questions after your procedure. What can I expect after the procedure? After your procedure, it is common:  To feel sleepy for several hours.  To feel clumsy and have poor balance for several hours.  To have poor judgment for several hours.  To vomit if you eat too soon.  Follow these instructions at home: For at least 24 hours after the procedure:   Do not: ? Participate in activities where you could fall or become injured. ? Drive. ? Use heavy machinery. ? Drink alcohol. ? Take sleeping pills or medicines that cause drowsiness. ? Make important decisions or sign legal documents. ? Take care of children on your own.  Rest. Eating and drinking  Follow the diet recommended by your health care provider.  If you vomit: ? Drink water, juice, or soup when you can drink without vomiting. ? Make sure you have little or no nausea before eating solid foods. General instructions  Have a responsible adult stay with you until you are awake and alert.  Take over-the-counter and prescription medicines only as told by your health care provider.  If you smoke, do not smoke without supervision.  Keep all follow-up visits as told by your health care provider. This is important. Contact a health care provider if:  You keep feeling nauseous or you keep vomiting.  You feel light-headed.  You develop a rash.  You have a fever. Get help right away if:  You have trouble breathing. This information is not intended to replace advice given to you by your health care provider. Make sure you discuss any questions you have  with your health care provider. Document Released: 01/22/2013 Document Revised: 09/06/2015 Document Reviewed: 07/24/2015 Elsevier Interactive Patient Education  2018 [employer] Insertion, Care After This sheet gives you information about how to care for yourself after your procedure. Your health care provider may also give you more specific instructions. If you have problems or questions, contact your health care provider. What can I expect after the procedure? After your procedure, it is common to have:  Discomfort at the port insertion site.  Bruising on the skin over the port. This should improve over 3-4 days.  Follow these instructions at home: [facility] care  After your port is placed, you will get a manufacturer's information card. The card has information about your port. Keep this card with you at all times.  Take care of the port as told by your health care provider. Ask your health care provider if you or a family member can get training for taking care of the port at home. A home health care nurse may also take care of the port.  Make sure to remember what type of port you have. Incision care  Follow instructions from your health care provider about how to take care of your port insertion site. Make sure you: ? Wash your hands with soap and water before you change your bandage (dressing). If soap and water are not available, use hand sanitizer. ? Change your dressing as told by your health care provider. ? Leave stitches (sutures), skin glue, or adhesive strips in place. These skin closures may need to stay  in place for 2 weeks or longer. If adhesive strip edges start to loosen and curl up, you may trim the loose edges. Do not remove adhesive strips completely unless your health care provider tells you to do that.  Check your port insertion site every day for signs of infection. Check for: ? More redness, swelling, or pain. ? More fluid or  blood. ? Warmth. ? Pus or a bad smell. General instructions  Do not take baths, swim, or use a hot tub until your health care provider approves.  Do not lift anything that is heavier than 10 lb (4.5 kg) for a week, or as told by your health care provider.  Ask your health care provider when it is okay to: ? Return to work or school. ? Resume usual physical activities or sports.  Do not drive for 24 hours if you were given a medicine to help you relax (sedative).  Take over-the-counter and prescription medicines only as told by your health care provider.  Wear a medical alert bracelet in case of an emergency. This will tell any health care providers that you have a port.  Keep all follow-up visits as told by your health care provider. This is important. Contact a health care provider if:  You cannot flush your port with saline as directed, or you cannot draw blood from the port.  You have a fever or chills.  You have more redness, swelling, or pain around your port insertion site.  You have more fluid or blood coming from your port insertion site.  Your port insertion site feels warm to the touch.  You have pus or a bad smell coming from the port insertion site. Get help right away if:  You have chest pain or shortness of breath.  You have bleeding from your port that you cannot control. Summary  Take care of the port as told by your health care provider.  Change your dressing as told by your health care provider.  Keep all follow-up visits as told by your health care provider. This information is not intended to replace advice given to you by your health care provider. Make sure you discuss any questions you have with your health care provider. Document Released: 01/22/2013 Document Revised: 02/23/2016 Document Reviewed: 02/23/2016 Elsevier Interactive Patient Education  2017 [employer].

## 2017-04-14 ENCOUNTER — Other Ambulatory Visit: Payer: Self-pay

## 2017-04-14 ENCOUNTER — Emergency Department
Admission: EM | Admit: 2017-04-14 | Discharge: 2017-04-14 | Disposition: A | Discharge status: Home / Self Care | Payer: Medicare Other | Source: Home / Self Care | Attending: Emergency Medicine | Admitting: Emergency Medicine

## 2017-04-14 ENCOUNTER — Emergency Department: Payer: Medicare Other

## 2017-04-14 ENCOUNTER — Inpatient Hospital Stay
Admission: EM | Admit: 2017-04-14 | Discharge: 2017-04-18 | DRG: 101 | Disposition: A | Discharge status: Hospice | Payer: Medicare Other | Attending: Internal Medicine | Admitting: Internal Medicine

## 2017-04-14 DIAGNOSIS — Z87891 Personal history of nicotine dependence: Secondary | ICD-10-CM | POA: Insufficient documentation

## 2017-04-14 DIAGNOSIS — Z7982 Long term (current) use of aspirin: Secondary | ICD-10-CM

## 2017-04-14 DIAGNOSIS — C7931 Secondary malignant neoplasm of brain: Secondary | ICD-10-CM | POA: Insufficient documentation

## 2017-04-14 DIAGNOSIS — C3490 Malignant neoplasm of unspecified part of unspecified bronchus or lung: Secondary | ICD-10-CM

## 2017-04-14 DIAGNOSIS — I69351 Hemiplegia and hemiparesis following cerebral infarction affecting right dominant side: Secondary | ICD-10-CM

## 2017-04-14 DIAGNOSIS — I10 Essential (primary) hypertension: Secondary | ICD-10-CM

## 2017-04-14 DIAGNOSIS — Z79899 Other long term (current) drug therapy: Secondary | ICD-10-CM | POA: Insufficient documentation

## 2017-04-14 DIAGNOSIS — F0390 Unspecified dementia without behavioral disturbance: Secondary | ICD-10-CM

## 2017-04-14 DIAGNOSIS — Z7952 Long term (current) use of systemic steroids: Secondary | ICD-10-CM

## 2017-04-14 DIAGNOSIS — E785 Hyperlipidemia, unspecified: Secondary | ICD-10-CM | POA: Diagnosis present

## 2017-04-14 DIAGNOSIS — K219 Gastro-esophageal reflux disease without esophagitis: Secondary | ICD-10-CM | POA: Diagnosis present

## 2017-04-14 DIAGNOSIS — G40409 Other generalized epilepsy and epileptic syndromes, not intractable, without status epilepticus: Principal | ICD-10-CM | POA: Diagnosis present

## 2017-04-14 DIAGNOSIS — Z833 Family history of diabetes mellitus: Secondary | ICD-10-CM

## 2017-04-14 DIAGNOSIS — Z7902 Long term (current) use of antithrombotics/antiplatelets: Secondary | ICD-10-CM

## 2017-04-14 DIAGNOSIS — Z66 Do not resuscitate: Secondary | ICD-10-CM | POA: Diagnosis present

## 2017-04-14 DIAGNOSIS — R112 Nausea with vomiting, unspecified: Secondary | ICD-10-CM

## 2017-04-14 DIAGNOSIS — R55 Syncope and collapse: Secondary | ICD-10-CM

## 2017-04-14 DIAGNOSIS — R519 Headache, unspecified: Secondary | ICD-10-CM

## 2017-04-14 DIAGNOSIS — R51 Headache: Secondary | ICD-10-CM | POA: Insufficient documentation

## 2017-04-14 DIAGNOSIS — R569 Unspecified convulsions: Secondary | ICD-10-CM

## 2017-04-14 DIAGNOSIS — J441 Chronic obstructive pulmonary disease with (acute) exacerbation: Secondary | ICD-10-CM | POA: Diagnosis present

## 2017-04-14 LAB — CBC
HEMATOCRIT: 47.3 % (ref 40.0–52.0)
HEMOGLOBIN: 15.8 g/dL (ref 13.0–18.0)
MCH: 28.9 pg (ref 26.0–34.0)
MCHC: 33.4 g/dL (ref 32.0–36.0)
MCV: 86.5 fL (ref 80.0–100.0)
Platelets: 166 10*3/uL (ref 150–440)
RBC: 5.47 MIL/uL (ref 4.40–5.90)
RDW: 15.5 % — ABNORMAL HIGH (ref 11.5–14.5)
WBC: 16.1 10*3/uL — ABNORMAL HIGH (ref 3.8–10.6)

## 2017-04-14 LAB — CBC WITH DIFFERENTIAL/PLATELET
Basophils Absolute: 0 10*3/uL (ref 0–0.1)
Basophils Relative: 0 %
EOS PCT: 0 %
Eosinophils Absolute: 0 10*3/uL (ref 0–0.7)
HEMATOCRIT: 45.4 % (ref 40.0–52.0)
Hemoglobin: 14.6 g/dL (ref 13.0–18.0)
LYMPHS ABS: 0.5 10*3/uL — AB (ref 1.0–3.6)
Lymphocytes Relative: 4 %
MCH: 28.4 pg (ref 26.0–34.0)
MCHC: 32.1 g/dL (ref 32.0–36.0)
MCV: 88.3 fL (ref 80.0–100.0)
MONO ABS: 0.6 10*3/uL (ref 0.2–1.0)
MONOS PCT: 5 %
NEUTROS ABS: 11.1 10*3/uL — AB (ref 1.4–6.5)
Neutrophils Relative %: 91 %
PLATELETS: 154 10*3/uL (ref 150–440)
RBC: 5.15 MIL/uL (ref 4.40–5.90)
RDW: 15.7 % — AB (ref 11.5–14.5)
WBC: 12.2 10*3/uL — AB (ref 3.8–10.6)

## 2017-04-14 LAB — BASIC METABOLIC PANEL
Anion gap: 7 (ref 5–15)
BUN: 35 mg/dL — AB (ref 6–20)
CHLORIDE: 100 mmol/L — AB (ref 101–111)
CO2: 24 mmol/L (ref 22–32)
CREATININE: 1.16 mg/dL (ref 0.61–1.24)
Calcium: 8.1 mg/dL — ABNORMAL LOW (ref 8.9–10.3)
GFR calc Af Amer: >60 mL/min (ref >60)
GFR calc non Af Amer: >60 mL/min (ref >60)
GLUCOSE: 135 mg/dL — AB (ref 65–99)
POTASSIUM: 4.7 mmol/L (ref 3.5–5.1)
Sodium: 131 mmol/L — ABNORMAL LOW (ref 135–145)

## 2017-04-14 LAB — URINALYSIS, COMPLETE (UACMP) WITH MICROSCOPIC
Bacteria, UA: NONE SEEN
Bilirubin Urine: NEGATIVE
Glucose, UA: 50 mg/dL — AB
Ketones, ur: NEGATIVE mg/dL
LEUKOCYTES UA: NEGATIVE
Nitrite: NEGATIVE
PH: 7 (ref 5.0–8.0)
Protein, ur: 30 mg/dL — AB
SPECIFIC GRAVITY, URINE: 1.013 (ref 1.005–1.030)

## 2017-04-14 LAB — PROTIME-INR
INR: 0.9
Prothrombin Time: 12.1 seconds (ref 11.4–15.2)

## 2017-04-14 MED ORDER — HYDRALAZINE HCL 20 MG/ML IJ SOLN
5.0000 mg | Freq: Once | INTRAMUSCULAR | Status: AC
  Administered 2017-04-14: 5 mg via INTRAVENOUS
  Filled 2017-04-14: qty 1

## 2017-04-14 MED ORDER — LORAZEPAM 2 MG/ML IJ SOLN
4.0000 mg | Freq: Once | INTRAMUSCULAR | Status: AC
  Administered 2017-04-14: 4 mg via INTRAVENOUS

## 2017-04-14 MED ORDER — LORAZEPAM 2 MG/ML IJ SOLN
INTRAMUSCULAR | Status: AC
  Filled 2017-04-14: qty 2

## 2017-04-14 MED ORDER — ACETAMINOPHEN 500 MG PO TABS
1000.0000 mg | ORAL_TABLET | Freq: Once | ORAL | Status: AC
  Administered 2017-04-14: 1000 mg via ORAL
  Filled 2017-04-14: qty 2

## 2017-04-14 MED ORDER — MORPHINE SULFATE (PF) 4 MG/ML IV SOLN
4.0000 mg | Freq: Once | INTRAVENOUS | Status: AC
  Administered 2017-04-14: 4 mg via INTRAVENOUS
  Filled 2017-04-14: qty 1

## 2017-04-14 MED ORDER — SODIUM CHLORIDE 0.9 % IV BOLUS (SEPSIS)
1000.0000 mL | Freq: Once | INTRAVENOUS | Status: AC
  Administered 2017-04-14: 1000 mL via INTRAVENOUS

## 2017-04-14 MED ORDER — METOPROLOL TARTRATE 50 MG PO TABS
50.0000 mg | ORAL_TABLET | Freq: Once | ORAL | Status: AC
  Administered 2017-04-14: 50 mg via ORAL
  Filled 2017-04-14: qty 1

## 2017-04-14 MED ORDER — SODIUM CHLORIDE 0.9 % IV SOLN
1500.0000 mg | Freq: Once | INTRAVENOUS | Status: AC
  Administered 2017-04-15: 1500 mg via INTRAVENOUS
  Filled 2017-04-14: qty 15

## 2017-04-14 MED ORDER — OXYCODONE HCL 5 MG PO TABS
10.0000 mg | ORAL_TABLET | Freq: Once | ORAL | Status: AC
  Administered 2017-04-14: 10 mg via ORAL
  Filled 2017-04-14: qty 2

## 2017-04-14 MED ORDER — ACETAMINOPHEN 325 MG PO TABS
650.0000 mg | ORAL_TABLET | Freq: Once | ORAL | Status: AC
  Administered 2017-04-14: 650 mg via ORAL
  Filled 2017-04-14: qty 2

## 2017-04-14 MED ORDER — ONDANSETRON HCL 4 MG/2ML IJ SOLN
4.0000 mg | Freq: Once | INTRAMUSCULAR | Status: AC
  Administered 2017-04-14: 4 mg via INTRAVENOUS
  Filled 2017-04-14: qty 2

## 2017-04-14 NOTE — Discharge Instructions (Signed)
To the emergency room for any new or worrisome symptoms including worsening headache numbness or weakness.  We do notice blood pressure is quite elevated, please have it checked again closely at home and take home blood pressure medications.  You have declined further care in the emergency department which is certainly her choice, but we do asked that you come back if you feel worse in any way.  Follow-up with oncology first thing on Monday morning.

## 2017-04-14 NOTE — ED Triage Notes (Signed)
Pt seen here earlier today for headache, now back with vomiting and LOC on the way back here. Pt has stage 4 lung cancer with mets, port placed yesterday.

## 2017-04-14 NOTE — ED Provider Notes (Addendum)
[facility] Emergency Department Provider Note  ____________________________________________   I have reviewed the triage vital signs and the nursing notes. Where available I have reviewed prior notes and, if possible and indicated, outside hospital notes.    HISTORY  Chief Complaint Headache    HPI Jonathan Bowen is a 71 y.o. male   density complaining of headache.  He states the headache is nearly gone.  Patient woke up with it.  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  Patient had a port placed yesterday in his right chest.  Hospice apparently has been discussed with the patient but at this time is declined.  Patient does have a history of prior stroke with residual right-sided weakness according to patient.  He is here with a headache which he woke up with.  He does not recall if he had headaches like this before.  He states the headache is nearly gone at this time.  Is at this time a 2 or 3 out of 10.  He does not think he took anything for, there are no alleviating or aggravating factors, he denies any focal numbness or weakness or change in his baseline otherwise.  He denies any head trauma he denies any fever chills or stiff neck, he denies any change in vision or difficulty speaking or walking. Level 5 chart caveat; no further history available due to patient status.  Past Medical History:  Diagnosis Date  . Agitation   . Complication of anesthesia    has never had surgery  . Dementia 2018  . Hyperlipidemia   . Hypertension   . Small cell lung cancer ([place])    with brain mets 03/2017   . Smoker   . Stroke [facility])    just happened in early February  . Urinary incontinence     Patient Active Problem List   Diagnosis Date Noted  . Overactive bladder 04/06/2017  . Agitation  04/06/2017  . Insomnia 04/06/2017  . Small cell lung cancer ([place]) 03/30/2017  . Goals of care, counseling/discussion 03/30/2017  . History of CVA (cerebrovascular accident) 06/29/2015  . Carotid artery stenosis 06/18/2015  . Hyperlipidemia   . Essential hypertension     Past Surgical History:  Procedure Laterality Date  . ENDARTERECTOMY Right 06/18/2015   Procedure: ENDARTERECTOMY RIGHT CAROTID;  Surgeon: Rosetta Posner, MD;  Location: [place];  Service: Vascular;  Laterality: Right;  . PATCH ANGIOPLASTY Right 06/18/2015   Procedure: PATCH ANGIOPLASTY RIGHT CAROTID ARTERY;  Surgeon: Rosetta Posner, MD;  Location: [place];  Service: Vascular;  Laterality: Right;  . PORTA CATH INSERTION N/A 04/13/2017   Procedure: PORTA CATH INSERTION;  Surgeon: Katha Cabal, MD;  Location: [place] CV LAB;  Service: Cardiovascular;  Laterality: N/A;    Prior to Admission medications   Medication Sig Start Date End Date Taking? Authorizing Provider  acetaminophen (TYLENOL) 650 MG CR tablet Take 650 mg by mouth every 8 (eight) hours as needed for pain.    [provider]  acetaminophen (TYLENOL) 650 MG suppository Place 650 mg rectally every 4 (four) hours as needed.    [provider]  albuterol (PROVENTIL HFA;VENTOLIN HFA) 108 (90 Base) MCG/ACT inhaler Inhale 1-2 puffs into the lungs every 6 (six) hours as needed for wheezing or shortness of breath. 04/06/17  McLean-Scocuzza, Nino Glow, MD  amLODipine (NORVASC) 10 MG tablet Take 1 tablet (10 mg total) by mouth daily. 04/07/16   Coral Spikes, DO  aspirin EC 81 MG tablet Take 81 mg by mouth daily.    [provider]  atorvastatin (LIPITOR) 80 MG tablet Take 1 tablet (80 mg total) by mouth daily at 6 PM. 04/07/16   Cook, Michael Boston G, DO  dexamethasone (DECADRON) 6 MG tablet Take 6 mg by mouth 2 (two) times daily.    [provider]  doxycycline (VIBRA-TABS) 100 MG tablet Take 1 tablet (100 mg total) by mouth 2 (two) times  daily. With food Patient not taking: Reported on 04/13/2017 04/06/17   McLean-Scocuzza, Nino Glow, MD  folic acid (FOLVITE) 1 MG tablet Take 1 mg by mouth daily.    [provider]  lidocaine-prilocaine (EMLA) cream Apply to affected area once 04/08/17   Lloyd Huger, MD  LORazepam (ATIVAN) 0.5 MG tablet Qd to bid prn 04/06/17   McLean-Scocuzza, Nino Glow, MD  ondansetron (ZOFRAN) 8 MG tablet Take 1 tablet (8 mg total) by mouth 2 (two) times daily as needed for refractory nausea / vomiting. Patient not taking: Reported on 04/13/2017 04/08/17   Lloyd Huger, MD  oxybutynin (DITROPAN-XL) 5 MG 24 hr tablet Take 1 tablet (5 mg total) by mouth at bedtime. 04/06/17   McLean-Scocuzza, Nino Glow, MD  pantoprazole (PROTONIX) 40 MG tablet Take 40 mg by mouth daily.    [provider]  prochlorperazine (COMPAZINE) 10 MG tablet Take 1 tablet (10 mg total) by mouth every 6 (six) hours as needed (Nausea or vomiting). Patient not taking: Reported on 04/13/2017 04/08/17   Lloyd Huger, MD  traZODone (DESYREL) 50 MG tablet Take 50 mg by mouth at bedtime as needed for sleep.     [provider]    Allergies Patient has no known allergies.  Family History  Problem Relation Age of Onset  . Diabetes Mother   . Cancer Father        Unknown of the type of cancer   . Diabetes Sister   . ALS Brother     Social History Social History   Tobacco Use  . Smoking status: Former Smoker    Packs/day: 3.00    Years: 50.00    Pack years: 150.00    Last attempt to quit: 05/28/2015    Years since quitting: 1.8  . Smokeless tobacco: Never Used  Substance Use Topics  . Alcohol use: No  . Drug use: No    Review of Systems Constitutional: No fever/chills Eyes: No visual changes. ENT: No sore throat. No stiff neck no neck pain Cardiovascular: Denies chest pain. Respiratory: Denies shortness of breath. Gastrointestinal:   no vomiting.  No diarrhea.  No  constipation. Genitourinary: Negative for dysuria. Musculoskeletal: Negative lower extremity swelling Skin: Negative for rash. Neurological: Negative for  focal weakness or numbness.   ____________________________________________   PHYSICAL EXAM:  VITAL SIGNS: ED Triage Vitals  Enc Vitals Group     BP 04/14/17 1330 (!) 210/79     Pulse Rate 04/14/17 1330 68     Resp 04/14/17 1330 (!) 22     Temp 04/14/17 1330 98.8 F (37.1 C)     Temp Source 04/14/17 1330 Oral     SpO2 04/14/17 1330 97 %     Weight 04/14/17 1330 229 lb (103.9 kg)     Height 04/14/17 1330 5\' 7"  (1.702 m)  Head Circumference --      Peak Flow --      Pain Score 04/14/17 1329 10     Pain Loc --      Pain Edu? --      Excl. in [place]? --     Constitutional: Alert and oriented name and place unsure of the exact date. Well appearing and in no acute distress. Eyes: Conjunctivae are normal Head: Atraumatic HEENT: No congestion/rhinnorhea. Mucous membranes are moist.  Oropharynx non-erythematous Neck:   Nontender with no meningismus, no masses, no stridor Cardiovascular: Normal rate, regular rhythm. Grossly normal heart sounds.  Good peripheral circulation. Respiratory: Normal respiratory effort.  No retractions. Lungs CTAB. Abdominal: Soft and nontender. No distention. No guarding no rebound Back:  There is no focal tenderness or step off.  there is no midline tenderness there are no lesions noted. there is no CVA tenderness Musculoskeletal: No lower extremity tenderness, no upper extremity tenderness. No joint effusions, no DVT signs strong distal pulses no edema Neurologic: Nerves appear intact, no gross focal neurologic deficits are   appreciated, possibly slightly less strength on the right than the left but he states this is his baseline, which is normal, Skin:  Skin is warm, dry and intact.  Areas were permacath was placed yesterday do not appear infected Psychiatric: Mood and affect are normal. Speech and  behavior are normal.  ____________________________________________   LABS (all labs ordered are listed, but only abnormal results are displayed)  Labs Reviewed  CBC WITH DIFFERENTIAL/PLATELET  PROTIME-INR  BASIC METABOLIC PANEL    Pertinent labs  results that were available during my care of the patient were reviewed by me and considered in my medical decision making (see chart for details). ____________________________________________  EKG  I personally interpreted any EKGs ordered by me or triage  ____________________________________________  RADIOLOGY  Pertinent labs & imaging results that were available during my care of the patient were reviewed by me and considered in my medical decision making (see chart for details). If possible, patient and/or family made aware of any abnormal findings.  No results found. ____________________________________________    PROCEDURES  Procedure(s) performed: None  Procedures  Critical Care performed: None  ____________________________________________   INITIAL IMPRESSION / ASSESSMENT AND PLAN / ED COURSE  Pertinent labs & imaging results that were available during my care of the patient were reviewed by me and considered in my medical decision making (see chart for details).  Patient here with a headache, no apparent change in neurologic exam headache itself is nearly gone does have a history of metastases to the brain on Decadron, will obtain CT scan of the head make sure that there is been no bleed and we will continue to evaluate the patient.  ,b ====  ----------------------------------------- 3:18 PM on 04/14/2017 -----------------------------------------  Patient's family on our eyes.  Patient is apparently at his baseline.  He is adamant that he does not want to stay in the hospital and he would like to go home.  He does no longer complain of a headache, CT scan shows no acute decompensation or acute bleed after  discussion with Dr. Carlis Abbott of radiology this is a progression of disease he is already taking steroids, not unusual the patient should have headache with significant brain metastases.  He is already had a palliative care consult.  We do note that his blood pressure is elevated.  At this time he is very agitated and upset but is not instantly discharge.  Family feels very  comfortable taking him home I do not think that there is any utility to admission to the hospital as I do not think there is any acute intervention required, patient tends to get very agitated and upset when he is admitted.  He himself is refusing to stay, and I do not think that in his best interest to force him to do so even if we could.  We will therefore discharge him with family.  They will have his blood pressure rechecked.  It does not appear that his blood pressure is causing his headache as his headache is gone and his blood pressure remains elevated.  Does have a history of elevated blood pressure during prior hospitalization it was noted that his had blood pressure remained elevated.  This is not far from baseline for him given his emotional distress of being here it is very difficult to find a blood pressure reading that will give [place] numbers that are more mainline.  In any event, family will follow closely with his blood pressure medications and as an outpatient with his oncologist.  I have paged oncology to have my a call back but the patient and family do not wish to wait.  ----------------------------------------- 3:24 PM on 04/14/2017 -----------------------------------------  Gust with Dr. Susy Manor, who agrees with management and will see patient on Monday.    ____________________________________________   FINAL CLINICAL IMPRESSION(S) / ED DIAGNOSES  Final diagnoses:  None      This chart was dictated using voice recognition software.  Despite best efforts to proofread,  errors can occur which can change meaning.       Schuyler Amor, MD 04/14/17 1340    Schuyler Amor, MD 04/14/17 1520    Schuyler Amor, MD 04/14/17 1520    Schuyler Amor, MD 04/14/17 1525

## 2017-04-14 NOTE — ED Notes (Signed)
Pt ambulatory to wheel chair upon discharge. Verbalized understanding of discharge instructions and follow-up care. A&O. Skin warm and dry.

## 2017-04-14 NOTE — H&P (Signed)
[place] [facility] [place] NAME: Jonathan Bowen    MR#:  [id number]  DATE OF BIRTH:  May 15, 1945  DATE OF ADMISSION:  04/14/2017  PRIMARY CARE PHYSICIAN: [name], MD   REQUESTING/REFERRING PHYSICIAN: Alfred Levins, MD  CHIEF COMPLAINT:   Chief Complaint  Patient presents with  . Loss of Consciousness    HISTORY OF PRESENT ILLNESS:  Jonathan Bowen  is a 71 y.o. male who presents with headache and elevated blood pressure.  Patient was seen here in the ED earlier today with headache and was discharged home with Tylenol.  Family states this did not help him at home and his headache got worse so they brought him back to the ED.  Here he was given oxycodone for his headache with resolution.  Patient has metastatic cancer including metastases to his brain.  His blood pressure remains significantly elevated, though it was responsive to IV medications given here.  Hospitalist were initially contacted for admission for observation given that the family felt they did not have access to medications at home tonight as the patient's headache came back.  However, on this writer's interview of the patient had a fall on tonic-clonic generalized seizure.  Seizure was aborted with IV Ativan, the patient was subsequently loaded with Keppra.  PAST MEDICAL HISTORY:   Past Medical History:  Diagnosis Date  . Agitation   . Complication of anesthesia    has never had surgery  . Dementia 2018  . Hyperlipidemia   . Hypertension   . Small cell lung cancer ([place])    with brain mets 03/2017   . Smoker   . Stroke [facility])    just happened in early February  . Urinary incontinence     PAST SURGICAL HISTORY:   Past Surgical History:  Procedure Laterality Date  . ENDARTERECTOMY Right 06/18/2015   Procedure: ENDARTERECTOMY RIGHT CAROTID;  Surgeon: Rosetta Posner, MD;  Location: [place];  Service: Vascular;  Laterality: Right;  . PATCH ANGIOPLASTY Right 06/18/2015    Procedure: PATCH ANGIOPLASTY RIGHT CAROTID ARTERY;  Surgeon: Rosetta Posner, MD;  Location: [place];  Service: Vascular;  Laterality: Right;  . PORTA CATH INSERTION N/A 04/13/2017   Procedure: PORTA CATH INSERTION;  Surgeon: Katha Cabal, MD;  Location: [place] CV LAB;  Service: Cardiovascular;  Laterality: N/A;    SOCIAL HISTORY:   Social History   Tobacco Use  . Smoking status: Former Smoker    Packs/day: 3.00    Years: 50.00    Pack years: 150.00    Last attempt to quit: 05/28/2015    Years since quitting: 1.8  . Smokeless tobacco: Never Used  Substance Use Topics  . Alcohol use: No    FAMILY HISTORY:   Family History  Problem Relation Age of Onset  . Diabetes Mother   . Cancer Father        Unknown of the type of cancer   . Diabetes Sister   . ALS Brother     DRUG ALLERGIES:  No Known Allergies  MEDICATIONS AT HOME:   Prior to Admission medications   Medication Sig Start Date End Date Taking? Authorizing Provider  acetaminophen (TYLENOL) 650 MG CR tablet Take 650 mg by mouth every 8 (eight) hours as needed for pain.   Yes [provider]  albuterol (PROVENTIL HFA;VENTOLIN HFA) 108 (90 Base) MCG/ACT inhaler Inhale 1-2 puffs into the lungs every 6 (six) hours as needed for wheezing or shortness  of breath. 04/06/17  Yes McLean-Scocuzza, Nino Glow, MD  amLODipine (NORVASC) 10 MG tablet Take 1 tablet (10 mg total) by mouth daily. 04/07/16  Yes Cook, Jayce G, DO  atorvastatin (LIPITOR) 80 MG tablet Take 1 tablet (80 mg total) by mouth daily at 6 PM. 04/07/16  Yes Cook, Jayce G, DO  dexamethasone (DECADRON) 6 MG tablet Take 6 mg by mouth 2 (two) times daily.   Yes [provider]  folic acid (FOLVITE) 1 MG tablet Take 1 mg by mouth daily.   Yes [provider]  LORazepam (ATIVAN) 0.5 MG tablet Qd to bid prn 04/06/17  Yes McLean-Scocuzza, Nino Glow, MD  oxybutynin (DITROPAN-XL) 5 MG 24 hr tablet Take 1 tablet (5 mg total) by mouth at bedtime.  04/06/17  Yes McLean-Scocuzza, Nino Glow, MD  pantoprazole (PROTONIX) 40 MG tablet Take 40 mg by mouth daily.   Yes [provider]  traZODone (DESYREL) 50 MG tablet Take 50 mg by mouth at bedtime as needed for sleep.    Yes [provider]  aspirin EC 81 MG tablet Take 81 mg by mouth daily.    [provider]  lidocaine-prilocaine (EMLA) cream Apply to affected area once 04/08/17   Lloyd Huger, MD  ondansetron (ZOFRAN) 8 MG tablet Take 1 tablet (8 mg total) by mouth 2 (two) times daily as needed for refractory nausea / vomiting. Patient not taking: Reported on 04/13/2017 04/08/17   Lloyd Huger, MD  prochlorperazine (COMPAZINE) 10 MG tablet Take 1 tablet (10 mg total) by mouth every 6 (six) hours as needed (Nausea or vomiting). Patient not taking: Reported on 04/13/2017 04/08/17   Lloyd Huger, MD    REVIEW OF SYSTEMS:  Review of Systems  Constitutional: Negative for chills, fever, malaise/fatigue and weight loss.  HENT: Negative for ear pain, hearing loss and tinnitus.   Eyes: Negative for blurred vision, double vision, pain and redness.  Respiratory: Negative for cough, hemoptysis and shortness of breath.   Cardiovascular: Negative for chest pain, palpitations, orthopnea and leg swelling.  Gastrointestinal: Negative for abdominal pain, constipation, diarrhea, nausea and vomiting.  Genitourinary: Negative for dysuria, frequency and hematuria.  Musculoskeletal: Negative for back pain, joint pain and neck pain.  Skin:       No acne, rash, or lesions  Neurological: Positive for seizures. Negative for dizziness, tremors, focal weakness and weakness.  Endo/Heme/Allergies: Negative for polydipsia. Does not bruise/bleed easily.  Psychiatric/Behavioral: Negative for depression. The patient is nervous/anxious. The patient does not have insomnia.      VITAL SIGNS:   Vitals:   04/14/17 1954 04/14/17 2030 04/14/17 2200 04/14/17 2209  BP: (!) 219/95  (!) 235/83 (!) 222/97 (!) 211/94  Pulse:    95  Resp:  10 (!) 21   Temp:      TempSrc:      SpO2:      Weight:      Height:       Wt Readings from Last 3 Encounters:  04/14/17 104.3 kg (230 lb)  04/14/17 103.9 kg (229 lb)  04/13/17 103.9 kg (229 lb)    PHYSICAL EXAMINATION:  Physical Exam  Vitals reviewed. Constitutional: He is oriented to person, place, and time. He appears well-developed and well-nourished. No distress.  HENT:  Head: Normocephalic and atraumatic.  Mouth/Throat: Oropharynx is clear and moist.  Eyes: Conjunctivae and EOM are normal. Pupils are equal, round, and reactive to light. No scleral icterus.  Neck: Normal range of motion. Neck supple.  No JVD present. No thyromegaly present.  Cardiovascular: Normal rate, regular rhythm and intact distal pulses. Exam reveals no gallop and no friction rub.  No murmur heard. Respiratory: Effort normal and breath sounds normal. No respiratory distress. He has no wheezes. He has no rales.  GI: Soft. Bowel sounds are normal. He exhibits no distension. There is no tenderness.  Musculoskeletal: Normal range of motion. He exhibits no edema.  No arthritis, no gout  Lymphadenopathy:    He has no cervical adenopathy.  Neurological: He is alert and oriented to person, place, and time. No cranial nerve deficit.  Patient was initially conversive though somewhat anxious.  However, during interview and exam patient had full on generalized tonic-clonic seizure.  He was postictal afterwards  Skin: Skin is warm and dry. No rash noted. No erythema.  Psychiatric: He has a normal mood and affect. His behavior is normal. Judgment and thought content normal.    LABORATORY PANEL:   CBC Recent Labs  Lab 04/14/17 2003  WBC 16.1*  HGB 15.8  HCT 47.3  PLT 166   ------------------------------------------------------------------------------------------------------------------  Chemistries  Recent Labs  Lab 04/14/17 1335  NA 131*  K 4.7   CL 100*  CO2 24  GLUCOSE 135*  BUN 35*  CREATININE 1.16  CALCIUM 8.1*   ------------------------------------------------------------------------------------------------------------------  Cardiac Enzymes No results for input(s): TROPONINI in the last 168 hours. ------------------------------------------------------------------------------------------------------------------  RADIOLOGY:  Ct Head Wo Contrast  Result Date: 04/14/2017 CLINICAL DATA:  Headache. Metastatic lung cancer. Rule out subarachnoid hemorrhage EXAM: CT HEAD WITHOUT CONTRAST TECHNIQUE: Contiguous axial images were obtained from the base of the skull through the vertex without intravenous contrast. COMPARISON:  MRI head 03/26/2017 FINDINGS: Brain: Mass lesion left lateral cerebellum measures 36 x 42 mm similar in size to the prior study. This has mixed cystic and solid component. Increased mass-effect on the fourth ventricle which is displaced to the right. No obstructive hydrocephalus. Left parietal convexity hemorrhagic metastatic deposit measures 28 x 22 mm and is increased in size. Surrounding edema has improved. No new mass lesions identified. Generalized atrophy. Chronic infarcts in the left cerebral white matter and left basal ganglia. Negative for acute infarct. Vascular: Negative for hyperdense vessel Skull: Negative Sinuses/Orbits: Air-fluid level right maxillary sinus Other: None IMPRESSION: Metastatic lesions in the left cerebellum and left parietal convexity with interval enlargement since recent studies compatible with progression of tumor. Progressive mass-effect on the fourth ventricle without obstructive hydrocephalus. Improvement in vasogenic edema in the left parietal lobe likely related to interval steroid treatment. Chronic infarct left basal ganglia and deep white matter. No acute infarct. Electronically Signed   By: Franchot Gallo M.D.   On: 04/14/2017 14:28    EKG:   Orders placed or performed in visit  on 04/14/17  . EKG 12-Lead    IMPRESSION AND PLAN:  Principal Problem:   Accelerated hypertension -blood pressure improved somewhat after his headache resolved, however we will keep PRN IV antihypertensives in place for blood pressure goal less than 160/100. Active Problems:   Headache -PRN analgesia in place   Seizure [facility]) -IV Ativan given initially to break the seizure, patient was loaded with IV Keppra afterwards.  EEG and neurology consult ordered   Small cell lung cancer [facility]) -given his acute pathology, oncology consult ordered.  Patient is already on Decadron, however he seized anyway.  Family is concerned he may be seizing at night at home   Hyperlipidemia -continue home meds  All the records are reviewed and case discussed  with ED provider. Management plans discussed with the patient and/or family.  DVT PROPHYLAXIS: SubQ lovenox  GI PROPHYLAXIS: None  ADMISSION STATUS: Inpatient  CODE STATUS: Full Code Status History    Date Active Date Inactive Code Status Order ID Comments User Context   05/21/2015 15:07 05/24/2015 20:23 Full Code [id number]  Francesca Oman, DO Inpatient    Advance Directive Documentation     Most Recent Value  Type of Advance Directive  Healthcare Power of Attorney  Pre-existing out of facility DNR order (yellow form or pink MOST form)  No data  "MOST" Form in Place?  No data      TOTAL TIME TAKING CARE OF THIS PATIENT: 45 minutes.   Coltyn Hanning [place] 04/14/2017, 10:42 PM  [employer]  [phone]  CC: Primary care physician; [name], MD  Note:  This document was prepared using Dragon voice recognition software and may include unintentional dictation errors.

## 2017-04-14 NOTE — ED Provider Notes (Signed)
[facility] Emergency Department Provider Note  ____________________________________________  Time seen: Approximately 7:45 PM  I have reviewed the triage vital signs and the nursing notes.   HISTORY  Chief Complaint Loss of Consciousness   HPI Jonathan Bowen is a 71 y.o. 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 Patient was seen here earlier today for similar complaints. His headache resolved with Tylenol and he was discharged back home. After arriving home patient started vomiting and has had several episodes of nonbloody nonbilious emesis. His headache then returned. He is complaining of 10 out of 10 frontal headache, sharp and throbbing, associated with photophobia. Try to take Tylenol at home however vomited. On his way to the hospital patient started feeling dizzy and had a brief episode of syncope in the back of the car. No chest pain or shortness of breath, no diarrhea, no fever or chills. Patient is also complaining of dysuria which has been present for the last few days.No facial droop, no slurred speech, no unilateral weakness or numbness. Patient reports that this is the same headache he had earlier today.  Past Medical History:  Diagnosis Date  . Agitation   . Complication of anesthesia    has never had surgery  . Dementia 2018  . Hyperlipidemia   . Hypertension   . 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 Smoker   . Stroke [facility])    just happened in early February  . Urinary incontinence     Patient Active Problem List   Diagnosis Date Noted  . Accelerated hypertension 04/14/2017  . Headache 04/14/2017  . Seizure ([place]) 04/14/2017  . Overactive bladder 04/06/2017  . Agitation 04/06/2017  . Insomnia 04/06/2017  . Small cell lung cancer ([place]) 03/30/2017  . Goals of care, counseling/discussion 03/30/2017  . History of CVA (cerebrovascular accident)  06/29/2015  . Carotid artery stenosis 06/18/2015  . Hyperlipidemia   . Essential hypertension     Past Surgical History:  Procedure Laterality Date  . ENDARTERECTOMY Right 06/18/2015   Procedure: ENDARTERECTOMY RIGHT CAROTID;  Surgeon: Rosetta Posner, MD;  Location: [place];  Service: Vascular;  Laterality: Right;  . PATCH ANGIOPLASTY Right 06/18/2015   Procedure: PATCH ANGIOPLASTY RIGHT CAROTID ARTERY;  Surgeon: Rosetta Posner, MD;  Location: [place];  Service: Vascular;  Laterality: Right;  . PORTA CATH INSERTION N/A 04/13/2017   Procedure: PORTA CATH INSERTION;  Surgeon: Katha Cabal, MD;  Location: [place] [facility];  Service: Cardiovascular;  Laterality: N/A;    Prior to Admission medications   Medication Sig Start Date End Date Taking? Authorizing Provider  acetaminophen (TYLENOL) 650 MG CR tablet Take 650 mg by mouth every 8 (eight) hours as needed for pain.   Yes [provider]  albuterol (PROVENTIL HFA;VENTOLIN HFA) 108 (90 Base) MCG/ACT inhaler Inhale 1-2 puffs into the lungs every 6 (six) hours as needed for wheezing or shortness of breath. 04/06/17  Yes McLean-Scocuzza, Nino Glow, MD  amLODipine (NORVASC) 10 MG tablet Take 1 tablet (10 mg total) by mouth daily. 04/07/16  Yes Cook, Jayce G, DO  atorvastatin (LIPITOR) 80 MG tablet Take 1 tablet (80 mg total) by mouth daily at 6 PM. 04/07/16  Yes Cook, Jayce G, DO  dexamethasone (DECADRON) 6 MG tablet Take 6 mg by mouth 2 (two) times daily.   Yes [provider]  folic acid (FOLVITE) 1 MG tablet  Take 1 mg by mouth daily.   Yes [provider]  LORazepam (ATIVAN) 0.5 MG tablet Qd to bid prn 04/06/17  Yes McLean-Scocuzza, Nino Glow, MD  oxybutynin (DITROPAN-XL) 5 MG 24 hr tablet Take 1 tablet (5 mg total) by mouth at bedtime. 04/06/17  Yes McLean-Scocuzza, Nino Glow, MD  pantoprazole (PROTONIX) 40 MG tablet Take 40 mg by mouth daily.   Yes [provider]  traZODone (DESYREL) 50 MG tablet Take 50 mg by  mouth at bedtime as needed for sleep.    Yes [provider]  aspirin EC 81 MG tablet Take 81 mg by mouth daily.    [provider]  lidocaine-prilocaine (EMLA) cream Apply to affected area once 04/08/17   Lloyd Huger, MD  ondansetron (ZOFRAN) 8 MG tablet Take 1 tablet (8 mg total) by mouth 2 (two) times daily as needed for refractory nausea / vomiting. Patient not taking: Reported on 04/13/2017 04/08/17   Lloyd Huger, MD  prochlorperazine (COMPAZINE) 10 MG tablet Take 1 tablet (10 mg total) by mouth every 6 (six) hours as needed (Nausea or vomiting). Patient not taking: Reported on 04/13/2017 04/08/17   Lloyd Huger, MD    Allergies Patient has no known allergies.  Family History  Problem Relation Age of Onset  . Diabetes Mother   . Cancer Father        Unknown of the type of cancer   . Diabetes Sister   . ALS Brother     Social History Social History   Tobacco Use  . Smoking status: Former Smoker    Packs/day: 3.00    Years: 50.00    Pack years: 150.00    Last attempt to quit: 05/28/2015    Years since quitting: 1.8  . Smokeless tobacco: Never Used  Substance Use Topics  . Alcohol use: No  . Drug use: No    Review of Systems  Constitutional: Negative for fever. Eyes: Negative for visual changes. ENT: Negative for sore throat. Neck: No neck pain  Cardiovascular: Negative for chest pain. Respiratory: Negative for shortness of breath. Gastrointestinal: Negative for abdominal pain,  Diarrhea. + vomiting Genitourinary: Negative for dysuria. Musculoskeletal: Negative for back pain. Skin: Negative for rash. Neurological: Negative for weakness or numbness. + HA Psych: No SI or HI  ____________________________________________   PHYSICAL EXAM:  VITAL SIGNS: ED Triage Vitals  Enc Vitals Group     BP 04/14/17 1852 (!) 162/93     Pulse Rate 04/14/17 1852 91     Resp 04/14/17 1852 20     Temp 04/14/17 1852 97.7 F (36.5 C)      Temp Source 04/14/17 1852 Oral     SpO2 04/14/17 1852 97 %     Weight 04/14/17 1853 230 lb (104.3 kg)     Height 04/14/17 1853 5\' 7"  (1.702 m)     Head Circumference --      Peak Flow --      Pain Score 04/14/17 1852 0     Pain Loc --      Pain Edu? --      Excl. in [place]? --     Constitutional: Alert and oriented. Well appearing and in no apparent distress. HEENT:      Head: Normocephalic and atraumatic.         Eyes: Conjunctivae are normal. Sclera is non-icteric.       Mouth/Throat: Mucous membranes are moist.       Neck: Supple with no  signs of meningismus. Cardiovascular: Regular rate and rhythm. No murmurs, gallops, or rubs. 2+ symmetrical distal pulses are present in all extremities. No JVD. Respiratory: Normal respiratory effort. Lungs are clear to auscultation bilaterally. No wheezes, crackles, or rhonchi.  Gastrointestinal: Soft, non tender, and non distended with positive bowel sounds. No rebound or guarding. Musculoskeletal: Nontender with normal range of motion in all extremities. No edema, cyanosis, or erythema of extremities. Neurologic: Normal speech and language. A & O x3, PERRL, EOMI, no nystagmus, CN II-XII intact, motor testing reveals good tone and bulk throughout. There is no evidence of pronator drift or dysmetria. Muscle strength is 5/5 throughout. Sensory examination is intact. Gait deferred Skin: Skin is warm, dry and intact. No rash noted. Psychiatric: Mood and affect are normal. Speech and behavior are normal.  ____________________________________________   LABS (all labs ordered are listed, but only abnormal results are displayed)  Labs Reviewed  URINALYSIS, COMPLETE (UACMP) WITH MICROSCOPIC - Abnormal; Notable for the following components:      Result Value   Color, Urine STRAW (*)    APPearance CLEAR (*)    Glucose, UA 50 (*)    Hgb urine dipstick MODERATE (*)    Protein, ur 30 (*)    Squamous Epithelial / LPF 0-5 (*)    All other components  within normal limits  CBC - Abnormal; Notable for the following components:   WBC 16.1 (*)    RDW 15.5 (*)    All other components within normal limits  URINE CULTURE  BASIC METABOLIC PANEL   ____________________________________________  EKG  ED ECG REPORT I, Rudene Re, the attending physician, personally viewed and interpreted this ECG.  Normal sinus rhythm, rate of 82, normal intervals, normal axis, no ST elevations or depressions. Unchanged from prior. ____________________________________________  RADIOLOGY  none  ____________________________________________   PROCEDURES  Procedure(s) performed: None Procedures Critical Care performed:  None ____________________________________________   INITIAL IMPRESSION / ASSESSMENT AND PLAN / ED COURSE  71 y.o. 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 Patient only has Tylenol at home for his headache. Has been vomiting since he left the hospital earlier today. He reports the headache is identical to the one who brought him to the hospital earlier today for which she underwent a CT had shown progression of metastatic disease but no bleeds. Do not feel the patient needs another scan at this time. Patient is only on 5 mg of amlodipine for his blood pressure and has had persistently elevated blood pressures and his last few visits. I will give him a dose of IV hydralazine and if patient goes home will plan on increasing his medication. Patient is complaining of dysuria for which an check a urinalysis. I'll give him fluids, Zofran, morphine for his symptoms. Family wishes for him to go home as he becomes very agitated in the hospital. The patient becomes more comfortable and pain is resolved and is tolerating by mouth and his labs are within normal limits I will plan on sending him home with prescriptions for Zofran and Percocet until he is able to see  his oncologist this week.   _________________________ 11:00 PM on 04/14/2017 ----------------------------------------- Patient reports his headache is improved however his blood pressure is persistently elevated. Family is very uncomfortable taking patient home especially now that the pharmacy is closed and they do not have anything other than Tylenol at home for his pain. Due to the fact that patient  had a syncopal episode, has persistently elevated blood pressure and 2 visits within 12 hours for headache with worsening metastatic disease on CT scan, the decision was made to admit patient.   Clinical Course as of Apr 16 7  Sat Apr 14, 2017  2319 Patient had a generalized tonic clonic seizure. Seizure resolved before patient received any medication. Given 4mg  of IV ativan. Will load with Keppra. Dr. Jannifer Franklin at the bedside.  [CV]    Clinical Course User Index [CV] Alfred Levins [place], MD     As part of my medical decision making, I reviewed the following data within the [place] History obtained from family, Nursing notes reviewed and incorporated, Labs reviewed , EKG interpreted , Old EKG reviewed, Old chart reviewed, Radiograph reviewed , Discussed with admitting physician , Notes from prior ED visits and [place] Controlled Substance Database    Pertinent labs & imaging results that were available during my care of the patient were reviewed by me and considered in my medical decision making (see chart for details).    ____________________________________________   FINAL CLINICAL IMPRESSION(S) / ED DIAGNOSES  Final diagnoses:  Syncope, unspecified syncope type  Acute nonintractable headache, unspecified headache type  Intractable vomiting with nausea, unspecified vomiting type  Hypertension, unspecified type  Seizures ([place])      NEW MEDICATIONS STARTED DURING THIS VISIT:  ED Discharge Orders    None       Note:  This document was prepared using Dragon  voice recognition software and may include unintentional dictation errors.    Rudene Re, MD 04/15/17 [phone]

## 2017-04-14 NOTE — ED Notes (Signed)
Pt had seizure, tonic clonic like while dr. Jannifer Franklin at bedside. Pt placed on oxygen at 4lpm post seizure, placed on cardiac monitor in sinus tachycardia. Pt is postictal at this time. Order for ativan iv received from dr. Jannifer Franklin.

## 2017-04-14 NOTE — ED Notes (Signed)
Jonathan Bowen, aware of bed assigned

## 2017-04-14 NOTE — ED Notes (Addendum)
Pt is asleep with eyes closed and snoring Family states that pt has a headache that is not able to be managed at home - spouse states pt is now unable to walk and refusing to eat - Family also reports that pt is vomiting but at this time this is not noted Spouse states that pt "passed out" while on the way back to the hospital

## 2017-04-14 NOTE — ED Triage Notes (Signed)
Pt states that he is having the worst headache today, pt has been diagnosed with stage 4 lung ca with mets to his brain, pt had a port placed yesterday and states that he hasn't felt like this until having that procedure yesterday

## 2017-04-14 NOTE — ED Notes (Signed)
Pt rudely requesting to speak with Dr. Burlene Arnt; Dr. Burlene Arnt aware and will talk to pt when he has a moment.

## 2017-04-15 ENCOUNTER — Other Ambulatory Visit: Payer: Self-pay

## 2017-04-15 DIAGNOSIS — Z87891 Personal history of nicotine dependence: Secondary | ICD-10-CM | POA: Diagnosis not present

## 2017-04-15 DIAGNOSIS — R410 Disorientation, unspecified: Secondary | ICD-10-CM | POA: Diagnosis not present

## 2017-04-15 DIAGNOSIS — M549 Dorsalgia, unspecified: Secondary | ICD-10-CM | POA: Diagnosis not present

## 2017-04-15 DIAGNOSIS — Z7982 Long term (current) use of aspirin: Secondary | ICD-10-CM | POA: Diagnosis not present

## 2017-04-15 DIAGNOSIS — Z79899 Other long term (current) drug therapy: Secondary | ICD-10-CM

## 2017-04-15 DIAGNOSIS — Z452 Encounter for adjustment and management of vascular access device: Secondary | ICD-10-CM | POA: Diagnosis not present

## 2017-04-15 DIAGNOSIS — G9340 Encephalopathy, unspecified: Secondary | ICD-10-CM | POA: Diagnosis not present

## 2017-04-15 DIAGNOSIS — R51 Headache: Secondary | ICD-10-CM | POA: Diagnosis not present

## 2017-04-15 DIAGNOSIS — Z833 Family history of diabetes mellitus: Secondary | ICD-10-CM | POA: Diagnosis not present

## 2017-04-15 DIAGNOSIS — R569 Unspecified convulsions: Secondary | ICD-10-CM | POA: Diagnosis not present

## 2017-04-15 DIAGNOSIS — R1900 Intra-abdominal and pelvic swelling, mass and lump, unspecified site: Secondary | ICD-10-CM

## 2017-04-15 DIAGNOSIS — E785 Hyperlipidemia, unspecified: Secondary | ICD-10-CM

## 2017-04-15 DIAGNOSIS — Z7401 Bed confinement status: Secondary | ICD-10-CM | POA: Diagnosis not present

## 2017-04-15 DIAGNOSIS — I10 Essential (primary) hypertension: Secondary | ICD-10-CM | POA: Diagnosis not present

## 2017-04-15 DIAGNOSIS — R112 Nausea with vomiting, unspecified: Secondary | ICD-10-CM | POA: Diagnosis not present

## 2017-04-15 DIAGNOSIS — R531 Weakness: Secondary | ICD-10-CM | POA: Diagnosis not present

## 2017-04-15 DIAGNOSIS — R32 Unspecified urinary incontinence: Secondary | ICD-10-CM | POA: Diagnosis not present

## 2017-04-15 DIAGNOSIS — Z8673 Personal history of transient ischemic attack (TIA), and cerebral infarction without residual deficits: Secondary | ICD-10-CM | POA: Diagnosis not present

## 2017-04-15 DIAGNOSIS — G40802 Other epilepsy, not intractable, without status epilepticus: Secondary | ICD-10-CM

## 2017-04-15 DIAGNOSIS — R55 Syncope and collapse: Secondary | ICD-10-CM | POA: Diagnosis not present

## 2017-04-15 DIAGNOSIS — K219 Gastro-esophageal reflux disease without esophagitis: Secondary | ICD-10-CM | POA: Diagnosis present

## 2017-04-15 DIAGNOSIS — N39498 Other specified urinary incontinence: Secondary | ICD-10-CM | POA: Diagnosis not present

## 2017-04-15 DIAGNOSIS — G40909 Epilepsy, unspecified, not intractable, without status epilepticus: Secondary | ICD-10-CM | POA: Diagnosis not present

## 2017-04-15 DIAGNOSIS — F0150 Vascular dementia without behavioral disturbance: Secondary | ICD-10-CM | POA: Diagnosis not present

## 2017-04-15 DIAGNOSIS — R404 Transient alteration of awareness: Secondary | ICD-10-CM | POA: Diagnosis not present

## 2017-04-15 DIAGNOSIS — Z9181 History of falling: Secondary | ICD-10-CM

## 2017-04-15 DIAGNOSIS — I69318 Other symptoms and signs involving cognitive functions following cerebral infarction: Secondary | ICD-10-CM | POA: Diagnosis not present

## 2017-04-15 DIAGNOSIS — Z7189 Other specified counseling: Secondary | ICD-10-CM | POA: Diagnosis not present

## 2017-04-15 DIAGNOSIS — Z7952 Long term (current) use of systemic steroids: Secondary | ICD-10-CM | POA: Diagnosis not present

## 2017-04-15 DIAGNOSIS — Z66 Do not resuscitate: Secondary | ICD-10-CM | POA: Diagnosis present

## 2017-04-15 DIAGNOSIS — C7931 Secondary malignant neoplasm of brain: Secondary | ICD-10-CM

## 2017-04-15 DIAGNOSIS — G40409 Other generalized epilepsy and epileptic syndromes, not intractable, without status epilepticus: Secondary | ICD-10-CM | POA: Diagnosis present

## 2017-04-15 DIAGNOSIS — J449 Chronic obstructive pulmonary disease, unspecified: Secondary | ICD-10-CM | POA: Diagnosis not present

## 2017-04-15 DIAGNOSIS — C3432 Malignant neoplasm of lower lobe, left bronchus or lung: Secondary | ICD-10-CM

## 2017-04-15 DIAGNOSIS — Z515 Encounter for palliative care: Secondary | ICD-10-CM | POA: Diagnosis not present

## 2017-04-15 DIAGNOSIS — G4089 Other seizures: Secondary | ICD-10-CM | POA: Diagnosis not present

## 2017-04-15 DIAGNOSIS — R451 Restlessness and agitation: Secondary | ICD-10-CM

## 2017-04-15 DIAGNOSIS — C3490 Malignant neoplasm of unspecified part of unspecified bronchus or lung: Secondary | ICD-10-CM | POA: Diagnosis not present

## 2017-04-15 DIAGNOSIS — J441 Chronic obstructive pulmonary disease with (acute) exacerbation: Secondary | ICD-10-CM | POA: Diagnosis present

## 2017-04-15 DIAGNOSIS — F0390 Unspecified dementia without behavioral disturbance: Secondary | ICD-10-CM | POA: Diagnosis not present

## 2017-04-15 DIAGNOSIS — N281 Cyst of kidney, acquired: Secondary | ICD-10-CM

## 2017-04-15 LAB — BASIC METABOLIC PANEL
ANION GAP: 7 (ref 5–15)
BUN: 38 mg/dL — AB (ref 6–20)
CO2: 25 mmol/L (ref 22–32)
Calcium: 7.8 mg/dL — ABNORMAL LOW (ref 8.9–10.3)
Chloride: 100 mmol/L — ABNORMAL LOW (ref 101–111)
Creatinine, Ser: 1.03 mg/dL (ref 0.61–1.24)
Glucose, Bld: 139 mg/dL — ABNORMAL HIGH (ref 65–99)
POTASSIUM: 4.9 mmol/L (ref 3.5–5.1)
SODIUM: 132 mmol/L — AB (ref 135–145)

## 2017-04-15 LAB — CBC
HEMATOCRIT: 40.8 % (ref 40.0–52.0)
HEMOGLOBIN: 13.6 g/dL (ref 13.0–18.0)
MCH: 28.9 pg (ref 26.0–34.0)
MCHC: 33.2 g/dL (ref 32.0–36.0)
MCV: 87.1 fL (ref 80.0–100.0)
Platelets: 124 10*3/uL — ABNORMAL LOW (ref 150–440)
RBC: 4.69 MIL/uL (ref 4.40–5.90)
RDW: 15.2 % — AB (ref 11.5–14.5)
WBC: 11.4 10*3/uL — AB (ref 3.8–10.6)

## 2017-04-15 MED ORDER — MAGNESIUM SULFATE 2 GM/50ML IV SOLN
2.0000 g | Freq: Once | INTRAVENOUS | Status: AC
  Administered 2017-04-15: 15:00:00 2 g via INTRAVENOUS
  Filled 2017-04-15: qty 50

## 2017-04-15 MED ORDER — ATORVASTATIN CALCIUM 20 MG PO TABS
80.0000 mg | ORAL_TABLET | Freq: Every day | ORAL | Status: DC
  Administered 2017-04-16: 80 mg via ORAL
  Filled 2017-04-15: qty 4

## 2017-04-15 MED ORDER — LORAZEPAM 0.5 MG PO TABS
0.5000 mg | ORAL_TABLET | Freq: Four times a day (QID) | ORAL | Status: DC | PRN
  Administered 2017-04-15 – 2017-04-18 (×3): 0.5 mg via ORAL
  Filled 2017-04-15 (×3): qty 1

## 2017-04-15 MED ORDER — HYDROCHLOROTHIAZIDE 12.5 MG PO CAPS
12.5000 mg | ORAL_CAPSULE | Freq: Every day | ORAL | Status: DC
  Administered 2017-04-16 – 2017-04-18 (×3): 12.5 mg via ORAL
  Filled 2017-04-15 (×4): qty 1

## 2017-04-15 MED ORDER — LABETALOL HCL 5 MG/ML IV SOLN: 10.0000 mg | INTRAVENOUS | Status: DC | PRN

## 2017-04-15 MED ORDER — OXYCODONE HCL 5 MG PO TABS
5.0000 mg | ORAL_TABLET | ORAL | Status: DC | PRN
  Administered 2017-04-17 – 2017-04-18 (×3): 5 mg via ORAL
  Filled 2017-04-15 (×3): qty 1

## 2017-04-15 MED ORDER — SODIUM CHLORIDE 0.9 % IV SOLN
INTRAVENOUS | Status: DC
  Administered 2017-04-15: 17:00:00 via INTRAVENOUS

## 2017-04-15 MED ORDER — ONDANSETRON HCL 4 MG PO TABS: 4.0000 mg | ORAL_TABLET | Freq: Four times a day (QID) | ORAL | Status: DC | PRN

## 2017-04-15 MED ORDER — ALBUTEROL SULFATE (2.5 MG/3ML) 0.083% IN NEBU: 3.0000 mL | INHALATION_SOLUTION | Freq: Four times a day (QID) | RESPIRATORY_TRACT | Status: DC | PRN

## 2017-04-15 MED ORDER — ASPIRIN EC 81 MG PO TBEC
81.0000 mg | DELAYED_RELEASE_TABLET | Freq: Every day | ORAL | Status: DC
  Administered 2017-04-16 – 2017-04-18 (×3): 81 mg via ORAL
  Filled 2017-04-15 (×3): qty 1

## 2017-04-15 MED ORDER — KETOROLAC TROMETHAMINE 30 MG/ML IJ SOLN
30.0000 mg | Freq: Once | INTRAMUSCULAR | Status: AC
  Administered 2017-04-15: 30 mg via INTRAVENOUS
  Filled 2017-04-15: qty 1

## 2017-04-15 MED ORDER — PANTOPRAZOLE SODIUM 40 MG PO TBEC
40.0000 mg | DELAYED_RELEASE_TABLET | Freq: Every day | ORAL | Status: DC
  Administered 2017-04-15 – 2017-04-18 (×4): 40 mg via ORAL
  Filled 2017-04-15 (×4): qty 1

## 2017-04-15 MED ORDER — VALPROATE SODIUM 500 MG/5ML IV SOLN
500.0000 mg | Freq: Two times a day (BID) | INTRAVENOUS | Status: DC
  Administered 2017-04-15 – 2017-04-16 (×2): 500 mg via INTRAVENOUS
  Filled 2017-04-15 (×3): qty 5

## 2017-04-15 MED ORDER — ACETAMINOPHEN 650 MG RE SUPP: 650.0000 mg | Freq: Four times a day (QID) | RECTAL | Status: DC | PRN

## 2017-04-15 MED ORDER — AMLODIPINE BESYLATE 10 MG PO TABS
10.0000 mg | ORAL_TABLET | Freq: Every day | ORAL | Status: DC
  Administered 2017-04-15 – 2017-04-18 (×4): 10 mg via ORAL
  Filled 2017-04-15 (×4): qty 1

## 2017-04-15 MED ORDER — VALPROATE SODIUM 500 MG/5ML IV SOLN
1000.0000 mg | Freq: Once | INTRAVENOUS | Status: AC
  Administered 2017-04-15: 1000 mg via INTRAVENOUS
  Filled 2017-04-15: qty 10

## 2017-04-15 MED ORDER — LEVETIRACETAM 500 MG/5ML IV SOLN
1000.0000 mg | Freq: Two times a day (BID) | INTRAVENOUS | Status: DC
  Administered 2017-04-15 (×2): 1000 mg via INTRAVENOUS
  Filled 2017-04-15 (×4): qty 10

## 2017-04-15 MED ORDER — OXYBUTYNIN CHLORIDE ER 5 MG PO TB24
5.0000 mg | ORAL_TABLET | Freq: Every day | ORAL | Status: DC
  Filled 2017-04-15: qty 1

## 2017-04-15 MED ORDER — ONDANSETRON HCL 4 MG/2ML IJ SOLN: 4.0000 mg | Freq: Four times a day (QID) | INTRAMUSCULAR | Status: DC | PRN

## 2017-04-15 MED ORDER — ENOXAPARIN SODIUM 40 MG/0.4ML ~~LOC~~ SOLN
40.0000 mg | SUBCUTANEOUS | Status: DC
  Administered 2017-04-15 – 2017-04-17 (×3): 40 mg via SUBCUTANEOUS
  Filled 2017-04-15 (×3): qty 0.4

## 2017-04-15 MED ORDER — HYDRALAZINE HCL 20 MG/ML IJ SOLN
10.0000 mg | INTRAMUSCULAR | Status: DC | PRN
  Administered 2017-04-15: 14:00:00 10 mg via INTRAVENOUS
  Filled 2017-04-15: qty 1

## 2017-04-15 MED ORDER — LORAZEPAM 2 MG/ML IJ SOLN
2.0000 mg | INTRAMUSCULAR | Status: DC | PRN
  Administered 2017-04-15 (×2): 2 mg via INTRAVENOUS
  Filled 2017-04-15 (×3): qty 1

## 2017-04-15 MED ORDER — ACETAMINOPHEN 325 MG PO TABS
650.0000 mg | ORAL_TABLET | Freq: Four times a day (QID) | ORAL | Status: DC | PRN
  Administered 2017-04-16: 10:00:00 650 mg via ORAL
  Filled 2017-04-15: qty 2

## 2017-04-15 MED ORDER — LORAZEPAM 2 MG/ML IJ SOLN
0.5000 mg | Freq: Four times a day (QID) | INTRAMUSCULAR | Status: DC | PRN
  Administered 2017-04-16 – 2017-04-17 (×4): 0.5 mg via INTRAVENOUS
  Filled 2017-04-15 (×3): qty 1

## 2017-04-15 MED ORDER — DEXAMETHASONE 4 MG PO TABS: 6.0000 mg | ORAL_TABLET | Freq: Two times a day (BID) | ORAL | Status: DC

## 2017-04-15 MED ORDER — TRAZODONE HCL 50 MG PO TABS
50.0000 mg | ORAL_TABLET | Freq: Every evening | ORAL | Status: DC | PRN
  Administered 2017-04-16 – 2017-04-17 (×2): 50 mg via ORAL
  Filled 2017-04-15 (×2): qty 1

## 2017-04-15 MED ORDER — DEXAMETHASONE SODIUM PHOSPHATE 10 MG/ML IJ SOLN
10.0000 mg | Freq: Two times a day (BID) | INTRAMUSCULAR | Status: DC
  Administered 2017-04-15 – 2017-04-16 (×4): 10 mg via INTRAVENOUS
  Filled 2017-04-15 (×4): qty 1

## 2017-04-15 MED ORDER — HALOPERIDOL LACTATE 5 MG/ML IJ SOLN
1.0000 mg | Freq: Four times a day (QID) | INTRAMUSCULAR | Status: DC | PRN
  Administered 2017-04-15 – 2017-04-18 (×5): 1 mg via INTRAVENOUS
  Filled 2017-04-15 (×5): qty 1

## 2017-04-15 NOTE — Progress Notes (Signed)
Patient's family advised to not feed patient due to decreased responsiveness after receiving ativan for agitation.  Patient is pocketing food on the right side and is at risk of aspiration.  Dr. Leslye Peer made aware and NPO order placed.  Patient will be seen by speech tomorrow.  Clarise Cruz, RN

## 2017-04-15 NOTE — Consult Note (Signed)
[facility]  Date of admission:  04/14/2017  Inpatient day:  04/15/2017  Consulting physician: Dr Lance Coon.   Reason for Consultation:  Brain metastasis, new seizure.  Chief Complaint: Jonathan Bowen is a 71 y.o. 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  HPI:  The patient presented on 03/21/2017 to the [facility] ER with slurred speech and balance issues.  Head CT without contrast revealed a 2.3 cm in the left parietal lobe with surrounding edema. There was also edema in the left cerebellar hemisphere likely related to an underlying lesion.  He was transferred to [facility].  He was admitted to [facility] from 03/21/2017 - 03/29/2017.  Chest, abdomen and pelvic CT scan on 03/22/2017 revealed a 2.3 cm lobulated solid mass of the left lower lobe. There was a nonspecific 7 mm hypoattenuating mass in the head of the pancreas and a 9 mm right renal lesion suggestive of a cyst.    Head MRI on 03/27/2017 revealed a 3.3 x 4.3 cm left lateral cerebellar lesion and a 2.3 x 2.9 cm left medial parietal lesion. Both had mixed cystic and solid components and were associated with vasogenic genetic edema. There was a rightward displacement of the cerebellar peduncle's and fourth ventricle but no evidence of obstruction.   He was treated with Decadron.  CT-guided lung biopsy on 03/23/2017 confirmed small cell lung cancer.  He was seen by radiation oncology and medical oncology.  Plan was for whole brain radiation followed by either surgical resection of the 2 CNS lesions or stereotactic radiosurgery.   Palliative chemotherapy was also discussed.  He was seen in the medical oncology clinic by Dr. Delight Hoh.  PET scan on 04/03/2017 revealed a hypermetabolic left lower lobe pulmonary nodule (SUV 7.5), hypermetabolic masses in the left parietal lobe and left cerebellum. There  were no findings of hypermetabolic adenopathy in the chest, abdomen or pelvis.   He was interested in pursuing palliative therapy.  Radiation oncology consultation with Dr. Baruch Gouty was scheduled for 04/16/2017.   Port-A-Cath was placed on 04/13/2017 and in anticipation of palliative carboplatin and etoposide (scheduled for 04/18/2016).  The patient's daughter notes that he has been weak and falling. For the past week, he has been sleeping a lot. Initially had intermittent headaches which were relieved with Tylenol. Yesterday, headaches were severe. He also had vomiting. He had 2 episodes of loss of consciousness yesterday.  Patient presented to the ER with headache and hypertension twice yesterday.  Head CT without contrast on 04/14/2017 revealed a 3.6 x 4.2 cm mass in the left lateral cerebellum mixed cystic and solid components. There was increase in mass effect on the fourth ventricle which was displaced to the right. There was no obstructive hydrocephalus. The left parietal hemorrhagic lesion was 2.8 x 2.2 cm and had increased in size with surrounding edema.  He was initially treated and discharged from the emergency room. He had worsening headache unrelieved with Tylenol and poorly controlled hypertension.  During his second evaluation, he had a tonic-clonic seizure.  He was loaded with Keppra.  He was seen by neurology. He has been loaded with Depakote with plans to discontinue Keppra once he attains therapeutic levels.  The patient's daughter notes that he has been agitated. He wants to get out of bed.  As he has been "feeling bad", he verbalized that he does not want to treatment.  He is confused.  He believes that he is at home.   Past Medical History:  Diagnosis Date  . Agitation   . Complication of anesthesia    has never had surgery  . Dementia 2018  . Hyperlipidemia   . Hypertension   . Small cell lung cancer ([place])    with brain mets 03/2017   . Smoker   . Stroke [facility])    just  happened in early February  . Urinary incontinence     Past Surgical History:  Procedure Laterality Date  . ENDARTERECTOMY Right 06/18/2015   Procedure: ENDARTERECTOMY RIGHT CAROTID;  Surgeon: Rosetta Posner, MD;  Location: [facility];  Service: Vascular;  Laterality: Right;  . PATCH ANGIOPLASTY Right 06/18/2015   Procedure: PATCH ANGIOPLASTY RIGHT CAROTID ARTERY;  Surgeon: Rosetta Posner, MD;  Location: [place];  Service: Vascular;  Laterality: Right;  . PORTA CATH INSERTION N/A 04/13/2017   Procedure: PORTA CATH INSERTION;  Surgeon: Katha Cabal, MD;  Location: [facility];  Service: Cardiovascular;  Laterality: N/A;    Family History  Problem Relation Age of Onset  . Diabetes Mother   . Cancer Father        Unknown of the type of cancer   . Diabetes Sister   . ALS Brother     Social History:  reports that he quit smoking about 22 months ago. He has a 150.00 pack-year smoking history. he has never used smokeless tobacco. He reports that he does not drink alcohol or use drugs.  He is accompanied by his daughter.  She has medical power of attorney.  Allergies: No Known Allergies  Medications Prior to Admission  Medication Sig Dispense Refill  . acetaminophen (TYLENOL) 650 MG CR tablet Take 650 mg by mouth every 8 (eight) hours as needed for pain.    Marland Kitchen albuterol (PROVENTIL HFA;VENTOLIN HFA) 108 (90 Base) MCG/ACT inhaler Inhale 1-2 puffs into the lungs every 6 (six) hours as needed for wheezing or shortness of breath. 1 Inhaler 2  . amLODipine (NORVASC) 10 MG tablet Take 1 tablet (10 mg total) by mouth daily. 90 tablet 3  . atorvastatin (LIPITOR) 80 MG tablet Take 1 tablet (80 mg total) by mouth daily at 6 PM. 90 tablet 3  . dexamethasone (DECADRON) 6 MG tablet Take 6 mg by mouth 2 (two) times daily.    . folic acid (FOLVITE) 1 MG tablet Take 1 mg by mouth daily.    Marland Kitchen LORazepam (ATIVAN) 0.5 MG tablet Qd to bid prn 60 tablet 0  . oxybutynin (DITROPAN-XL) 5 MG 24 hr tablet Take 1  tablet (5 mg total) by mouth at bedtime. 30 tablet 1  . pantoprazole (PROTONIX) 40 MG tablet Take 40 mg by mouth daily.    . traZODone (DESYREL) 50 MG tablet Take 50 mg by mouth at bedtime as needed for sleep.     Marland Kitchen aspirin EC 81 MG tablet Take 81 mg by mouth daily.    Marland Kitchen lidocaine-prilocaine (EMLA) cream Apply to affected area once 30 g 3  . ondansetron (ZOFRAN) 8 MG tablet Take 1 tablet (8 mg total) by mouth 2 (two) times daily as needed for refractory nausea / vomiting. (Patient not taking: Reported on 04/13/2017) 30 tablet 2  . prochlorperazine (COMPAZINE) 10 MG tablet Take 1 tablet (10 mg total) by mouth every 6 (six) hours as needed (Nausea or vomiting). (Patient not taking: Reported on 04/13/2017) 60 tablet 2    Review of Systems: GENERAL:  Feels "bad".  Weak and falling.  No fevers, sweats or weight loss. PERFORMANCE STATUS (ECOG):  2-3. HEENT:  No visual changes, runny nose, sore throat, mouth sores or tenderness. Lungs: No shortness of breath or cough.  No hemoptysis. Cardiac:  No chest pain, palpitations, orthopnea, or PND. GI:  Vomiting yesterday.  No diarrhea, constipation, melena or hematochezia. GU:  No urgency, frequency, dysuria, or hematuria. Musculoskeletal:  No back pain.  No joint pain.  No muscle tenderness. Extremities:  No pain or swelling. Skin:  No rashes or skin changes. Neuro:  Generalized weakness.  Headache. Tonic clonic seizure yesterday.  Right sided weakness s/p CVA now worse since diagnosis of metastatic lung cancer.   Endocrine:  No diabetes, thyroid issues, hot flashes or night sweats. Psych:  Restless. Pain:  No focal pain. Review of systems:  All other systems reviewed and found to be negative.  Physical Exam:  Blood pressure (!) 186/72, pulse 61, temperature 97.7 F (36.5 C), temperature source Oral, resp. rate 18, height 5' 7"  (1.702 m), weight 230 lb (104.3 kg), SpO2 96 %.  GENERAL:  Disheveled chronically ill appearing gentleman lying in a padded  bed on the medical unit in no acute distress.  He is agitated. MENTAL STATUS:  Alert and oriented to person only.  He believes that he is at home.  Believes that "it is close to January" (unknown year). HEAD:  Graying disheveled hair.  Normocephalic, atraumatic, face symmetric, no Cushingoid features. EYES:  Blue eyes.  Pupils equal round and reactive to light and accomodation.  No conjunctivitis or scleral icterus. ENT:  Oropharynx clear without lesion.  Tongue normal.  Edentulous.  Mucous membranes dry.  RESPIRATORY:  Clear to auscultation without rales, wheezes or rhonchi. CARDIOVASCULAR:  Regular rate and rhythm without murmur, rub or gallop. CHEST:  Right sided port-a-cath. ABDOMEN:  Soft, non-tender, with active bowel sounds, and no hepatosplenomegaly.  No masses. SKIN:  No rashes, ulcers or lesions. EXTREMITIES: No edema, no skin discoloration or tenderness.  No palpable cords. LYMPH NODES: No palpable cervical, supraclavicular, axillary or inguinal adenopathy  NEUROLOGICAL:  Cranial nerves II-XII intact except for decreased right nasolabial fold.  Right upper extremity strength 4/5.  Left lower extremity strength 3/5.  Sensation decreased on right side.  Bilateral patellar reflexes 0.  No clonus or Babinski. PSYCHOLOGICAL:  Agitated.   Results for orders placed or performed during the hospital encounter of 04/14/17 (from the past 48 hour(s))  Urinalysis, Complete w Microscopic     Status: Abnormal   Collection Time: 04/14/17  8:03 PM  Result Value Ref Range   Color, Urine STRAW (A) YELLOW   APPearance CLEAR (A) CLEAR   Specific Gravity, Urine 1.013 1.005 - 1.030   pH 7.0 5.0 - 8.0   Glucose, UA 50 (A) NEGATIVE mg/dL   Hgb urine dipstick MODERATE (A) NEGATIVE   Bilirubin Urine NEGATIVE NEGATIVE   Ketones, ur NEGATIVE NEGATIVE mg/dL   Protein, ur 30 (A) NEGATIVE mg/dL   Nitrite NEGATIVE NEGATIVE   Leukocytes, UA NEGATIVE NEGATIVE   RBC / HPF TOO NUMEROUS TO COUNT 0 - 5 RBC/hpf    WBC, UA 0-5 0 - 5 WBC/hpf   Bacteria, UA NONE SEEN NONE SEEN   Squamous Epithelial / LPF 0-5 (A) NONE SEEN   Mucus PRESENT     Comment: Performed at [facility], [address], [place], [place] [address]  CBC     Status: Abnormal   Collection Time: 04/14/17  8:03 PM  Result Value  Ref Range   WBC 16.1 (H) 3.8 - 10.6 K/uL   RBC 5.47 4.40 - 5.90 MIL/uL   Hemoglobin 15.8 13.0 - 18.0 g/dL   HCT 47.3 40.0 - 52.0 %   MCV 86.5 80.0 - 100.0 fL   MCH 28.9 26.0 - 34.0 pg   MCHC 33.4 32.0 - 36.0 g/dL   RDW 15.5 (H) 11.5 - 14.5 %   Platelets 166 150 - 440 K/uL    Comment: Performed at [facility], [address] [place], [place] [address]  Basic metabolic panel     Status: Abnormal   Collection Time: 04/15/17  4:45 AM  Result Value Ref Range   Sodium 132 (L) 135 - 145 mmol/L   Potassium 4.9 3.5 - 5.1 mmol/L   Chloride 100 (L) 101 - 111 mmol/L   CO2 25 22 - 32 mmol/L   Glucose, Bld 139 (H) 65 - 99 mg/dL   BUN 38 (H) 6 - 20 mg/dL   Creatinine, Ser 1.03 0.61 - 1.24 mg/dL   Calcium 7.8 (L) 8.9 - 10.3 mg/dL   GFR calc non Af Amer >60 >60 mL/min   GFR calc Af Amer >60 >60 mL/min    Comment: (NOTE) The eGFR has been calculated using the CKD EPI equation. This calculation has not been validated in all clinical situations. eGFR's persistently <60 mL/min signify possible Chronic Kidney Disease.    Anion gap 7 5 - 15    Comment: Performed at [facility], [address] [place], [place] [address]  CBC     Status: Abnormal   Collection Time: 04/15/17  4:45 AM  Result Value Ref Range   WBC 11.4 (H) 3.8 - 10.6 K/uL   RBC 4.69 4.40 - 5.90 MIL/uL   Hemoglobin 13.6 13.0 - 18.0 g/dL   HCT 40.8 40.0 - 52.0 %   MCV 87.1 80.0 - 100.0 fL   MCH 28.9 26.0 - 34.0 pg   MCHC 33.2 32.0 - 36.0 g/dL   RDW 15.2 (H) 11.5 - 14.5 %   Platelets 124 (L) 150 - 440 K/uL    Comment: Performed at [facility], [address], [place],  [address]   Ct Head Wo  Contrast  Result Date: 04/14/2017 CLINICAL DATA:  Headache. Metastatic lung cancer. Rule out subarachnoid hemorrhage EXAM: CT HEAD WITHOUT CONTRAST TECHNIQUE: Contiguous axial images were obtained from the base of the skull through the vertex without intravenous contrast. COMPARISON:  MRI head 03/26/2017 FINDINGS: Brain: Mass lesion left lateral cerebellum measures 36 x 42 mm similar in size to the prior study. This has mixed cystic and solid component. Increased mass-effect on the fourth ventricle which is displaced to the right. No obstructive hydrocephalus. Left parietal convexity hemorrhagic metastatic deposit measures 28 x 22 mm and is increased in size. Surrounding edema has improved. No new mass lesions identified. Generalized atrophy. Chronic infarcts in the left cerebral white matter and left basal ganglia. Negative for acute infarct. Vascular: Negative for hyperdense vessel Skull: Negative Sinuses/Orbits: Air-fluid level right maxillary sinus Other: None IMPRESSION: Metastatic lesions in the left cerebellum and left parietal convexity with interval enlargement since recent studies compatible with progression of tumor. Progressive mass-effect on the fourth ventricle without obstructive hydrocephalus. Improvement in vasogenic edema in the left parietal lobe likely related to interval steroid treatment. Chronic infarct left basal ganglia and deep white matter. No acute infarct. Electronically Signed   By: Franchot Gallo M.D.   On: 04/14/2017 14:28  Assessment:  The patient is a 71 y.o. gentleman with extensive stage small cell lung cancer with brain metastasis who was admitted through the emergency room with headache, seizure, and accelerated hypertension.  PET scan on 04/03/2017 revealed a hypermetabolic left lower lobe pulmonary nodule (SUV 7.5), and hypermetabolic masses in the left parietal lobe and left cerebellum. There were no findings of hypermetabolic adenopathy in the chest, abdomen or  pelvis.  No other clear metastatic disease.    Head MRI on 03/27/2017 revealed a 3.3 x 4.3 cm left lateral cerebellar lesion and a 2.3 x 2.9 cm left medial parietal lesion. Both had mixed cystic and solid components and were associated with vasogenic genetic edema. There was a rightward displacement of the cerebellar peduncle's and fourth ventricle but no evidence of obstruction.   Head CT without contrast on 04/14/2017 revealed a 3.6 x 4.2 cm mass in the left lateral cerebellum mixed cystic and solid components. There was increase in mass effect on the fourth ventricle which was displaced to the right. There was no obstructive hydrocephalus. The left parietal hemorrhagic lesion was 2.8 x 2.2 cm and had increased in size with surrounding edema.  Symptomatically, he is agitated.  He has had no further seizures on Keppra.  He is being loaded with Depakote.  Plan:   1.  Oncology:  Patient has symptomatic brain metastasis from small cell lung cancer.  Patient on Decadron.  Consult Dr Baruch Gouty in AM for initiation of radiation.  Patient has limited systemic disease.  Anticipate management of his symptomatic brain metastasis prior to systemic chemotherapy.  2.  Neurology:  Patient agitated but no further seizures.  Keppra being transitioned to Depakote.  Appreciate neurology consultation.  3.  Cardiology:  Hypertension being managed with amlodipine and hydralazine.  4.  Disposition:  Code status DNR.  Prior to recent events, patient had wished to pursue therapy.  His daughter states that he has now voiced the desire not to receive therapy as he is "feeling so bad".  Discussed addressing brain metastasis.   Thank you for allowing me to participate in Lamontae Ricardo 's care.  I will follow him closely with you while hospitalized until Dr. Gary Fleet return on 04/16/2017.   Lequita Asal, MD  04/15/2017, 12:50 PM

## 2017-04-15 NOTE — Consult Note (Signed)
Reason for Consult:Seizure Referring Physician: [place]  CC: Seizure  HPI: Jonathan Bowen is an 71 y.o. male with a history of metastatic lung cancer with associated bran metastasis who was noted to have a generalized tonic clonic seizure.  Per report of daughter this was his first.  He has declined treatment for his metastasis.  Was on steroids.  Has been eating poorly recently.   After seizure patient was unresponsive all evening.  Is now agitated but no further seizure activity noted.  Has been loaded with Keppra.      Past Medical History:  Diagnosis Date  . Agitation   . Complication of anesthesia    has never had surgery  . Dementia 2018  . Hyperlipidemia   . Hypertension   . 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 Smoker   . Stroke [facility])    just happened in early February  . Urinary incontinence     Past Surgical History:  Procedure Laterality Date  . ENDARTERECTOMY Right 06/18/2015   Procedure: ENDARTERECTOMY RIGHT CAROTID;  Surgeon: Rosetta Posner, MD;  Location: [place];  Service: Vascular;  Laterality: Right;  . PATCH ANGIOPLASTY Right 06/18/2015   Procedure: PATCH ANGIOPLASTY RIGHT CAROTID ARTERY;  Surgeon: Rosetta Posner, MD;  Location: [place];  Service: Vascular;  Laterality: Right;  . PORTA CATH INSERTION N/A 04/13/2017   Procedure: PORTA CATH INSERTION;  Surgeon: Katha Cabal, MD;  Location: [place] [facility];  Service: Cardiovascular;  Laterality: N/A;    Family History  Problem Relation Age of Onset  . Diabetes Mother   . Cancer Father        Unknown of the type of cancer   . Diabetes Sister   . ALS Brother     Social History:  reports that he quit smoking about 22 months ago. He has a 150.00 pack-year smoking history. he has never used smokeless tobacco. He reports that he does not drink alcohol or use drugs.  No Known Allergies  Medications:  I have reviewed the patient's current medications. Prior to Admission:  Medications Prior  to Admission  Medication Sig Dispense Refill Last Dose  . acetaminophen (TYLENOL) 650 MG CR tablet Take 650 mg by mouth every 8 (eight) hours as needed for pain.   Past Week at Unknown time  . albuterol (PROVENTIL HFA;VENTOLIN HFA) 108 (90 Base) MCG/ACT inhaler Inhale 1-2 puffs into the lungs every 6 (six) hours as needed for wheezing or shortness of breath. 1 Inhaler 2 Past Week at Unknown time  . amLODipine (NORVASC) 10 MG tablet Take 1 tablet (10 mg total) by mouth daily. 90 tablet 3 04/14/2017 at Unknown time  . atorvastatin (LIPITOR) 80 MG tablet Take 1 tablet (80 mg total) by mouth daily at 6 PM. 90 tablet 3 04/14/2017 at Unknown time  . dexamethasone (DECADRON) 6 MG tablet Take 6 mg by mouth 2 (two) times daily.   04/14/2017 at Unknown time  . folic acid (FOLVITE) 1 MG tablet Take 1 mg by mouth daily.   04/14/2017 at Unknown time  . LORazepam (ATIVAN) 0.5 MG tablet Qd to bid prn 60 tablet 0 04/13/2017 at Unknown time  . oxybutynin (DITROPAN-XL) 5 MG 24 hr tablet Take 1 tablet (5 mg total) by mouth at bedtime. 30 tablet 1 04/13/2017 at Unknown time  . pantoprazole (PROTONIX) 40 MG tablet Take 40 mg by mouth daily.   04/14/2017 at Unknown time  . traZODone (DESYREL)  50 MG tablet Take 50 mg by mouth at bedtime as needed for sleep.    04/13/2017 at Unknown time  . aspirin EC 81 MG tablet Take 81 mg by mouth daily.   04/13/2017 at Unknown time  . lidocaine-prilocaine (EMLA) cream Apply to affected area once 30 g 3   . ondansetron (ZOFRAN) 8 MG tablet Take 1 tablet (8 mg total) by mouth 2 (two) times daily as needed for refractory nausea / vomiting. (Patient not taking: Reported on 04/13/2017) 30 tablet 2 Not Taking at Unknown time  . prochlorperazine (COMPAZINE) 10 MG tablet Take 1 tablet (10 mg total) by mouth every 6 (six) hours as needed (Nausea or vomiting). (Patient not taking: Reported on 04/13/2017) 60 tablet 2 Not Taking at Unknown time   Scheduled: . amLODipine  10 mg Oral Daily  .  aspirin EC  81 mg Oral Daily  . atorvastatin  80 mg Oral q1800  . dexamethasone  10 mg Intravenous Q12H  . enoxaparin (LOVENOX) injection  40 mg Subcutaneous Q24H  . pantoprazole  40 mg Oral Daily    ROS: Unable to provide due to lethargy  Physical Examination: Blood pressure (!) 186/72, pulse 61, temperature 97.7 F (36.5 C), temperature source Oral, resp. rate 18, height 5\' 7"  (1.702 m), weight 104.3 kg (230 lb), SpO2 96 %.  HEENT-  Normocephalic, no lesions, without obvious abnormality.  Normal external eye and conjunctiva.  Normal TM's bilaterally.  Normal auditory canals and external ears. Normal external nose, mucus membranes and septum.  Normal pharynx. Cardiovascular- S1, S2 normal, pulses palpable throughout   Lungs- chest clear, no wheezing, rales, normal symmetric air entry Abdomen- soft, non-tender; bowel sounds normal; no masses,  no organomegaly Extremities- no edema Lymph-no adenopathy palpable Musculoskeletal-no joint tenderness, deformity or swelling Skin-warm and dry, no hyperpigmentation, vitiligo, or suspicious lesions  Neurological Examination   Mental Status: Lethargic but awakened with a light sternal rub.  Responds to questions being asked.  Is able to recognize visitors and able to follow simple commands.   Cranial Nerves: II: Discs flat bilaterally; Blinks to bilateral confrontation, pupils equal, round, reactive to light and accommodation III,IV, VI: ptosis not present, extra-ocular motions intact bilaterally V,VII: decrease in right NLF, facial light touch sensation normal bilaterally VIII: hearing normal bilaterally IX,X: gag reflex present XI: bilateral shoulder shrug XII: midline tongue extension Motor: Right : Upper extremity   5-/5    Left:     Upper extremity   5/5  Lower extremity   3-/5     Lower extremity   Able to lift off the bed and maintain Tone and bulk:normal tone throughout; no atrophy noted Sensory: Pinprick and light touch intact  throughout, bilaterally Deep Tendon Reflexes: 2+ in the upper extremities and absent in the lower extremities Plantars: Right: upgoing   Left: downgoing Cerebellar: Unable to perform secondary to lethargy Gait: not tested due to safety concerns   Laboratory Studies:   Basic Metabolic Panel: Recent Labs  Lab 04/14/17 1335 04/15/17 0445  NA 131* 132*  K 4.7 4.9  CL 100* 100*  CO2 24 25  GLUCOSE 135* 139*  BUN 35* 38*  CREATININE 1.16 1.03  CALCIUM 8.1* 7.8*    Liver Function Tests: No results for input(s): AST, ALT, ALKPHOS, BILITOT, PROT, ALBUMIN in the last 168 hours. No results for input(s): LIPASE, AMYLASE in the last 168 hours. No results for input(s): AMMONIA in the last 168 hours.  CBC: Recent Labs  Lab 04/14/17 1335 04/14/17  2003 04/15/17 0445  WBC 12.2* 16.1* 11.4*  NEUTROABS 11.1*  --   --   HGB 14.6 15.8 13.6  HCT 45.4 47.3 40.8  MCV 88.3 86.5 87.1  PLT 154 166 124*    Cardiac Enzymes: No results for input(s): CKTOTAL, CKMB, CKMBINDEX, TROPONINI in the last 168 hours.  BNP: Invalid input(s): POCBNP  CBG: No results for input(s): GLUCAP in the last 168 hours.  Microbiology: Results for orders placed or performed during the hospital encounter of 06/15/15  Surgical pcr screen     Status: None   Collection Time: 06/15/15  8:43 AM  Result Value Ref Range Status   MRSA, PCR NEGATIVE NEGATIVE Final   Staphylococcus aureus NEGATIVE NEGATIVE Final    Comment:        The Xpert SA Assay (FDA approved for NASAL specimens in patients over 39 years of age), is one component of a comprehensive surveillance program.  Test performance has been validated by [facility] for patients greater than or equal to 54 year old. It is not intended to diagnose infection nor to guide or monitor treatment.     Coagulation Studies: Recent Labs    04/14/17 1335  LABPROT 12.1  INR 0.90    Urinalysis:  Recent Labs  Lab 04/14/17 2003  COLORURINE STRAW*   LABSPEC 1.013  PHURINE 7.0  GLUCOSEU 50*  HGBUR MODERATE*  BILIRUBINUR NEGATIVE  KETONESUR NEGATIVE  PROTEINUR 30*  NITRITE NEGATIVE  LEUKOCYTESUR NEGATIVE    Lipid Panel:     Component Value Date/Time   CHOL 127 10/17/2016 0854   TRIG 72.0 10/17/2016 0854   HDL 37.50 (L) 10/17/2016 0854   CHOLHDL 3 10/17/2016 0854   VLDL 14.4 10/17/2016 0854   LDLCALC 75 10/17/2016 0854    HgbA1C:  Lab Results  Component Value Date   HGBA1C 5.8 10/17/2016    Urine Drug Screen:      Component Value Date/Time   LABOPIA NONE DETECTED 05/21/2015 2046   COCAINSCRNUR NONE DETECTED 05/21/2015 2046   LABBENZ NONE DETECTED 05/21/2015 2046   AMPHETMU NONE DETECTED 05/21/2015 2046   THCU NONE DETECTED 05/21/2015 2046   LABBARB NONE DETECTED 05/21/2015 2046    Alcohol Level: No results for input(s): ETH in the last 168 hours.  Other results: EKG: sinus rhythm at 64 bpm.  Imaging: Ct Head Wo Contrast  Result Date: 04/14/2017 CLINICAL DATA:  Headache. Metastatic lung cancer. Rule out subarachnoid hemorrhage EXAM: CT HEAD WITHOUT CONTRAST TECHNIQUE: Contiguous axial images were obtained from the base of the skull through the vertex without intravenous contrast. COMPARISON:  MRI head 03/26/2017 FINDINGS: Brain: Mass lesion left lateral cerebellum measures 36 x 42 mm similar in size to the prior study. This has mixed cystic and solid component. Increased mass-effect on the fourth ventricle which is displaced to the right. No obstructive hydrocephalus. Left parietal convexity hemorrhagic metastatic deposit measures 28 x 22 mm and is increased in size. Surrounding edema has improved. No new mass lesions identified. Generalized atrophy. Chronic infarcts in the left cerebral white matter and left basal ganglia. Negative for acute infarct. Vascular: Negative for hyperdense vessel Skull: Negative Sinuses/Orbits: Air-fluid level right maxillary sinus Other: None IMPRESSION: Metastatic lesions in the left  cerebellum and left parietal convexity with interval enlargement since recent studies compatible with progression of tumor. Progressive mass-effect on the fourth ventricle without obstructive hydrocephalus. Improvement in vasogenic edema in the left parietal lobe likely related to interval steroid treatment. Chronic infarct left basal ganglia and deep white  matter. No acute infarct. Electronically Signed   By: Franchot Gallo M.D.   On: 04/14/2017 14:28     Assessment/Plan: 71 year old male with lung CA metastatic to the brain.  On steroids, noted to have a GTC seizure.  Patient started on Keppra.  Has been agitated and lethargic.  Unclear at this time whether this is a prolonged post-ictal phenomenon or whether the patient may be having side effects to Keppra.  Head CT reviewed and shows enlarging metastatic lesions.  Encroachment on the 4th ventricle without obstruction.  No evidence of hydrocephalus.  Natural course of progression discussed with daughter.  Patient now awake and following commands.  Do not suspect subclinical seizure activity.    Recommendations: 1.  Would load with Depakote 1000mg  IV and maintenance of 500mg  BID.  Once therapeutic level achieved would discontinue Keppra. 2.  Depakote level in AM 3.  Seizure precautions 4.  Continue steroids.   5.  EEG not indicated at this time  Alexis Goodell, MD Neurology [phone] 04/15/2017, 11:01 AM

## 2017-04-15 NOTE — Progress Notes (Signed)
PT Cancellation Note  Patient Details Name: Jonathan Bowen MRN: [id number] DOB: 11-Nov-1945   Cancelled Treatment:    Reason Eval/Treat Not Completed: Other (comment).  PT consult received, chart reviewed.  Per discussion with nursing, will hold PT today d/t pt's agitation.  Will re-attempt PT evaluation at a later date/time as medically appropriate.  Leitha Bleak, PT 04/15/17, 2:41 PM [phone]

## 2017-04-15 NOTE — Progress Notes (Signed)
Patient ID: Jonathan Bowen, male   DOB: 04-Nov-1945, 71 y.o.   MRN: [id number]  [employer] PROGRESS NOTE  Jonathan Bowen NAT:[id number] DOB: 05-01-1945 DOA: 04/14/2017 PCP: Jonathan Haven, MD  HPI/Subjective: Patient had a prolonged ictal period and was unresponsive most of the night.  When I saw him this morning he was able to answer a few yes or no questions.  He was moving his left side around.  When I asked him to move his right side he was able to do it a little bit but still very weak over there.  In speaking with the patient's daughter, she stated that the patient was considering not doing any treatments for his cancer.  I mentioned if he decides that then he would be a candidate for hospice.  Objective: Vitals:   04/15/17 0750 04/15/17 0757  BP: (!) 186/72   Pulse: 61   Resp:    Temp: 97.7 F (36.5 C)   SpO2:  96%    Filed Weights   04/14/17 1853  Weight: 104.3 kg (230 lb)    ROS: Review of Systems  Unable to perform ROS: Mental acuity  Respiratory: Negative for shortness of breath.   Cardiovascular: Negative for chest pain.  Gastrointestinal: Negative for abdominal pain.   Exam: Physical Exam  HENT:  Nose: No mucosal edema.  Mouth/Throat: No oropharyngeal exudate or posterior oropharyngeal edema.  Eyes: Conjunctivae and lids are normal. Pupils are equal, round, and reactive to light.  Neck: No JVD present. Carotid bruit is not present. No edema present. No thyroid mass and no thyromegaly present.  Cardiovascular: S1 normal and S2 normal. Exam reveals no gallop.  No murmur heard. Pulses:      Dorsalis pedis pulses are 2+ on the right side, and 2+ on the left side.  Respiratory: No respiratory distress. He has no wheezes. He has no rhonchi. He has no rales.  GI: Soft. Bowel sounds are normal. There is no tenderness.  Musculoskeletal:       Right ankle: He exhibits no swelling.       Left ankle: He exhibits no swelling.  Lymphadenopathy:    He has no cervical  adenopathy.  Neurological: He is alert.  Patient moving his left side on his own.  4 out of 5 power.  Right side I had to ask him to move.  He was able to move his right side with 3+ out of 5 power right side.  Skin: Skin is warm. No rash noted. Nails show no clubbing.  Psychiatric:  Patient able to answer a few yes or no questions      Data Reviewed: Basic Metabolic Panel: Recent Labs  Lab 04/14/17 1335 04/15/17 0445  NA 131* 132*  K 4.7 4.9  CL 100* 100*  CO2 24 25  GLUCOSE 135* 139*  BUN 35* 38*  CREATININE 1.16 1.03  CALCIUM 8.1* 7.8*   Liver Function Tests: No results for input(s): AST, ALT, ALKPHOS, BILITOT, PROT, ALBUMIN in the last 168 hours. No results for input(s): LIPASE, AMYLASE in the last 168 hours. No results for input(s): AMMONIA in the last 168 hours. CBC: Recent Labs  Lab 04/14/17 1335 04/14/17 2003 04/15/17 0445  WBC 12.2* 16.1* 11.4*  NEUTROABS 11.1*  --   --   HGB 14.6 15.8 13.6  HCT 45.4 47.3 40.8  MCV 88.3 86.5 87.1  PLT 154 166 124*     Studies: Ct Head Wo Contrast  Result Date: 04/14/2017 CLINICAL DATA:  Headache. Metastatic  lung cancer. Rule out subarachnoid hemorrhage EXAM: CT HEAD WITHOUT CONTRAST TECHNIQUE: Contiguous axial images were obtained from the base of the skull through the vertex without intravenous contrast. COMPARISON:  MRI head 03/26/2017 FINDINGS: Brain: Mass lesion left lateral cerebellum measures 36 x 42 mm similar in size to the prior study. This has mixed cystic and solid component. Increased mass-effect on the fourth ventricle which is displaced to the right. No obstructive hydrocephalus. Left parietal convexity hemorrhagic metastatic deposit measures 28 x 22 mm and is increased in size. Surrounding edema has improved. No new mass lesions identified. Generalized atrophy. Chronic infarcts in the left cerebral white matter and left basal ganglia. Negative for acute infarct. Vascular: Negative for hyperdense vessel Skull:  Negative Sinuses/Orbits: Air-fluid level right maxillary sinus Other: None IMPRESSION: Metastatic lesions in the left cerebellum and left parietal convexity with interval enlargement since recent studies compatible with progression of tumor. Progressive mass-effect on the fourth ventricle without obstructive hydrocephalus. Improvement in vasogenic edema in the left parietal lobe likely related to interval steroid treatment. Chronic infarct left basal ganglia and deep white matter. No acute infarct. Electronically Signed   By: Franchot Gallo M.D.   On: 04/14/2017 14:28    Scheduled Meds: . amLODipine  10 mg Oral Daily  . aspirin EC  81 mg Oral Daily  . atorvastatin  80 mg Oral q1800  . dexamethasone  10 mg Intravenous Q12H  . enoxaparin (LOVENOX) injection  40 mg Subcutaneous Q24H  . oxybutynin  5 mg Oral QHS  . pantoprazole  40 mg Oral Daily   Continuous Infusions: . levETIRAcetam      Assessment/Plan:  1. Seizure with prolonged postictal period.  Patient has a history of brain metastases.  Patient was placed on IV Keppra.  Patient is slow to come around but is starting to answer some questions.  EEG unavailable on the weekend.  Neuro consultation.  Nursing staff to see if he is able to swallow some applesauce and may be able to put on a diet later today depending on clinical status. 2. 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  As per daughter, the patient may not want treatment.  If this is the case he would be a candidate for hospice.  Switch Decadron to IV.  Patient is a DNR.  Overall prognosis poor. 3. Accelerated hypertension.  Restart Norvasc orally 4. History of stroke on aspirin and Lipitor 5. Hyperlipidemia unspecified on atorvastatin 6. GERD on Protonix 7. Get rid of oxybutynin 8. Weakness physical therapy evaluation 9. Switch to inpatient status  Code Status:     Code  Status Orders  (From admission, onward)        Start     Ordered   04/15/17 0119  Do not attempt resuscitation (DNR)  Continuous    Question Answer Comment  In the event of cardiac or respiratory ARREST Do not call a "code blue"   In the event of cardiac or respiratory ARREST Do not perform Intubation, CPR, defibrillation or ACLS   In the event of cardiac or respiratory ARREST Use medication by any route, position, wound care, and other measures to relive pain and suffering. May use oxygen, suction and manual treatment of airway obstruction as needed for comfort.      04/15/17 0118    Code Status History    Date Active Date Inactive Code Status Order ID Comments User  Context   04/15/2017 00:25 04/15/2017 01:18 Full Code [id number]  Lance Coon, MD Inpatient   05/21/2015 15:07 05/24/2015 20:23 Full Code [id number]  Francesca Oman, DO Inpatient    Advance Directive Documentation     Most Recent Value  Type of Advance Directive  Healthcare Power of Attorney, Living will  Pre-existing out of facility DNR order (yellow form or pink MOST form)  No data  "MOST" Form in Place?  No data     Family Communication: Spoke with daughter on phone Disposition Plan: To be determined based on clinical status  Consultants: -Neuro  Time spent: 30 minutes in coordination of care  [employer]

## 2017-04-15 NOTE — Progress Notes (Addendum)
Pt has remained unresponsive throughout this shift.  Telesitter in place.  Family concerns that when he wakes up, he will be angry, agitated and impulsive.  Said nurse reassured family that between telesitter, sensitive bed alarm and frequent rounding, he would be well attended and safe.  A 1:1 safety sitter may be warranted if this happens.   Family also wished to put DNR in place, as this is his father's wishes.  Said nurse and Mariane Baumgarten RN verified the verbal order with Lance Coon MD and paperwork is placed in chart.

## 2017-04-16 ENCOUNTER — Telehealth: Payer: Self-pay

## 2017-04-16 ENCOUNTER — Other Ambulatory Visit: Payer: Self-pay

## 2017-04-16 DIAGNOSIS — R569 Unspecified convulsions: Secondary | ICD-10-CM

## 2017-04-16 DIAGNOSIS — Z7189 Other specified counseling: Secondary | ICD-10-CM

## 2017-04-16 DIAGNOSIS — R55 Syncope and collapse: Secondary | ICD-10-CM

## 2017-04-16 DIAGNOSIS — Z515 Encounter for palliative care: Secondary | ICD-10-CM

## 2017-04-16 LAB — BASIC METABOLIC PANEL
Anion gap: 8 (ref 5–15)
BUN: 43 mg/dL — AB (ref 6–20)
CO2: 23 mmol/L (ref 22–32)
CREATININE: 1.02 mg/dL (ref 0.61–1.24)
Calcium: 8 mg/dL — ABNORMAL LOW (ref 8.9–10.3)
Chloride: 102 mmol/L (ref 101–111)
GFR calc Af Amer: >60 mL/min (ref >60)
GLUCOSE: 111 mg/dL — AB (ref 65–99)
POTASSIUM: 4.9 mmol/L (ref 3.5–5.1)
SODIUM: 133 mmol/L — AB (ref 135–145)

## 2017-04-16 LAB — URINE CULTURE: Culture: NO GROWTH

## 2017-04-16 LAB — VALPROIC ACID LEVEL: VALPROIC ACID LVL: 40 ug/mL — AB (ref 50.0–100.0)

## 2017-04-16 MED ORDER — VALPROATE SODIUM 500 MG/5ML IV SOLN
750.0000 mg | Freq: Two times a day (BID) | INTRAVENOUS | Status: DC
  Administered 2017-04-16 – 2017-04-17 (×2): 750 mg via INTRAVENOUS
  Filled 2017-04-16 (×3): qty 7.5

## 2017-04-16 MED ORDER — SODIUM CHLORIDE 0.9 % IV SOLN
500.0000 mg | Freq: Two times a day (BID) | INTRAVENOUS | Status: DC
  Administered 2017-04-16 (×2): 500 mg via INTRAVENOUS
  Filled 2017-04-16 (×4): qty 5

## 2017-04-16 MED ORDER — LORAZEPAM 2 MG/ML IJ SOLN
1.0000 mg | Freq: Once | INTRAMUSCULAR | Status: AC
  Administered 2017-04-16: 1 mg via INTRAVENOUS
  Filled 2017-04-16: qty 1

## 2017-04-16 MED ORDER — BUDESONIDE 0.5 MG/2ML IN SUSP
0.5000 mg | Freq: Two times a day (BID) | RESPIRATORY_TRACT | Status: DC
  Administered 2017-04-16: 0.5 mg via RESPIRATORY_TRACT
  Filled 2017-04-16 (×4): qty 2

## 2017-04-16 MED ORDER — IPRATROPIUM-ALBUTEROL 0.5-2.5 (3) MG/3ML IN SOLN
3.0000 mL | Freq: Four times a day (QID) | RESPIRATORY_TRACT | Status: DC
  Administered 2017-04-16: 20:00:00 3 mL via RESPIRATORY_TRACT
  Filled 2017-04-16 (×4): qty 3

## 2017-04-16 NOTE — Evaluation (Signed)
{ Clinical/Bedside Swallow Evaluation Patient Details  Name: Jonathan Bowen MRN: [id number] Date of Birth: December 17, 1945  Today's Date: 04/16/2017 Time: SLP Start Time (ACUTE ONLY): 3762 SLP Stop Time (ACUTE ONLY): 0838 SLP Time Calculation (min) (ACUTE ONLY): 26 min  Past Medical History:  Past Medical History:  Diagnosis Date  . Agitation   . Complication of anesthesia    has never had surgery  . Dementia 2018  . Hyperlipidemia   . Hypertension   . [place] cell lung cancer [place])    with brain mets 03/2017   . Smoker   . Stroke [facility])    just happened in early February  . Urinary incontinence    Past Surgical History:  Past Surgical History:  Procedure Laterality Date  . ENDARTERECTOMY Right 06/18/2015   Procedure: ENDARTERECTOMY RIGHT CAROTID;  Surgeon: Rosetta Posner, MD;  Location: [place];  Service: Vascular;  Laterality: Right;  . PATCH ANGIOPLASTY Right 06/18/2015   Procedure: PATCH ANGIOPLASTY RIGHT CAROTID ARTERY;  Surgeon: Rosetta Posner, MD;  Location: [place];  Service: Vascular;  Laterality: Right;  . PORTA CATH INSERTION N/A 04/13/2017   Procedure: PORTA CATH INSERTION;  Surgeon: Katha Cabal, MD;  Location: [place] [facility];  Service: Cardiovascular;  Laterality: N/A;   HPI:      Assessment / Plan / Recommendation Clinical Impression  pt presents with a minimal risk for aspiration as characterized by oral pocketing when lethargic. pt was given medication on 04/15/2017 that limited his ability to remain alert throughout meal and increased his oral pocketing. Pt should also only have oral intake presented when fully alert and unmedicated. pt intake of solids and liquids during evaluation was [facility] and no overt ssx aspiration were noted. SLP educated pt on necessity to sit at 90 degrees or as close as able to ensure safe swallow. SLP reviewed safe swallow strategies and recommendation  for a regular diet. Order placed and morning meal called.  SLP Visit Diagnosis: Dysphagia,  oropharyngeal phase (R13.12)    Aspiration Risk  Mild aspiration risk    Diet Recommendation Regular;Thin liquid   Liquid Administration via: Cup;Straw Medication Administration: Whole meds with liquid Supervision: Patient able to self feed Compensations: Slow rate;Small sips/bites;Follow solids with liquid Postural Changes: Seated upright at 90 degrees    Other  Recommendations Oral Care Recommendations: Oral care BID   Follow up Recommendations None      Frequency and Duration min 3x week  1 week       Prognosis Prognosis for Safe Diet Advancement: Good      Swallow Study   General Date of Onset: 04/16/17 Type of Study: Bedside Swallow Evaluation Diet Prior to this Study: NPO Respiratory Status: Room air History of Recent Intubation: No Behavior/Cognition: Alert;Cooperative Oral Cavity Assessment: Within Functional Limits Oral Care Completed by SLP: No Oral Cavity - Dentition: Adequate natural dentition Vision: Functional for self-feeding Self-Feeding Abilities: Needs assist Patient Positioning: Upright in bed Baseline Vocal Quality: Normal Volitional Cough: Strong Volitional Swallow: Able to elicit    Oral/Motor/Sensory Function Overall Oral Motor/Sensory Function: Within functional limits   Ice Chips Ice chips: Within functional limits Presentation: Spoon   Thin Liquid Thin Liquid: Within functional limits Presentation: Cup;Spoon;Self Fed;Straw    Nectar Thick Nectar Thick Liquid: Not tested   Honey Thick Honey Thick Liquid: Not tested   Puree Puree: Within functional limits Presentation: Spoon   Solid   GO   Solid: Within functional limits Presentation: Spoon    Functional Limitations:  Swallowing Swallow Current Status [phone]): At least 1 percent but less than 20 percent impaired, limited or restricted Swallow Goal Status ([id number]): 0 percent impaired, limited or restricted   [employer] 04/16/2017,10:36 AM

## 2017-04-16 NOTE — Progress Notes (Deleted)
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  INTERVAL HISTORY: Patient is a 71 year old male who was initially worked up at [facility] and found to have an isolated lung lesion that is biopsy-proven small cell lung cancer.  He was also noted to have multiple brain metastasis.  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  He denies any neurologic complaints.  There is no report of fevers.  He has no chest pain or shortness of breath.  He denies any nausea, vomiting, constipation, or diarrhea.  He has a good appetite.  He has no urinary complaints.  Patient offers no specific complaints today.  REVIEW OF SYSTEMS:   Review of Systems  Constitutional: Negative.  Negative for fever, malaise/fatigue and weight loss.  Respiratory: Negative.  Negative for cough and shortness of breath.   Cardiovascular: Negative.  Negative for chest pain and leg swelling.  Gastrointestinal: Negative.  Negative for abdominal pain.  Genitourinary: Negative.   Musculoskeletal: Negative.   Skin: Negative.  Negative for rash.  Neurological: Negative.  Negative for sensory change, focal weakness, seizures, weakness and headaches.  Psychiatric/Behavioral: Positive for memory loss.    As per HPI. Otherwise, a complete review of systems is negative.  PAST MEDICAL HISTORY: Past Medical History:  Diagnosis Date  . Agitation   . Complication of anesthesia    has never had surgery  . Dementia 2018  . Hyperlipidemia   . Hypertension   . Small cell lung cancer ([facility])    with brain mets 03/2017   . Smoker   . Stroke [facility])    just happened in early February  . Urinary  incontinence     PAST SURGICAL HISTORY: Past Surgical History:  Procedure Laterality Date  . ENDARTERECTOMY Right 06/18/2015   Procedure: ENDARTERECTOMY RIGHT CAROTID;  Surgeon: Rosetta Posner, MD;  Location: [place];  Service: Vascular;  Laterality: Right;  . PATCH ANGIOPLASTY Right 06/18/2015   Procedure: PATCH ANGIOPLASTY RIGHT CAROTID ARTERY;  Surgeon: Rosetta Posner, MD;  Location: [place];  Service: Vascular;  Laterality: Right;  . PORTA CATH INSERTION N/A 04/13/2017   Procedure: PORTA CATH INSERTION;  Surgeon: Katha Cabal, MD;  Location: [place] [facility];  Service: Cardiovascular;  Laterality: N/A;    FAMILY HISTORY: Family History  Problem Relation Age of Onset  . Diabetes Mother   . Cancer Father        Unknown of the type of cancer   . Diabetes Sister   . ALS Brother     ADVANCED DIRECTIVES (Y/N):  N  HEALTH MAINTENANCE: Social History   Tobacco Use  . Smoking status: Former Smoker    Packs/day: 3.00    Years: 50.00    Pack years: 150.00    Last attempt to quit: 05/28/2015    Years since quitting: 1.8  . Smokeless tobacco: Never Used  Substance Use Topics  . Alcohol use: No  . Drug use: No     Colonoscopy:  PAP:  Bone density:  Lipid panel:  No Known Allergies  No current facility-administered medications for this visit.    No current outpatient medications on file.   Facility-Administered Medications Ordered in Other Visits  Medication Dose Route Frequency Provider Last Rate Last Dose  . acetaminophen (TYLENOL) tablet 650 mg  650 mg Oral Q6H PRN Lance Coon, MD   650 mg at 04/16/17 1062   Or  . acetaminophen (TYLENOL) suppository 650 mg  650 mg Rectal Q6H PRN Lance Coon, MD      . amLODipine (NORVASC) tablet 10 mg  10 mg Oral Daily Lance Coon, MD   10 mg at 04/16/17 0951  . aspirin EC tablet 81 mg  81 mg Oral Daily Lance Coon, MD   81 mg at 04/16/17 0946  . atorvastatin (LIPITOR) tablet 80 mg  80 mg Oral q1800 Lance Coon, MD      .  budesonide (PULMICORT) nebulizer solution 0.5 mg  0.5 mg Nebulization BID Wieting, Richard, MD      . dexamethasone (DECADRON) injection 10 mg  10 mg Intravenous Q12H Loletha Grayer, MD   10 mg at 04/16/17 0946  . enoxaparin (LOVENOX) injection 40 mg  40 mg Subcutaneous Q24H Lance Coon, MD   40 mg at 04/15/17 2105  . haloperidol lactate (HALDOL) injection 1 mg  1 mg Intravenous Q6H PRN Loletha Grayer, MD   1 mg at 04/16/17 1546  . hydrALAZINE (APRESOLINE) injection 10 mg  10 mg Intravenous Q4H PRN Lance Coon, MD   10 mg at 04/15/17 1353  . hydrochlorothiazide (MICROZIDE) capsule 12.5 mg  12.5 mg Oral Daily Loletha Grayer, MD   12.5 mg at 04/16/17 0946  . ipratropium-albuterol (DUONEB) 0.5-2.5 (3) MG/3ML nebulizer solution 3 mL  3 mL Nebulization Q6H Wieting, Richard, MD      . levETIRAcetam (KEPPRA) 500 mg in sodium chloride 0.9 % 100 mL IVPB  500 mg Intravenous Q12H Alexis Goodell, MD   Stopped at 04/16/17 1410  . LORazepam (ATIVAN) injection 0.5 mg  0.5 mg Intravenous Q6H PRN Lance Coon, MD      . LORazepam (ATIVAN) injection 2 mg  2 mg Intravenous Q4H PRN Lance Coon, MD   2 mg at 04/15/17 1537  . LORazepam (ATIVAN) tablet 0.5 mg  0.5 mg Oral Q6H PRN Lance Coon, MD   0.5 mg at 04/15/17 0840  . ondansetron (ZOFRAN) tablet 4 mg  4 mg Oral Q6H PRN Lance Coon, MD       Or  . ondansetron [facility]) injection 4 mg  4 mg Intravenous Q6H PRN Lance Coon, MD      . oxyCODONE (Oxy IR/ROXICODONE) immediate release tablet 5 mg  5 mg Oral Q4H PRN Lance Coon, MD      . pantoprazole (PROTONIX) EC tablet 40 mg  40 mg Oral Daily Lance Coon, MD   40 mg at 04/16/17 0946  . traZODone (DESYREL) tablet 50 mg  50 mg Oral QHS PRN Lance Coon, MD      . valproate (DEPACON) 750 mg in dextrose 5 % 50 mL IVPB  750 mg Intravenous Q12H Alexis Goodell, MD        OBJECTIVE: There were no vitals filed for this visit.   There is no height or weight on file to calculate BMI.    ECOG FS:0 -  Asymptomatic  General: Well-developed, well-nourished, no acute distress. Eyes: Pink conjunctiva, anicteric sclera. HEENT: Normocephalic, moist mucous membranes, clear oropharnyx. Lungs: Clear to auscultation bilaterally. Heart: Regular rate and rhythm. No rubs, murmurs, or gallops. Abdomen: Soft, nontender, nondistended. No organomegaly noted, normoactive bowel sounds. Musculoskeletal: No edema, cyanosis, or clubbing. Neuro: Confused but alert. Cranial nerves grossly intact. Skin: No rashes or petechiae noted.  Psych: Normal affect. Lymphatics: No cervical, calvicular, axillary or inguinal LAD.   LAB RESULTS:  Lab Results  Component Value Date   NA 133 (L) 04/16/2017   K 4.9 04/16/2017   CL 102 04/16/2017   CO2 23 04/16/2017   GLUCOSE 111 (H) 04/16/2017   BUN 43 (H) 04/16/2017   CREATININE 1.02 04/16/2017   CALCIUM 8.0 (L) 04/16/2017   PROT 6.3 (L) 03/30/2017   ALBUMIN 3.6 03/30/2017   AST 17 03/30/2017   ALT 30 03/30/2017   ALKPHOS 65 03/30/2017   BILITOT 0.32 03/30/2017   GFRNONAA >60 04/16/2017   GFRAA >60 04/16/2017    Lab Results  Component Value Date   WBC 11.4 (H) 04/15/2017   NEUTROABS 11.1 (H) 04/14/2017   HGB 13.6 04/15/2017   HCT 40.8 04/15/2017   MCV 87.1 04/15/2017   PLT 124 (L) 04/15/2017     STUDIES: Ct Head Wo Contrast  Result Date: 04/14/2017 CLINICAL DATA:  Headache. Metastatic lung cancer. Rule out subarachnoid hemorrhage EXAM: CT HEAD WITHOUT CONTRAST TECHNIQUE: Contiguous axial images were obtained from the base of the skull through the vertex without intravenous contrast. COMPARISON:  MRI head 03/26/2017 FINDINGS: Brain: Mass lesion left lateral cerebellum measures 36 x 42 mm similar in size to the prior study. This has mixed cystic and solid component. Increased mass-effect on the fourth ventricle which is displaced to the right. No obstructive hydrocephalus. Left parietal convexity hemorrhagic metastatic deposit measures 28 x 22 mm and is  increased in size. Surrounding edema has improved. No new mass lesions identified. Generalized atrophy. Chronic infarcts in the left cerebral white matter and left basal ganglia. Negative for acute infarct. Vascular: Negative for hyperdense vessel Skull: Negative Sinuses/Orbits: Air-fluid level right maxillary sinus Other: None IMPRESSION: Metastatic lesions in the left cerebellum and left parietal convexity with interval enlargement since recent studies compatible with progression of tumor. Progressive mass-effect on the fourth ventricle without obstructive hydrocephalus. Improvement in vasogenic edema in the left parietal lobe likely related to interval steroid treatment. Chronic infarct left basal ganglia and deep white matter. No acute infarct. Electronically Signed   By: Franchot Gallo M.D.   On: 04/14/2017 14:28   Ct Head Wo Contrast  Result Date: 03/21/2017 CLINICAL DATA:  Slurred speech EXAM: CT HEAD WITHOUT CONTRAST TECHNIQUE: Contiguous axial images were obtained from the base of the skull through the vertex without intravenous contrast. COMPARISON:  CT 05/21/2015.  MRI to 05/22/2015. FINDINGS: Brain: Abnormal low-density noted within the left cerebellar hemisphere, new since prior studies. There is also new low-density throughout the deep white matter within the left parietal lobe, surrounding what appears to be a focal lesion in the posterior parietal lobe measuring 2.3 x 2.3 cm. Findings are concerning for metastases in these 2 areas. Changes of old left MCA infarct in the basal ganglia and periventricular white matter on the left. No hemorrhage or hydrocephalus. Vascular: No hyperdense vessel or unexpected calcification. Skull: No acute calvarial abnormality. Sinuses/Orbits: Visualized paranasal sinuses and mastoids clear. Orbital soft tissues unremarkable. Other: None IMPRESSION: Edema noted within the left parietal lobe around a 2.3 cm mass lesion. There is also edema within the left cerebellar  hemisphere, likely also related to an underlying mass lesion. Findings concerning for metastases. These could be further evaluated with MRI with and without contrast. These results were called by telephone at the time of interpretation on 03/21/2017 at 10:52 am to Dr. Carrie Mew , who verbally acknowledged these results. Electronically Signed   By: Lennette Bihari  [place] M.D.   On: 03/21/2017 10:53   Nm Pet Image Initial (pi) Skull Base To Thigh  Result Date: 04/03/2017 CLINICAL DATA:  Initial treatment strategy for small cell carcinoma of the lung, metastatic to the brain EXAM: NUCLEAR MEDICINE PET SKULL BASE TO THIGH TECHNIQUE: 13.3 mCi F-18 FDG was injected intravenously. Full-ring PET imaging was performed from the skull base to thigh after the radiotracer. CT data was obtained and used for attenuation correction and anatomic localization. FASTING BLOOD GLUCOSE:  Value: 113 mg/dl COMPARISON:  Multiple exams, including 03/22/2017 FINDINGS: NECK AND HEAD Primarily hypermetabolic mass of the left parietal lobe with surrounding vasogenic edema, maximum SUV 10.4. Cystic and solid mass of the left lateral cerebellum, maximum SUV in the solid elements 8.8. Asymmetric hypoactivity in the left lentiform nuclei due to prior infarcts. The smaller potential lesion in the right frontal lobe is not appreciably hypermetabolic but is likely below sensitive PET-CT size thresholds. 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 No hypermetabolic adenopathy in the neck or other abnormal metabolic activity in the neck. Mild chronic left maxillary sinusitis. CHEST The dominant left lower lobe pulmonary nodule measures 2.4 by 2.1 cm on image 136/3, and has a maximum standard uptake value of 7.5. Bilateral airway thickening especially in the lower lobes. No hypermetabolic or pathologically enlarged adenopathy observed in the chest. Atherosclerotic calcification of the aortic arch and branch vessels. ABDOMEN/PELVIS No abnormal  hypermetabolic activity within the liver, pancreas, adrenal glands, or spleen. No hypermetabolic lymph nodes in the abdomen or pelvis. Focal accentuated activity at the level of the anal sphincter without appreciable CT abnormality, maximum SUV 7.1, most likely to be physiologic. Incidental fat density in the descending duodenum compatible with a small lipoma. Aortoiliac atherosclerotic vascular disease. Sigmoid colon diverticulosis. Curvilinear calcification in the prostate gland. No significant abnormal hypermetabolic activity in the vicinity of the 8 by 7 mm pancreatic head hypodense lesion. The 0.8 by 0.6 cm exophytic lesion from the right kidney lower pole is mildly hyperdense precontrast and is probably a complex cyst, but technically too small to characterize. SKELETON Mildly accentuated focal activity in the thoracic spine at the T5-6 level eccentric to the right, maximum SUV 3.8, without appreciable CT correlate. Activity at other vertebral levels is in the 3.4 range, hence this may simply be incidental or due to degenerative findings. IMPRESSION: 1. Hypermetabolic left lower lobe pulmonary nodule, maximum SUV 7.5, compatible with malignancy. 2. Hypermetabolic masses in the left parietal lobe and left cerebellum favoring metastatic lesions. A questionably enhancing lesion in the right frontal lobe is not appreciably hypermetabolic on PET-CT, but is probably below sensitive PET-CT size thresholds. 3. No findings of hypermetabolic adenopathy in the chest, or metastatic disease to the abdomen/pelvis. 4. Mildly accentuated focal activity eccentric to the right at the T5-6 level, barely above background activity level, probably due to spondylosis or incidental given that there is no correlate on the CT data. This area may warrant surveillance on follow up imaging. 5. Small hypodense lesion in the pancreatic head is not appreciably hypermetabolic, but may warrant surveillance. 6. Small complex lesion of the right  kidney demonstrates hyperdensity on the noncontrast CT data, and is statistically most likely to be a small benign complex cyst, but also warrant surveillance. 7. Other imaging findings of potential clinical significance: Mild chronic left maxillary sinusitis. Aortic Atherosclerosis (ICD10-I70.0). Sigmoid colon diverticulosis. Old left lentiform nucleus infarcts. Electronically Signed   By: Van Clines M.D.   On: 04/03/2017 16:28    ASSESSMENT: Stage  IV small cell lung cancer with brain metastasis.  PLAN:    1. 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  Hospice and end-of-life care were briefly discussed, but patient is not interested in pursuing this option at this time.  He will benefit from a palliative care outpatient consult.  Referral has been made to radiation oncology for consideration of XRT to his brain lesions.  We will also consider XRT concurrent with carboplatinum and etoposide for his isolated lung lesion.  Patient does not appear to have any mediastinal or hilar lymphadenopathy.  Will require port placement prior to initiating treatment.  Return to clinic on April 18, 2017 initiate cycle 1 of 4 of carboplatinum and etoposide. 2.  Brain metastasis: Referral to radiation oncology as above.  Outpatient palliative care consult.  Continue Decadron as prescribed.   Patient expressed understanding and was in agreement with this plan. He also understands that He can call clinic at any time with any questions, concerns, or complaints.   Cancer Staging Small cell lung cancer [facility]) Staging form: Lung, AJCC 8th Edition - Clinical stage from 04/06/2017: Stage IV (cT1, cN0, pM1c) - Signed by Lloyd Huger, MD on 04/06/2017   Lloyd Huger, MD   04/16/2017 4:11 PM

## 2017-04-16 NOTE — Progress Notes (Signed)
Subjective: Daughter reports that patient is more calm today.  Sitting in recliner.  Remains lethargic.    Objective: Current vital signs: BP 133/60 (BP Location: Left Arm)   Pulse 72   Temp 98.4 F (36.9 C) (Oral)   Resp 18   Ht 5\' 7"  (1.702 m)   Wt 104.3 kg (230 lb)   SpO2 93%   BMI 36.02 kg/m  Vital signs in last 24 hours: Temp:  [97.8 F (36.6 C)-98.4 F (36.9 C)] 98.4 F (36.9 C) (12/31 0500) Pulse Rate:  [68-73] 72 (12/31 0950) Resp:  [18] 18 (12/31 0950) BP: (107-180)/(57-75) 133/60 (12/31 0950) SpO2:  [86 %-95 %] 93 % (12/31 0500)  Intake/Output from previous day: 12/30 0701 - 12/31 0700 In: 796.7 [I.V.:471.7; IV Piggyback:325] Out: -  Intake/Output this shift: No intake/output data recorded. Nutritional status: Diet regular Room service appropriate? Yes; Fluid consistency: Thin  Neurologic Exam: Mental Status: Lethargic.  Easily alerted.  Speech slurred but fluent.  Follows simple commands.   Cranial Nerves: II: Discs flat bilaterally; Blinks to bilateral confrontation, pupils equal, round, reactive to light and accommodation III,IV, VI: ptosis not present, extra-ocular motions intact bilaterally V,VII: decrease in right NLF, facial light touch sensation normal bilaterally VIII: hearing normal bilaterally IX,X: gag reflex present XI: bilateral shoulder shrug XII: midline tongue extension Motor: Right :  Upper extremity   5-/5                                     Left:     Upper extremity   5/5             Lower extremity   4-/5                                                 Lower extremity   Able to lift off the bed and maintain   Lab Results: Basic Metabolic Panel: Recent Labs  Lab 04/14/17 1335 04/15/17 0445 04/16/17 0602  NA 131* 132* 133*  K 4.7 4.9 4.9  CL 100* 100* 102  CO2 24 25 23   GLUCOSE 135* 139* 111*  BUN 35* 38* 43*  CREATININE 1.16 1.03 1.02  CALCIUM 8.1* 7.8* 8.0*    Liver Function Tests: No results for input(s): AST, ALT,  ALKPHOS, BILITOT, PROT, ALBUMIN in the last 168 hours. No results for input(s): LIPASE, AMYLASE in the last 168 hours. No results for input(s): AMMONIA in the last 168 hours.  CBC: Recent Labs  Lab 04/14/17 1335 04/14/17 2003 04/15/17 0445  WBC 12.2* 16.1* 11.4*  NEUTROABS 11.1*  --   --   HGB 14.6 15.8 13.6  HCT 45.4 47.3 40.8  MCV 88.3 86.5 87.1  PLT 154 166 124*    Cardiac Enzymes: No results for input(s): CKTOTAL, CKMB, CKMBINDEX, TROPONINI in the last 168 hours.  Lipid Panel: No results for input(s): CHOL, TRIG, HDL, CHOLHDL, VLDL, LDLCALC in the last 168 hours.  CBG: No results for input(s): GLUCAP in the last 168 hours.  Microbiology: Results for orders placed or performed during the hospital encounter of 04/14/17  Urine Culture     Status: None   Collection Time: 04/14/17  8:03 PM  Result Value Ref Range Status   Specimen Description   Final  URINE, RANDOM Performed at [facility], [address], [place], [place] [address]    Special Requests   Final    NONE Performed at [facility], [address], [place], [place] [address]    Culture   Final    NO GROWTH Performed at [facility], [place] [address], [place], [place] [address]    Report Status 04/16/2017 FINAL  Final    Coagulation Studies: Recent Labs    04/14/17 1335  LABPROT 12.1  INR 0.90    Imaging: Ct Head Wo Contrast  Result Date: 04/14/2017 CLINICAL DATA:  Headache. Metastatic lung cancer. Rule out subarachnoid hemorrhage EXAM: CT HEAD WITHOUT CONTRAST TECHNIQUE: Contiguous axial images were obtained from the base of the skull through the vertex without intravenous contrast. COMPARISON:  MRI head 03/26/2017 FINDINGS: Brain: Mass lesion left lateral cerebellum measures 36 x 42 mm similar in size to the prior study. This has mixed cystic and solid component. Increased mass-effect on the fourth ventricle which is displaced to the right. No obstructive hydrocephalus.  Left parietal convexity hemorrhagic metastatic deposit measures 28 x 22 mm and is increased in size. Surrounding edema has improved. No new mass lesions identified. Generalized atrophy. Chronic infarcts in the left cerebral white matter and left basal ganglia. Negative for acute infarct. Vascular: Negative for hyperdense vessel Skull: Negative Sinuses/Orbits: Air-fluid level right maxillary sinus Other: None IMPRESSION: Metastatic lesions in the left cerebellum and left parietal convexity with interval enlargement since recent studies compatible with progression of tumor. Progressive mass-effect on the fourth ventricle without obstructive hydrocephalus. Improvement in vasogenic edema in the left parietal lobe likely related to interval steroid treatment. Chronic infarct left basal ganglia and deep white matter. No acute infarct. Electronically Signed   By: Franchot Gallo M.D.   On: 04/14/2017 14:28    Medications:  I have reviewed the patient's current medications. Scheduled: . amLODipine  10 mg Oral Daily  . aspirin EC  81 mg Oral Daily  . atorvastatin  80 mg Oral q1800  . dexamethasone  10 mg Intravenous Q12H  . enoxaparin (LOVENOX) injection  40 mg Subcutaneous Q24H  . hydrochlorothiazide  12.5 mg Oral Daily  . pantoprazole  40 mg Oral Daily    Assessment/Plan: Patient somewhat more alert.  Per daughter less agitated.  On Depakote.  VPA level of 40.  No further seizure activity noted.  Recommendations: 1.  Increase Depakote to 750mg  BID 2.  Depakote level in AM 3.  Decrease Keppra dose to 500mg  BID with plans to discontinue on 1/1.   4.  Continue seizure precautions   LOS: 1 day   Alexis Goodell, MD Neurology [phone] 04/16/2017  11:37 AM

## 2017-04-16 NOTE — Consult Note (Signed)
Jonathan Bowen at [place] NAME: Jonathan Bowen    MR#:  [id number]  DATE OF BIRTH:  [birth year]  DATE OF SERVICE:  04/16/2017  CONSULTING PHYSICIAN (Radiation Oncologist): Servando Salina., MD, MPH  PRIMARY CARE PHYSICIAN: [name], MD   REQUESTING/REFERRING PHYSICIAN: Lequita Asal, MD     CHIEF COMPLAINT:      Chief Complaint  Patient presents with  . Loss of Consciousness, headches, seizure    HISTORY OF PRESENT ILLNESS:  Jonathan Bowen  is a 71 y.o. male who presents with headache and elevated blood pressure. 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   Patient was seen here in the ED on 04/14/17 with headache, slurred speech and dizziness and was discharged home with Tylenol.  Family states this did not help him at home and his headache got worse so they brought him back to the ED.  Here he was given oxycodone for his headache with resolution. On 03/26/17 he underwent an MRI of the brain that revealed a mass lesion in the left lateral cerebellum that measured 36 x 42 mm.  There was mass effect on the fourth ventricle which was displaced to the right.  There was also a left parietal convexity hemorrhagic metastatic deposit that measured 28 x 22 mm with surrounded vasogenic edema.  He experienced a tonic-clonic seizure and was started on Keppra.  A PET scan on 04/03/17 revealed a hypermetabolic mass in the left parietal lobe with a maximum SUV of 10.4.  A second mass in the left lateral cerebellum had a maximum SUV of 8.8.  Another mass in the right frontal lobe was not hypermetabolic but was likely below the PET-CT size threshold.  In the thorax was a left lower lo e pulmonary nodule that measured 2.4 x 2.1 cm with a maximum SUV of 7.5.  He has been consulted by medical oncology and was referred to me today for evaluation and discussion of  treatment options including whole brain radiation therapy.    PAST MEDICAL HISTORY:       Past Medical History:  Diagnosis Date  . Agitation   . Complication of anesthesia    has never had surgery  . Dementia 2018  . Hyperlipidemia   . Hypertension   . Small cell lung cancer ([place])    with brain mets 03/2017   . Smoker   . Stroke [facility])    just happened in early February  . Urinary incontinence     PAST SURGICAL HISTORY:        Past Surgical History:  Procedure Laterality Date  . ENDARTERECTOMY Right 06/18/2015   Procedure: ENDARTERECTOMY RIGHT CAROTID;  Surgeon: Rosetta Posner, MD;  Location: [place];  Service: Vascular;  Laterality: Right;  . PATCH ANGIOPLASTY Right 06/18/2015   Procedure: PATCH ANGIOPLASTY RIGHT CAROTID ARTERY;  Surgeon: Rosetta Posner, MD;  Location: [place];  Service: Vascular;  Laterality: Right;  . PORTA CATH INSERTION N/A 04/13/2017   Procedure: PORTA CATH INSERTION;  Surgeon: Katha Cabal, MD;  Location: [place] [facility];  Service: Cardiovascular;  Laterality: N/A;    SOCIAL HISTORY:   Social History        Tobacco Use  . Smoking status: Former Smoker    Packs/day: 3.00    Years: 50.00    Pack years: 150.00    Last attempt  to quit: 05/28/2015    Years since quitting: 1.8  . Smokeless tobacco: Never Used  Substance Use Topics  . Alcohol use: No    FAMILY HISTORY:        Family History  Problem Relation Age of Onset  . Diabetes Mother   . Cancer Father        Unknown of the type of cancer   . Diabetes Sister   . ALS Brother     DRUG ALLERGIES:  No Known Allergies  MEDICATIONS AT HOME:          Prior to Admission medications   Medication Sig Start Date End Date Taking? Authorizing Provider  acetaminophen (TYLENOL) 650 MG CR tablet Take 650 mg by mouth every 8 (eight) hours as needed for pain.   Yes [provider]  albuterol (PROVENTIL HFA;VENTOLIN HFA) 108 (90 Base) MCG/ACT  inhaler Inhale 1-2 puffs into the lungs every 6 (six) hours as needed for wheezing or shortness of breath. 04/06/17  Yes McLean-Scocuzza, Nino Glow, MD  amLODipine (NORVASC) 10 MG tablet Take 1 tablet (10 mg total) by mouth daily. 04/07/16  Yes Cook, Jayce G, DO  atorvastatin (LIPITOR) 80 MG tablet Take 1 tablet (80 mg total) by mouth daily at 6 PM. 04/07/16  Yes Cook, Jayce G, DO  dexamethasone (DECADRON) 6 MG tablet Take 6 mg by mouth 2 (two) times daily.   Yes [provider]  folic acid (FOLVITE) 1 MG tablet Take 1 mg by mouth daily.   Yes [provider]  LORazepam (ATIVAN) 0.5 MG tablet Qd to bid prn 04/06/17  Yes McLean-Scocuzza, Nino Glow, MD  oxybutynin (DITROPAN-XL) 5 MG 24 hr tablet Take 1 tablet (5 mg total) by mouth at bedtime. 04/06/17  Yes McLean-Scocuzza, Nino Glow, MD  pantoprazole (PROTONIX) 40 MG tablet Take 40 mg by mouth daily.   Yes [provider]  traZODone (DESYREL) 50 MG tablet Take 50 mg by mouth at bedtime as needed for sleep.    Yes [provider]  aspirin EC 81 MG tablet Take 81 mg by mouth daily.    [provider]  lidocaine-prilocaine (EMLA) cream Apply to affected area once 04/08/17   Lloyd Huger, MD  ondansetron (ZOFRAN) 8 MG tablet Take 1 tablet (8 mg total) by mouth 2 (two) times daily as needed for refractory nausea / vomiting. Patient not taking: Reported on 04/13/2017 04/08/17   Lloyd Huger, MD  prochlorperazine (COMPAZINE) 10 MG tablet Take 1 tablet (10 mg total) by mouth every 6 (six) hours as needed (Nausea or vomiting). Patient not taking: Reported on 04/13/2017 04/08/17   Lloyd Huger, MD    REVIEW OF SYSTEMS:  Review of Systems  Constitutional: Negative for chills, fever, malaise/fatigue and weight loss.  HENT: Negative for ear pain, and tinnitus.  Positive for hearing loss. Eyes: Negative for blurred vision, double vision, pain and redness.  Respiratory: Negative  for cough, and hemoptysis. Positive for shortness of breath and sputum production. Cardiovascular: Negative for chest pain, palpitations, orthopnea and leg swelling.  Gastrointestinal: Negative for abdominal pain, nausea and vomiting. Positive for intermittent constipation and diarrhea.   Genitourinary: Negative for dysuria, frequency and hematuria.  Musculoskeletal: Negative for back pain, joint pain and neck pain.  Skin: No acne, rash, or lesions  Neurological: Positive for seizures. Negative for dizziness, tremors, focal weakness and weakness.  Endo/Heme/Allergies: Negative for polydipsia. Does not bruise/bleed easily.  Psychiatric/Behavioral: Negative for depression. The patient is nervous/anxious. The patient  does not have insomnia.       PHYSICAL EXAMINATION:  Physical Exam  Mr. Reinard was accompanied today by his loving and very supportive son and daughter.   Vitals reviewed. Constitutional: He is oriented to person, place, and time. He appears well-developed and well-nourished. No distress.  HENT:  Head: Normocephalic and atraumatic.  Mouth/Throat: Oropharynx is clear and moist.  Eyes: Conjunctivae and EOM are normal. Pupils are equal, round, and reactive to light. No scleral icterus.  Neck: Normal range of motion. Neck supple. No JVD present. No thyromegaly present.  Cardiovascular: Normal rate, regular rhythm and intact distal pulses. Exam reveals no gallop and no friction rub.  No murmur heard. Respiratory: Effort normal and breath sounds normal. No respiratory distress. He has no wheezes. He has no rales.  GI: Soft. Bowel sounds are normal. He exhibits no distension. There is no tenderness.  Musculoskeletal: Normal range of motion. He exhibits no edema.  No arthritis, no gout  Lymphadenopathy:    He has no cervical adenopathy.  Neurological: He is alert and oriented to person, place, and time. No cranial nerve deficit.  Patient was somewhat anxious.   Skin: Skin is warm  and dry. No rash noted. No erythema.  Psychiatric: He has a normal mood and affect. His behavior is normal. Judgment and thought content normal. Positive for agitation.    RADIOLOGY:   CT Head Wo Contrast (Accession [id number]) (Order [id number])  Imaging  Date: 04/14/2017 Department: [facility] EMERGENCY DEPARTMENT Released By/Authorizing: Schuyler Amor, MD (auto-released)  Exam Information   Status Exam Begun  Exam Ended   Final [99] 04/14/2017 1:54 PM 04/14/2017 1:58 PM  PACS Images   Show images for CT Head Wo Contrast  Study Result   CLINICAL DATA:  Headache. Metastatic lung cancer. Rule out subarachnoid hemorrhage  EXAM: CT HEAD WITHOUT CONTRAST  TECHNIQUE: Contiguous axial images were obtained from the base of the skull through the vertex without intravenous contrast.  COMPARISON:  MRI head 03/26/2017  FINDINGS: Brain: Mass lesion left lateral cerebellum measures 36 x 42 mm similar in size to the prior study. This has mixed cystic and solid component. Increased mass-effect on the fourth ventricle which is displaced to the right. No obstructive hydrocephalus.  Left parietal convexity hemorrhagic metastatic deposit measures 28 x 22 mm and is increased in size. Surrounding edema has improved.  No new mass lesions identified. Generalized atrophy. Chronic infarcts in the left cerebral white matter and left basal ganglia. Negative for acute infarct.  Vascular: Negative for hyperdense vessel  Skull: Negative  Sinuses/Orbits: Air-fluid level right maxillary sinus  Other: None  IMPRESSION: Metastatic lesions in the left cerebellum and left parietal convexity with interval enlargement since recent studies compatible with progression of tumor. Progressive mass-effect on the fourth ventricle without obstructive hydrocephalus. Improvement in vasogenic edema in the left parietal lobe likely related to interval steroid  treatment.  Chronic infarct left basal ganglia and deep white matter. No acute infarct.   Electronically Signed   By: Franchot Gallo M.D.   On: 04/14/2017 14:28    NM PET Image Initial (PI) Skull Base To Thigh (Accession [id number]) (Order [id number])  Imaging  Date: 04/03/2017 Department: [facility] CT Released By: Urban Gibson Authorizing: Lloyd Huger, MD   Study Result   CLINICAL DATA:  Initial treatment strategy for small cell carcinoma of the lung, metastatic to the brain  EXAM: NUCLEAR MEDICINE PET SKULL BASE TO THIGH  TECHNIQUE:  13.3 mCi F-18 FDG was injected intravenously. Full-ring PET imaging was performed from the skull base to thigh after the radiotracer. CT data was obtained and used for attenuation correction and anatomic localization.  FASTING BLOOD GLUCOSE:  Value: 113 mg/dl  COMPARISON:  Multiple exams, including 03/22/2017  FINDINGS: NECK AND HEAD  Primarily hypermetabolic mass of the left parietal lobe with surrounding vasogenic edema, maximum SUV 10.4.  Cystic and solid mass of the left lateral cerebellum, maximum SUV in the solid elements 8.8.  Asymmetric hypoactivity in the left lentiform nuclei due to prior infarcts.  The smaller potential lesion in the right frontal lobe is not appreciably hypermetabolic but is likely below sensitive PET-CT size thresholds. 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  No hypermetabolic adenopathy in the neck or other abnormal metabolic activity in the neck.  Mild chronic left maxillary sinusitis.  CHEST  The dominant left lower lobe pulmonary nodule measures 2.4 by 2.1 cm on image 136/3, and has a maximum standard uptake value of 7.5. Bilateral airway thickening especially in the lower lobes.  No hypermetabolic or pathologically enlarged adenopathy observed in the chest.  Atherosclerotic calcification of the aortic arch and branch  vessels.  ABDOMEN/PELVIS  No abnormal hypermetabolic activity within the liver, pancreas, adrenal glands, or spleen. No hypermetabolic lymph nodes in the abdomen or pelvis.  Focal accentuated activity at the level of the anal sphincter without appreciable CT abnormality, maximum SUV 7.1, most likely to be physiologic.  Incidental fat density in the descending duodenum compatible with a small lipoma.  Aortoiliac atherosclerotic vascular disease. Sigmoid colon diverticulosis. Curvilinear calcification in the prostate gland.  No significant abnormal hypermetabolic activity in the vicinity of the 8 by 7 mm pancreatic head hypodense lesion.  The 0.8 by 0.6 cm exophytic lesion from the right kidney lower pole is mildly hyperdense precontrast and is probably a complex cyst, but technically too small to characterize.  SKELETON  Mildly accentuated focal activity in the thoracic spine at the T5-6 level eccentric to the right, maximum SUV 3.8, without appreciable CT correlate. Activity at other vertebral levels is in the 3.4 range, hence this may simply be incidental or due to degenerative findings.  IMPRESSION: 1. Hypermetabolic left lower lobe pulmonary nodule, maximum SUV 7.5, compatible with malignancy. 2. Hypermetabolic masses in the left parietal lobe and left cerebellum favoring metastatic lesions. A questionably enhancing lesion in the right frontal lobe is not appreciably hypermetabolic on PET-CT, but is probably below sensitive PET-CT size thresholds. 3. No findings of hypermetabolic adenopathy in the chest, or metastatic disease to the abdomen/pelvis. 4. Mildly accentuated focal activity eccentric to the right at the T5-6 level, barely above background activity level, probably due to spondylosis or incidental given that there is no correlate on the CT data. This area may warrant surveillance on follow up imaging. 5. Small hypodense lesion in the pancreatic head  is not appreciably hypermetabolic, but may warrant surveillance. 6. Small complex lesion of the right kidney demonstrates hyperdensity on the noncontrast CT data, and is statistically most likely to be a small benign complex cyst, but also warrant surveillance. 7. Other imaging findings of potential clinical significance: Mild chronic left maxillary sinusitis. Aortic Atherosclerosis (ICD10-I70.0). Sigmoid colon diverticulosis. Old left lentiform nucleus infarcts.   Electronically Signed   By: Van Clines M.D.   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    PLAN: Today I had an opportunity to spend 50  minutes with Mr. Deardorff, his son and daughter during which time I reviewed with them the results of the recent radiographic work-up including the CT scans of the brain, the PET scan as well as the MRI of the brain and CT scans of chest, abdomen and pelvis.  We discussed the natural history of small cell carcinoma and the standard of care treatment regimen for metastatic small cell carcinoma with brain metastases.  We discussed the various difference between palliative whole brain radiation therapy and systemic treatment with chemotherapy.  I reviewed with Mr. Sevey and his family members the rationale for the use of palliative radiation therapy in this setting, the logistics of radiation therapy as well as the potential risks and complications, both in the acute and late setting.  I proposed simulating Mr. Goins today and initiating radiation therapy on 04/18/17.  However, Mr. Mandigo and his family members are undecided as to whether he wishes to proceed with treatment or consider hospice.  As a result, I have canceled the simulation and will allow Mr. Kiss to more fully consider his options.  In there interim he should remain on Decadron as his headaches persist.  I also discussed with Mr. Cowher that we may recommend radiation  therapy to the thorax after the whole brain radiation therapy is complete.  Mr. Bevis is currently scheduled to consult with Dr. Mike Gip in medical oncology and consider initiating systemic chemotherapy--most likely chemotherapy regimen would be carboplatinum and etoposide..    Throughout my consultation with Mr. Crass and his family they were given an opportunity to ask questions that appeared to have been answered to their satisfaction.  As noted, they wish to consider their options more fully (including hospice) before making a formal decision.  They are also extremely concerned about the financial aspects of treatment so I suggested that they consult with a Education officer, museum.  I also offered to have them meet with our billing staff.  We will follow up with them later this week.    As always, thank you very much for allowing me to participate in the care of this extremely pleasant, yet most unfortunate gentleman.    Meighan Treto A. Claudie Leach, MD, MPH Radiation Oncology

## 2017-04-16 NOTE — Consult Note (Signed)
Consultation Note Date: 04/16/2017   Patient Name: Jonathan Bowen  DOB: [birth year]  MRN: [id number]  Age / Sex: 71 y.o., male  PCP: Jonathan Haven, MD Referring Physician: Loletha Grayer, MD  Reason for Consultation: Establishing goals of care  HPI/Patient Profile: Jonathan Bowen is a 71 y.o. male with recently diagnosed metastatic lung cancer to the brain who presented to the ED for headache. Patient was seen  earlier same day in the ED for similar complaints.     Clinical Assessment and Goals of Care: Jonathan Bowen is resting in bedside chair. Daughter, POA at bedside. Other son and brothers present. He lives with his son. His son is concerned about not having help at home as his disease process progresses. He states his father over the past few weeks has been sleeping more and more, only getting out of bed to eat, then going back to bed.   We discussed his diagnosis, prognosis, GOC, EOL wishes disposition and options.  Jonathan Bowen states he is not sure if he would want palliative chemotherapy or radiation. He and his family has questions about this vs medication management for seizure and headaches. They are concerned about what the palliative chemo/radiation would look like as far as frequency and side effects. They are also concerned about sedation and lethargy with using only medications to manage his symptoms. His daughter states she understands at this point, the goal is to make him comfortable for the time he has left, but "don't want him just to go home and lay there and die". Discussed hospice vs palliative care with treatment path.   At this time, his daughter would like to continue medical care as they discuss their options. They would like to speak with Jonathan Bowen for questions.      SUMMARY OF RECOMMENDATIONS   Family waiting to speak with Jonathan Bowen before making decisions on palliative  radiation/chemo, and hospice.   Leaning toward Hospice and no palliative radiation or chemo, but concerned about additional seizures.   Code Status/Advance Care Planning:  DNR/DNI.     Symptom Management:   No complaints at this time.   Palliative Prophylaxis:   Oral Care  Prognosis:   < 6 months Lung CA with brain mets. Seizure in ED was 1st seizure, headaches.   Discharge Planning: To Be Determined      Primary Diagnoses: Present on Admission: . Accelerated hypertension . Headache . Small cell lung cancer ([place]) . Hyperlipidemia   I have reviewed the medical record, interviewed the patient and family, and examined the patient. The following aspects are pertinent.  Past Medical History:  Diagnosis Date  . Agitation   . Complication of anesthesia    has never had surgery  . Dementia 2018  . Hyperlipidemia   . Hypertension   . Small cell lung cancer ([place])    with brain mets 03/2017   . Smoker   . Stroke [facility])    just happened in early February  . Urinary incontinence    Social History  Socioeconomic History  . Marital status: Divorced    Spouse name: None  . Number of children: 2  . Years of education: None  . Highest education level: None  Social Needs  . Financial resource strain: None  . Food insecurity - worry: Patient refused  . Food insecurity - inability: Patient refused  . Transportation needs - medical: Patient refused  . Transportation needs - non-medical: Patient refused  Occupational History  . None  Tobacco Use  . Smoking status: Former Smoker    Packs/day: 3.00    Years: 50.00    Pack years: 150.00    Last attempt to quit: 05/28/2015    Years since quitting: 1.8  . Smokeless tobacco: Never Used  Substance and Sexual Activity  . Alcohol use: No  . Drug use: No  . Sexual activity: Not Currently  Other Topics Concern  . None  Social History Narrative   Lives with son in [place]. Single, divorced. No pets in home.       Work - Retired Dealer.      Diet - regular      Exercise - limited.   Family History  Problem Relation Age of Onset  . Diabetes Mother   . Cancer Father        Unknown of the type of cancer   . Diabetes Sister   . ALS Brother    Scheduled Meds: . amLODipine  10 mg Oral Daily  . aspirin EC  81 mg Oral Daily  . atorvastatin  80 mg Oral q1800  . dexamethasone  10 mg Intravenous Q12H  . enoxaparin (LOVENOX) injection  40 mg Subcutaneous Q24H  . hydrochlorothiazide  12.5 mg Oral Daily  . pantoprazole  40 mg Oral Daily   Continuous Infusions: . sodium chloride 50 mL/hr at 04/15/17 1634  . Jonathan Bowen    . valproate sodium     PRN Meds:.acetaminophen **OR** acetaminophen, albuterol, haloperidol lactate, hydrALAZINE, LORazepam, LORazepam, LORazepam, ondansetron **OR** ondansetron (ZOFRAN) IV, oxyCODONE, traZODone Medications Prior to Admission:  Prior to Admission medications   Medication Sig Start Date End Date Taking? Authorizing Provider  acetaminophen (TYLENOL) 650 MG CR tablet Take 650 mg by mouth every 8 (eight) hours as needed for pain.   Yes [provider]  albuterol (PROVENTIL HFA;VENTOLIN HFA) 108 (90 Base) MCG/ACT inhaler Inhale 1-2 puffs into the lungs every 6 (six) hours as needed for wheezing or shortness of breath. 04/06/17  Yes Jonathan Bowen, Jonathan Glow, MD  amLODipine (NORVASC) 10 MG tablet Take 1 tablet (10 mg total) by mouth daily. 04/07/16  Yes Jonathan Bowen, Jonathan G, DO  atorvastatin (LIPITOR) 80 MG tablet Take 1 tablet (80 mg total) by mouth daily at 6 PM. 04/07/16  Yes Jonathan Bowen, Jonathan G, DO  dexamethasone (DECADRON) 6 MG tablet Take 6 mg by mouth 2 (two) times daily.   Yes [provider]  folic acid (FOLVITE) 1 MG tablet Take 1 mg by mouth daily.   Yes [provider]  LORazepam (ATIVAN) 0.5 MG tablet Qd to bid prn 04/06/17  Yes Jonathan Bowen, Jonathan Glow, MD  oxybutynin (DITROPAN-XL) 5 MG 24 hr tablet Take 1 tablet (5 mg total) by mouth at  bedtime. 04/06/17  Yes Jonathan Bowen, Jonathan Glow, MD  pantoprazole (PROTONIX) 40 MG tablet Take 40 mg by mouth daily.   Yes [provider]  traZODone (DESYREL) 50 MG tablet Take 50 mg by mouth at bedtime as needed for sleep.    Yes [provider]  aspirin EC 81  MG tablet Take 81 mg by mouth daily.    [provider]  lidocaine-prilocaine (EMLA) cream Apply to affected area once 04/08/17   Lloyd Huger, MD  ondansetron (ZOFRAN) 8 MG tablet Take 1 tablet (8 mg total) by mouth 2 (two) times daily as needed for refractory nausea / vomiting. Patient not taking: Reported on 04/13/2017 04/08/17   Lloyd Huger, MD  prochlorperazine (COMPAZINE) 10 MG tablet Take 1 tablet (10 mg total) by mouth every 6 (six) hours as needed (Nausea or vomiting). Patient not taking: Reported on 04/13/2017 04/08/17   Lloyd Huger, MD   No Known Allergies Review of Systems  Neurological: Positive for headaches.    Physical Exam  Constitutional: No distress.  Pulmonary/Chest: Effort normal.  Neurological: He is alert.  Skin: Skin is warm and dry.    Vital Signs: BP 133/60 (BP Location: Left Arm)   Pulse 72   Temp 98.4 F (36.9 C) (Oral)   Resp 18   Ht 5\' 7"  (1.702 m)   Wt 104.3 kg (230 lb)   SpO2 93%   BMI 36.02 kg/m  Pain Assessment: Faces   Pain Score: 0-No pain   SpO2: SpO2: 93 % O2 Device:SpO2: 93 % O2 Flow Rate: .O2 Flow Rate (L/min): 4 L/min  IO: Intake/output summary:   Intake/Output Summary (Last 24 hours) at 04/16/2017 1158 Last data filed at 04/16/2017 0200 Gross per 24 hour  Intake 686.67 ml  Output -  Net 686.67 ml    LBM: Last BM Date: (UTA Pt does not remember) Baseline Weight: Weight: 104.3 kg (230 lb) Most recent weight: Weight: 104.3 kg (230 lb)     Palliative Assessment/Data: 30%     Time In: 10:20 Time Out: 11:30 Time Total: 70 min Greater than 50%  of this time was spent counseling and coordinating care related to the  above assessment and plan.  Signed by: Asencion Gowda, NP   Please contact Palliative Medicine Team phone at [phone] for questions and concerns.  For individual provider: See Shea Evans

## 2017-04-16 NOTE — Progress Notes (Signed)
Patient ID: Jonathan Bowen, male   DOB: [birth year], 71 y.o.   MRN: [id number]  ACP note  Daughter and patient at the bedside.  The patient has metastatic lung cancer to the brain.  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  The patient states that he does not want treatment for his lung cancer.  He will speak with Dr. Donella Stade radiation oncologist about brain radiation.  He will continue seizure medication.  Family interested in palliative care consultation and potential hospice.  Patient already a DNR.  Time spent on ACP discussion 17 minutes  Dr. Loletha Grayer

## 2017-04-16 NOTE — Evaluation (Signed)
Occupational Therapy Evaluation Patient Details Name: Jonathan Bowen MRN: [id number] DOB: May 14, 1945 Today's Date: 04/16/2017    History of Present Illness 71 yo male pt presented to ER secondary to HA, nausea/vomiting; admitted with accelerated HTN, noted with generalized tonic-clonic seizure in ED (loaded with Keppra).  PMH significant for L-sided CVA (with R hemiparesis, mild aphasia), lung CA with mets to brain, dementia.   Clinical Impression   Pt seen for OT evaluation this date. Pt with complex medical history and residual deficits from previous CVA. Pt lives at home with son who provides assist for ADL and IADL including medication mgt. Pt enjoys yard work and Barista, which dtr indicates he was doing up until recently. Pt has had 6-10 falls in past with progressive decline in function and strength in recent weeks. Pt presents with deficits in R side strength, suspected sensory impairment, baseline LUE inattention, impaired cognition, mildly agitated with decreased insight into deficits and safety awareness, and increased need for assist with ADL and functional mobility. Pt/daughter educated in home/routines modifications, AE/DME, energy conservation strategies and falls prevention. Handout provided. Daughter verbalizes understanding, unclear whether pt retained any information. Pt/family waiting to make final decision on overall goals of care. Pt will benefit from skilled OT services to address noted impairments and functional deficits to maximize functional independence and quality of life while minimizing risk of falls/injury/decline/rehospitalization/increased caregiver burden or higher level of care. Recommend STR following hospitalization, pending pt/family's goals for care.      Follow Up Recommendations  SNF    Equipment Recommendations  3 in 1 bedside commode;Hospital bed;Toilet rise with handles;Tub/shower bench    Recommendations for Other Services       Precautions /  Restrictions Precautions Precautions: Fall Restrictions Weight Bearing Restrictions: No      Mobility Bed Mobility Overal bed mobility: Needs Assistance Bed Mobility: Supine to Sit;Sit to Supine     Supine to sit: Mod assist Sit to supine: Mod assist      Transfers                      Balance Overall balance assessment: Needs assistance Sitting-balance support: No upper extremity supported;Feet supported Sitting balance-Leahy Scale: Fair                                     ADL either performed or assessed with clinical judgement   ADL Overall ADL's : Needs assistance/impaired Eating/Feeding: Bed level;Minimal assistance   Grooming: Bed level;Set up;Supervision/safety   Upper Body Bathing: Bed level;Moderate assistance;Minimal assistance   Lower Body Bathing: Bed level;Maximal assistance;Moderate assistance   Upper Body Dressing : Minimal assistance;Moderate assistance;Bed level   Lower Body Dressing: Bed level;Moderate assistance;Maximal assistance   Toilet Transfer: RW;Moderate assistance;Cueing for safety;Stand-pivot;BSC;+2 for physical assistance                   Vision Baseline Vision/History: Wears glasses Wears Glasses: At all times Patient Visual Report: No change from baseline Additional Comments: difficult to assess this date due to pt's agitation     Perception     Praxis      Pertinent Vitals/Pain Pain Assessment: No/denies pain     Hand Dominance Left   Extremity/Trunk Assessment Upper Extremity Assessment Upper Extremity Assessment: (RUE 4-/5 w/ baseline inattention and suspected sensory deficit; LUE 4+/5)   Lower Extremity Assessment Lower Extremity Assessment: Defer to PT evaluation(RLE 3-/5 with suspected  sensory deficit; LLE 4+/5)       Communication Communication Communication: Other (comment)(mild expressive aphasia from previous stroke)   Cognition Arousal/Alertness: Awake/alert Behavior  During Therapy: Agitated                                   General Comments: oriented to self only, generally aware of global situation; follows simple commands, but requires increased cuing for attention to task and overall alertness when in supine   General Comments       Exercises Other Exercises Other Exercises: pt/daughter educated in energy conservation strategies, AE/DME for ADL tasks, and home/routines modifications to maximize safety and functional independence with emphasis on quality of life and minimizing energy exertion for basic ADL tasks. Handout provided. No indication of learning by pt. Dtr verbalized understanding.    Shoulder Instructions      Home Living Family/patient expects to be discharged to:: Private residence Living Arrangements: Children(son) Available Help at Discharge: Family;Available 24 hours/day Type of Home: House Home Access: Stairs to enter [employer] of Steps: 3 Entrance Stairs-Rails: None Home Layout: One level     Bathroom Shower/Tub: Corporate investment banker: Standard     Home Equipment: None          Prior Functioning/Environment Level of Independence: Needs assistance        Comments: Mod indep/supervision assist for limited household mobility (should use RW, but does not per family); son assists as needed with ADLs, household chores, and driving.  Endorses 6-10 falls in previous 6 months with progressive decline in functional strength/ability in recent weeks        OT Problem List: Decreased strength;Decreased knowledge of use of DME or AE;Decreased cognition;Impaired UE functional use;Decreased safety awareness;Impaired balance (sitting and/or standing)      OT Treatment/Interventions: Self-care/ADL training;Therapeutic exercise;Therapeutic activities;Neuromuscular education;Cognitive remediation/compensation;Energy conservation;DME and/or AE instruction;Patient/family education     OT Goals(Current goals can be found in the care plan section) Acute Rehab OT Goals Patient Stated Goal: to return home OT Goal Formulation: With patient/family Time For Goal Achievement: 04/30/17 Potential to Achieve Goals: Good ADL Goals Pt Will Transfer to Toilet: with min assist;with +2 assist;stand pivot transfer;bedside commode(with LRAD for ambulation) Additional ADL Goal #1: Pt/family will verbalize plan for implementing at least 1 learned falls prevention/energy conservation strategy in the home to maximize safety and quality of life.  OT Frequency: Min 2X/week   Barriers to D/C:            Co-evaluation              AM-PAC PT "6 Clicks" Daily Activity     Outcome Measure Help from another person eating meals?: A Little Help from another person taking care of personal grooming?: A Little Help from another person toileting, which includes using toliet, bedpan, or urinal?: A Lot Help from another person bathing (including washing, rinsing, drying)?: A Lot Help from another person to put on and taking off regular upper body clothing?: A Lot Help from another person to put on and taking off regular lower body clothing?: A Lot 6 Click Score: 14   End of Session    Activity Tolerance: Treatment limited secondary to agitation Patient left: in bed;with call bell/phone within reach;with bed alarm set;with nursing/sitter in room;with family/visitor present  OT Visit Diagnosis: Other abnormalities of gait and mobility (R26.89);History of falling (Z91.81);Other symptoms and signs involving cognitive function;Hemiplegia and  hemiparesis Hemiplegia - Right/Left: Right Hemiplegia - dominant/non-dominant: Non-Dominant Hemiplegia - caused by: Cerebral infarction                Time: 1856-3149 OT Time Calculation (min): 26 min Charges:  OT General Charges $OT Visit: 1 Visit OT Evaluation $OT Eval Moderate Complexity: 1 Mod OT Treatments $Self Care/Home Management : 8-22  mins G-Codes: OT G-codes **NOT FOR INPATIENT CLASS** Functional Assessment Tool Used: AM-PAC 6 Clicks Daily Activity;Clinical judgement Functional Limitation: Self care Self Care Current Status (F0263): At least 60 percent but less than 80 percent impaired, limited or restricted Self Care Goal Status (Z8588): At least 40 percent but less than 60 percent impaired, limited or restricted   Jeni Salles, MPH, MS, OTR/L ascom [phone] 04/16/17, 4:50 PM

## 2017-04-16 NOTE — Telephone Encounter (Signed)
Notified Kim in Radiation Oncology regarding inpatient referral. Oncology Nurse Navigator Documentation  Navigator Location: [facility] (04/16/17 0900)   )Navigator Encounter Type: Telephone (04/16/17 0900)                                                    Time Spent with Patient: 15 (04/16/17 0900)

## 2017-04-16 NOTE — Evaluation (Signed)
Physical Therapy Evaluation Patient Details Name: Jonathan Bowen MRN: [id number] DOB: [birth year] Today's Date: 04/16/2017   History of Present Illness  presented to ER secondary to HA, nausea/vomiting; admitted with accelerated HTN, noted with generalized tonic-clonic seizure in ED (loaded with Keppra).  PMH significant for L-sided CVA (with R hemiparesis, mild aphasia), lung CA with mets to brain, dementia.  Clinical Impression  Upon evaluation, patient sleeping, but easily arousable to voice/light touch from therapist.  Generally confused, but follows simple commands with increased time for processing and initiation.  Baseline R UE > LE hemiparesis appreciated (? worsened from baseline?) with mild/mod inattention, suspected sensory deficit; mild expressive aphasia.  Demonstrates mild R lateral lean/pushing behaviors to R with fatigue in unsupported sitting/standing, requiring min/mod manual facilitation from therapist all times to promote neutral trunk alignment/activaiton and midline orientation in all planes.  Currently requiring mod assist for bed mobility; mod assist +2 for sit/stand, basic transfers and very short-distance gait (5 steps) with RW.  Broad BOS with poor balance reactions, poor control/management of R LE (tends to drag across floor with dep weight shift from therapist). Unsafe/unable to tolerate additional distance at this time; unsafe/unable to attempt stairs required for safe entry/exit of home environment. Would benefit from skilled PT to address above deficits and promote optimal return to PLOF; recommend transition to STR upon discharge from acute hospitalization.     Follow Up Recommendations SNF    Equipment Recommendations  Rolling walker with 5" wheels;3in1 (PT);Hospital bed    Recommendations for Other Services       Precautions / Restrictions Precautions Precautions: Fall Restrictions Weight Bearing Restrictions: No      Mobility  Bed Mobility Overal bed  mobility: Needs Assistance Bed Mobility: Supine to Sit     Supine to sit: Mod assist     General bed mobility comments: for LE management, truncal elevation  Transfers Overall transfer level: Needs assistance Equipment used: Rolling walker (2 wheeled) Transfers: Sit to/from Stand Sit to Stand: Mod assist;+2 physical assistance         General transfer comment: broad BOS, poor balance/stability (very limited balance reactions); constant cuing for postural extension and midline orietnation in all planes  Ambulation/Gait Ambulation/Gait assistance: Mod assist;+2 physical assistance Ambulation Distance (Feet): 5 Feet Assistive device: Rolling walker (2 wheeled)       General Gait Details: broad BOS, manual facilitation for weight shift to unweight/advance LEs; significant difficulty with management of R LE   Stairs            Wheelchair Mobility    Modified Rankin (Stroke Patients Only)       Balance Overall balance assessment: Needs assistance Sitting-balance support: No upper extremity supported;Feet supported Sitting balance-Leahy Scale: Fair Sitting balance - Comments: R lateral lean, mild pushing behaviors (towards R) at times, min/mod assist to maintain   Standing balance support: Bilateral upper extremity supported Standing balance-Leahy Scale: Poor                               Pertinent Vitals/Pain Pain Assessment: Faces Faces Pain Scale: Hurts little more Pain Location: headache, R UE? Pain Descriptors / Indicators: Grimacing Pain Intervention(s): Monitored during session;Limited activity within patient's tolerance;Repositioned;Patient requesting pain meds-RN notified    Home Living Family/patient expects to be discharged to:: Private residence Living Arrangements: Children Available Help at Discharge: Family;Available 24 hours/day Type of Home: House Home Access: Stairs to enter Entrance Stairs-Rails: None Entrance [employer]  of Steps: 3 Home Layout: One level Home Equipment: None      Prior Function Level of Independence: Needs assistance         Comments: Mod indep/supervision assist for limited household mobility (should use RW, but does not per family); son assists as needed with ADLs and household chores.  Endorses 6-10 falls in previous 6 months with progressive decline in functional strength/ability in recent weeks     Hand Dominance   Dominant Hand: Left    Extremity/Trunk Assessment   Upper Extremity Assessment Upper Extremity Assessment: (R UE grossly 4-/5 throughout with appropriate attention; generalized inattention to extremity with suspected sensory deficits (baseline for patient).  L UE grossly 4+/5 and WFL.)    Lower Extremity Assessment Lower Extremity Assessment: (R LE grossly 3-/5 throughout, suspected sensory deficit; L LE grossly 4+/5)    Cervical / Trunk Assessment Cervical / Trunk Assessment: (forward head, forward flexed posture; R lateral lean, mild pushing behaviors as fatigue increases)  Communication   Communication: (mild expressive aphasia from previous CVA)  Cognition Arousal/Alertness: Awake/alert Behavior During Therapy: WFL for tasks assessed/performed                                   General Comments: oriented to self only, generally aware of global situation; follows simple commands, but requires increased cuing for attention to task and overall alertness when in supine      General Comments      Exercises Other Exercises Other Exercises: Unsupported sitting balance edge of bed, worked to promote postural extension and awareness of/correction of balance to mdline in all planes.  Min/mod assist; mild pushing behaviors Other Exercises: Sit/stand with RW x2 reps, mod assist +2-constant cuing for postural extension, L ant/lateral weight shift.  Dep assist for management of undergarments after incontinent bladder episode. Other Exercises:  Positioned to midline in recliner (towel roll under R hip to promote closure of R lateral trunk, passive weight shift to L hemi-body)   Assessment/Plan    PT Assessment Patient needs continued PT services  PT Problem List Decreased strength;Decreased range of motion;Decreased balance;Decreased activity tolerance;Decreased coordination;Decreased cognition;Decreased knowledge of use of DME;Decreased mobility;Decreased safety awareness;Decreased knowledge of precautions;Cardiopulmonary status limiting activity;Pain       PT Treatment Interventions DME instruction;Gait training;Therapeutic exercise;Stair training;Functional mobility training;Neuromuscular re-education;Balance training;Therapeutic activities;Cognitive remediation;Patient/family education    PT Goals (Current goals can be found in the Care Plan section)  Acute Rehab PT Goals Patient Stated Goal: to return home PT Goal Formulation: With patient Time For Goal Achievement: 04/30/17 Potential to Achieve Goals: Fair    Frequency Min 2X/week   Barriers to discharge        Co-evaluation               AM-PAC PT "6 Clicks" Daily Activity  Outcome Measure Difficulty turning over in bed (including adjusting bedclothes, sheets and blankets)?: Unable Difficulty moving from lying on back to sitting on the side of the bed? : Unable Difficulty sitting down on and standing up from a chair with arms (e.g., wheelchair, bedside commode, etc,.)?: Unable Help needed moving to and from a bed to chair (including a wheelchair)?: A Lot Help needed walking in hospital room?: A Lot Help needed climbing 3-5 steps with a railing? : Total 6 Click Score: 8    End of Session Equipment Utilized During Treatment: Gait belt Activity Tolerance: Patient tolerated treatment well;Patient limited by fatigue  Patient left: in chair;with call bell/phone within reach;with chair alarm set;with family/visitor present Nurse Communication: Mobility  status PT Visit Diagnosis: Unsteadiness on feet (R26.81);Muscle weakness (generalized) (M62.81);Repeated falls (R29.6);Difficulty in walking, not elsewhere classified (R26.2);Hemiplegia and hemiparesis Hemiplegia - Right/Left: Right Hemiplegia - dominant/non-dominant: Non-dominant Hemiplegia - caused by: Other cerebrovascular disease    Time: 0907-0942 PT Time Calculation (min) (ACUTE ONLY): 35 min   Charges:   PT Evaluation $PT Eval Moderate Complexity: 1 Mod PT Treatments $Therapeutic Activity: 8-22 mins $Neuromuscular Re-education: 8-22 mins   PT G Codes:   PT G-Codes **NOT FOR INPATIENT CLASS** Functional Assessment Tool Used: AM-PAC 6 Clicks Basic Mobility Functional Limitation: Mobility: Walking and moving around Mobility: Walking and Moving Around Current Status (Q4920): At least 80 percent but less than 100 percent impaired, limited or restricted Mobility: Walking and Moving Around Goal Status [phone]): At least 20 percent but less than 40 percent impaired, limited or restricted    Tyiana Hill H. Owens Shark, PT, DPT, NCS 04/16/17, 10:20 AM [phone]

## 2017-04-16 NOTE — Progress Notes (Addendum)
Patient ID: Jonathan Bowen, male   DOB: November 28, 1945, 71 y.o.   MRN: [id number]  [facility] PROGRESS NOTE  Jonathan Bowen OYD:[id number] DOB: 1945/06/30 DOA: 04/14/2017 PCP: [name], MD  HPI/Subjective: Patient seen this morning.  He was sitting in the chair but slumped in the chair and very uncomfortable.  He was able to answer some questions and talk a little bit better today. But he is not interested in obtaining chemotherapy for his lung cancer but potentially interested in radiation therapy for his brain.  Objective: Vitals:   04/16/17 0500 04/16/17 0950  BP: (!) 174/62 133/60  Pulse: 68 72  Resp: 18 18  Temp: 98.4 F (36.9 C)   SpO2: 93%     Filed Weights   04/14/17 1853  Weight: 104.3 kg (230 lb)    ROS: Review of Systems  Unable to perform ROS: Mental acuity  Respiratory: Negative for shortness of breath.   Cardiovascular: Negative for chest pain.  Gastrointestinal: Negative for abdominal pain.   Exam: Physical Exam  HENT:  Nose: No mucosal edema.  Mouth/Throat: No oropharyngeal exudate or posterior oropharyngeal edema.  Eyes: Conjunctivae and lids are normal. Pupils are equal, round, and reactive to light.  Neck: No JVD present. Carotid bruit is not present. No edema present. No thyroid mass and no thyromegaly present.  Cardiovascular: S1 normal and S2 normal. Exam reveals no gallop.  No murmur heard. Pulses:      Dorsalis pedis pulses are 2+ on the right side, and 2+ on the left side.  Respiratory: No respiratory distress. He has decreased breath sounds in the right middle field, the right lower field, the left middle field and the left lower field. He has wheezes in the right middle field, the right lower field, the left middle field and the left lower field. He has no rhonchi. He has no rales.  GI: Soft. Bowel sounds are normal. There is no tenderness.  Musculoskeletal:       Right ankle: He exhibits swelling.       Left ankle: He exhibits swelling.   Lymphadenopathy:    He has no cervical adenopathy.  Neurological: He is alert.  Patient able to move all extremities today.  Still 4 out of 5 weakness all over.  Skin: Skin is warm. No rash noted. Nails show no clubbing.  Psychiatric:  Patient able to answer a few yes or no questions      Data Reviewed: Basic Metabolic Panel: Recent Labs  Lab 04/14/17 1335 04/15/17 0445 04/16/17 0602  NA 131* 132* 133*  K 4.7 4.9 4.9  CL 100* 100* 102  CO2 24 25 23   GLUCOSE 135* 139* 111*  BUN 35* 38* 43*  CREATININE 1.16 1.03 1.02  CALCIUM 8.1* 7.8* 8.0*   CBC: Recent Labs  Lab 04/14/17 1335 04/14/17 2003 04/15/17 0445  WBC 12.2* 16.1* 11.4*  NEUTROABS 11.1*  --   --   HGB 14.6 15.8 13.6  HCT 45.4 47.3 40.8  MCV 88.3 86.5 87.1  PLT 154 166 124*   . Scheduled Meds: . amLODipine  10 mg Oral Daily  . aspirin EC  81 mg Oral Daily  . atorvastatin  80 mg Oral q1800  . budesonide (PULMICORT) nebulizer solution  0.5 mg Nebulization BID  . dexamethasone  10 mg Intravenous Q12H  . enoxaparin (LOVENOX) injection  40 mg Subcutaneous Q24H  . hydrochlorothiazide  12.5 mg Oral Daily  . ipratropium-albuterol  3 mL Nebulization Q6H  . pantoprazole  40  mg Oral Daily   Continuous Infusions: . sodium chloride 50 mL/hr at 04/15/17 1634  . levETIRAcetam Stopped (04/16/17 1410)  . valproate sodium      Assessment/Plan:  1. Seizure with prolonged postictal period.  Patient has a history of brain metastases.  Patient was placed on IV Keppra  In the ER and this dose was decreased down to 500 mg IV twice daily since the patient was started on IV valproic acid.  Patient doing better today than yesterday and on admission. 2. 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  Case discussed with patient and daughter at the bedside.  He does not want chemotherapy.  He will speak with Dr. Donella Stade about  radiation therapy for his brain. 3. COPD exacerbation on steroids for brain metastases already.  And DuoNeb and budesonide nebulizers. 4. Accelerated hypertension.  Restart Norvasc orally and hydrochlorothiazide.  Blood pressure better today. 5. History of stroke on aspirin and Lipitor 6. Hyperlipidemia unspecified on atorvastatin 7. GERD on Protonix 8. Get rid of oxybutynin 9. Weakness physical therapy recommended rehab.  May be difficult placement if they are wanting radiation of the brain.  Code Status:     Code Status Orders  (From admission, onward)        Start     Ordered   04/15/17 0119  Do not attempt resuscitation (DNR)  Continuous    Question Answer Comment  In the event of cardiac or respiratory ARREST Do not call a "code blue"   In the event of cardiac or respiratory ARREST Do not perform Intubation, CPR, defibrillation or ACLS   In the event of cardiac or respiratory ARREST Use medication by any route, position, wound care, and other measures to relive pain and suffering. May use oxygen, suction and manual treatment of airway obstruction as needed for comfort.      04/15/17 0118    Code Status History    Date Active Date Inactive Code Status Order ID Comments User Context   04/15/2017 00:25 04/15/2017 01:18 Full Code [id number]  Lance Coon, MD Inpatient   05/21/2015 15:07 05/24/2015 20:23 Full Code [id number]  Francesca Oman, DO Inpatient    Advance Directive Documentation     Most Recent Value  Type of Advance Directive  Healthcare Power of Attorney, Living will  Pre-existing out of facility DNR order (yellow form or pink MOST form)  No data  "MOST" Form in Place?  No data     Family Communication: Spoke with daughter at the bedside Disposition Plan: Potential rehab  Consultants: -Neuro  Time spent: 35 minutes including ACP time  [employer]

## 2017-04-17 LAB — BASIC METABOLIC PANEL
ANION GAP: 8 (ref 5–15)
BUN: 39 mg/dL — ABNORMAL HIGH (ref 6–20)
CO2: 24 mmol/L (ref 22–32)
Calcium: 8.2 mg/dL — ABNORMAL LOW (ref 8.9–10.3)
Chloride: 100 mmol/L — ABNORMAL LOW (ref 101–111)
Creatinine, Ser: 1 mg/dL (ref 0.61–1.24)
GFR calc non Af Amer: >60 mL/min (ref >60)
Glucose, Bld: 124 mg/dL — ABNORMAL HIGH (ref 65–99)
Potassium: 5 mmol/L (ref 3.5–5.1)
Sodium: 132 mmol/L — ABNORMAL LOW (ref 135–145)

## 2017-04-17 LAB — VALPROIC ACID LEVEL: Valproic Acid Lvl: 43 ug/mL — ABNORMAL LOW (ref 50.0–100.0)

## 2017-04-17 MED ORDER — DEXAMETHASONE SODIUM PHOSPHATE 10 MG/ML IJ SOLN
4.0000 mg | Freq: Two times a day (BID) | INTRAMUSCULAR | Status: DC
  Administered 2017-04-17 – 2017-04-18 (×3): 4 mg via INTRAVENOUS
  Filled 2017-04-17 (×3): qty 1

## 2017-04-17 MED ORDER — VALPROATE SODIUM 500 MG/5ML IV SOLN
1000.0000 mg | Freq: Two times a day (BID) | INTRAVENOUS | Status: DC
  Administered 2017-04-17 – 2017-04-18 (×2): 1000 mg via INTRAVENOUS
  Filled 2017-04-17 (×4): qty 10

## 2017-04-17 MED ORDER — QUETIAPINE FUMARATE 25 MG PO TABS
25.0000 mg | ORAL_TABLET | Freq: Every day | ORAL | Status: DC
  Administered 2017-04-17: 20:00:00 25 mg via ORAL
  Filled 2017-04-17: qty 1

## 2017-04-17 NOTE — Progress Notes (Signed)
Pt has been very agitated and inappropriate today. Pt refusing vital signs and resisting most care. Pt can be combative at times. Will continue to monitor. Ammie Dalton, RN

## 2017-04-17 NOTE — Progress Notes (Signed)
Subjective: Patient agitated and wants to go home.  No further seizures noted.    Objective: Current vital signs: BP (!) 179/65   Pulse 77   Temp (!) 97.4 F (36.3 C) (Oral)   Resp 18   Ht 5\' 7"  (1.702 m)   Wt 104.3 kg (230 lb)   SpO2 95%   BMI 36.02 kg/m  Vital signs in last 24 hours: Temp:  [97.4 F (36.3 C)-97.5 F (36.4 C)] 97.4 F (36.3 C) (01/01 0606) Pulse Rate:  [77-81] 77 (01/01 0606) Resp:  [18] 18 (12/31 2004) BP: (174-179)/(65-80) 179/65 (01/01 0606) SpO2:  [95 %-96 %] 95 % (01/01 0606)  Intake/Output from previous day: 12/31 0701 - 01/01 0700 In: 867.5 [P.O.:600; IV Piggyback:267.5] Out: -  Intake/Output this shift: Total I/O In: 120 [P.O.:120] Out: -  Nutritional status: Diet regular Room service appropriate? Yes; Fluid consistency: Thin  Neurologic Exam: Resists examination.  Alert.  Agitated.  Speech with some paraphasic errors.  Moves all extremities.  Lab Results: Basic Metabolic Panel: Recent Labs  Lab 04/14/17 1335 04/15/17 0445 04/16/17 0602 04/17/17 0550  NA 131* 132* 133* 132*  K 4.7 4.9 4.9 5.0  CL 100* 100* 102 100*  CO2 24 25 23 24   GLUCOSE 135* 139* 111* 124*  BUN 35* 38* 43* 39*  CREATININE 1.16 1.03 1.02 1.00  CALCIUM 8.1* 7.8* 8.0* 8.2*    Liver Function Tests: No results for input(s): AST, ALT, ALKPHOS, BILITOT, PROT, ALBUMIN in the last 168 hours. No results for input(s): LIPASE, AMYLASE in the last 168 hours. No results for input(s): AMMONIA in the last 168 hours.  CBC: Recent Labs  Lab 04/14/17 1335 04/14/17 2003 04/15/17 0445  WBC 12.2* 16.1* 11.4*  NEUTROABS 11.1*  --   --   HGB 14.6 15.8 13.6  HCT 45.4 47.3 40.8  MCV 88.3 86.5 87.1  PLT 154 166 124*    Cardiac Enzymes: No results for input(s): CKTOTAL, CKMB, CKMBINDEX, TROPONINI in the last 168 hours.  Lipid Panel: No results for input(s): CHOL, TRIG, HDL, CHOLHDL, VLDL, LDLCALC in the last 168 hours.  CBG: No results for input(s): GLUCAP in the  last 168 hours.  Microbiology: Results for orders placed or performed during the hospital encounter of 04/14/17  Urine Culture     Status: None   Collection Time: 04/14/17  8:03 PM  Result Value Ref Range Status   Specimen Description   Final    URINE, RANDOM Performed at [facility], [address], [place], [place] [address]    Special Requests   Final    NONE Performed at [facility], [address], [place], [place] [address]    Culture   Final    NO GROWTH Performed at [facility], [place] [address], [place], [place] [address]    Report Status 04/16/2017 FINAL  Final    Coagulation Studies: Recent Labs    04/14/17 1335  LABPROT 12.1  INR 0.90    Imaging: No results found.  Medications:  I have reviewed the patient's current medications. Scheduled: . amLODipine  10 mg Oral Daily  . aspirin EC  81 mg Oral Daily  . atorvastatin  80 mg Oral q1800  . budesonide (PULMICORT) nebulizer solution  0.5 mg Nebulization BID  . dexamethasone  4 mg Intravenous Q12H  . enoxaparin (LOVENOX) injection  40 mg Subcutaneous Q24H  . hydrochlorothiazide  12.5 mg Oral Daily  . ipratropium-albuterol  3 mL Nebulization Q6H  .  pantoprazole  40 mg Oral Daily    Assessment/Plan: Patient without further seizures noted.  On Keppra and Depakote.  Depakote level 43.    Recommendations: 1.  Increase Depakote to 1000mg  BID.  Depakote level in AM 2.  D/C Keppra 3.  Continue seizure precautions   LOS: 2 days   Alexis Goodell, MD Neurology [phone] 04/17/2017  12:29 PM

## 2017-04-17 NOTE — Plan of Care (Signed)
  Education: Knowledge of General Education information will improve 04/17/2017 0441 - Progressing by Jeffie Pollock, RN   Health Behavior/Discharge Planning: Ability to manage health-related needs will improve 04/17/2017 0441 - Progressing by Jeffie Pollock, RN   Education: Expressions of having a comfortable level of knowledge regarding the disease process will increase 04/17/2017 0441 - Progressing by Jeffie Pollock, RN

## 2017-04-17 NOTE — NC FL2 (Signed)
[place] LEVEL OF CARE SCREENING TOOL     IDENTIFICATION  Patient Name: Jonathan Bowen Birthdate: February 08, 1946 Sex: male Admission Date (Current Location): 04/14/2017  [place] and [place] Number:  Engineering geologist and Address:  [facility], [address] [place], [place] [address]      Provider Number: [id number]  Attending Physician Name and Address:  Loletha Grayer, MD  Relative Name and Phone Number:  Lew, Prout Daughter [phone] or Shawon, Denzer [phone]     Current Level of Care: Hospital Recommended Level of Care: [place] Prior Approval Number:    Date Approved/Denied:   PASRR Number: [id number]  Discharge Plan: SNF    Current Diagnoses: Patient Active Problem List   Diagnosis Date Noted  . Accelerated hypertension 04/14/2017  . Headache 04/14/2017  . Seizure ([place]) 04/14/2017  . Overactive bladder 04/06/2017  . Agitation 04/06/2017  . Insomnia 04/06/2017  . Small cell lung cancer ([place]) 03/30/2017  . Goals of care, counseling/discussion 03/30/2017  . History of CVA (cerebrovascular accident) 06/29/2015  . Carotid artery stenosis 06/18/2015  . Hyperlipidemia   . Essential hypertension     Orientation RESPIRATION BLADDER Height & Weight     Self, Place  Normal Incontinent Weight: 230 lb (104.3 kg) Height:  5\' 7"  (170.2 cm)  BEHAVIORAL SYMPTOMS/MOOD NEUROLOGICAL BOWEL NUTRITION STATUS      Continent Diet  AMBULATORY STATUS COMMUNICATION OF NEEDS Skin   Limited Assist Verbally Normal                       Personal Care Assistance Level of Assistance  Bathing, Feeding, Dressing Bathing Assistance: Limited assistance Feeding assistance: Independent Dressing Assistance: Limited assistance     Functional Limitations Info  Sight, Speech, Hearing Sight Info: Adequate Hearing Info: Adequate Speech Info: Adequate    SPECIAL CARE FACTORS FREQUENCY  PT (By licensed PT), OT (By  licensed OT)     PT Frequency: 5x a week OT Frequency: 5x a week            Contractures Contractures Info: Not present    Additional Factors Info  Code Status, Allergies, Psychotropic Code Status Info: DNR Allergies Info: NKA Psychotropic Info: QUEtiapine (SEROQUEL) tablet 25 mg          Current Medications (04/17/2017):  This is the current hospital active medication list Current Facility-Administered Medications  Medication Dose Route Frequency Provider Last Rate Last Dose  . acetaminophen (TYLENOL) tablet 650 mg  650 mg Oral Q6H PRN Lance Coon, MD   650 mg at 04/16/17 9371   Or  . acetaminophen (TYLENOL) suppository 650 mg  650 mg Rectal Q6H PRN Lance Coon, MD      . amLODipine (NORVASC) tablet 10 mg  10 mg Oral Daily Lance Coon, MD   10 mg at 04/17/17 1042  . aspirin EC tablet 81 mg  81 mg Oral Daily Lance Coon, MD   81 mg at 04/17/17 1042  . atorvastatin (LIPITOR) tablet 80 mg  80 mg Oral q1800 Lance Coon, MD   80 mg at 04/16/17 2201  . budesonide (PULMICORT) nebulizer solution 0.5 mg  0.5 mg Nebulization BID Loletha Grayer, MD   0.5 mg at 04/16/17 2003  . dexamethasone (DECADRON) injection 4 mg  4 mg Intravenous Q12H Loletha Grayer, MD   4 mg at 04/17/17 1042  . enoxaparin (LOVENOX) injection 40 mg  40 mg Subcutaneous Q24H Lance Coon, MD   40 mg at 04/16/17 2204  .  haloperidol lactate (HALDOL) injection 1 mg  1 mg Intravenous Q6H PRN Loletha Grayer, MD   1 mg at 04/17/17 0716  . hydrALAZINE (APRESOLINE) injection 10 mg  10 mg Intravenous Q4H PRN Lance Coon, MD   10 mg at 04/15/17 1353  . hydrochlorothiazide (MICROZIDE) capsule 12.5 mg  12.5 mg Oral Daily Loletha Grayer, MD   12.5 mg at 04/17/17 1042  . ipratropium-albuterol (DUONEB) 0.5-2.5 (3) MG/3ML nebulizer solution 3 mL  3 mL Nebulization Q6H Loletha Grayer, MD   3 mL at 04/16/17 2003  . LORazepam (ATIVAN) injection 0.5 mg  0.5 mg Intravenous Q6H PRN Lance Coon, MD   0.5 mg at 04/17/17  1341  . LORazepam (ATIVAN) injection 2 mg  2 mg Intravenous Q4H PRN Lance Coon, MD   2 mg at 04/15/17 1537  . LORazepam (ATIVAN) tablet 0.5 mg  0.5 mg Oral Q6H PRN Lance Coon, MD   0.5 mg at 04/15/17 0840  . ondansetron (ZOFRAN) tablet 4 mg  4 mg Oral Q6H PRN Lance Coon, MD       Or  . ondansetron [facility]) injection 4 mg  4 mg Intravenous Q6H PRN Lance Coon, MD      . oxyCODONE (Oxy IR/ROXICODONE) immediate release tablet 5 mg  5 mg Oral Q4H PRN Lance Coon, MD   5 mg at 04/17/17 1042  . pantoprazole (PROTONIX) EC tablet 40 mg  40 mg Oral Daily Lance Coon, MD   40 mg at 04/17/17 1042  . QUEtiapine (SEROQUEL) tablet 25 mg  25 mg Oral QHS Wieting, Richard, MD      . traZODone (DESYREL) tablet 50 mg  50 mg Oral QHS PRN Lance Coon, MD   50 mg at 04/16/17 2216  . valproate (DEPACON) 1,000 mg in dextrose 5 % 50 mL IVPB  1,000 mg Intravenous Q12H Alexis Goodell, MD         Discharge Medications: Please see discharge summary for a list of discharge medications.  Relevant Imaging Results:  Relevant Lab Results:   Additional Information SSN: [id number]  Anell Barr

## 2017-04-17 NOTE — Progress Notes (Signed)
Patient ID: Jonathan Bowen, male   DOB: 1946/04/03, 72 y.o.   MRN: [id number]  [facility] PROGRESS NOTE  Levent Kornegay HKV:[id number] DOB: [birth year] DOA: 04/14/2017 PCP: [name], MD  HPI/Subjective: Patient flagged me down well is in the hallway.  I caught him at a coherent.  He told me that the bed is horrible and he got no sleep last night.  He states he wants a double bed.  He has been giving the nurses a hard time.  The patient's daughter states that he does not like the hospital.  Objective: Vitals:   04/16/17 2004 04/17/17 0606  BP: (!) 174/80 (!) 179/65  Pulse: 81 77  Resp: 18   Temp: (!) 97.5 F (36.4 C) (!) 97.4 F (36.3 C)  SpO2: 96% 95%    Filed Weights   04/14/17 1853  Weight: 104.3 kg (230 lb)    ROS: Review of Systems  Unable to perform ROS: Mental acuity  Respiratory: Negative for shortness of breath.   Cardiovascular: Negative for chest pain.  Gastrointestinal: Negative for abdominal pain.   Exam: Physical Exam  HENT:  Nose: No mucosal edema.  Mouth/Throat: No oropharyngeal exudate or posterior oropharyngeal edema.  Eyes: Conjunctivae and lids are normal. Pupils are equal, round, and reactive to light.  Neck: No JVD present. Carotid bruit is not present. No edema present. No thyroid mass and no thyromegaly present.  Cardiovascular: S1 normal and S2 normal. Exam reveals no gallop.  No murmur heard. Pulses:      Dorsalis pedis pulses are 2+ on the right side, and 2+ on the left side.  Respiratory: No respiratory distress. He has decreased breath sounds in the right lower field and the left lower field. He has wheezes in the right lower field and the left lower field. He has no rhonchi. He has no rales.  GI: Soft. Bowel sounds are normal. There is no tenderness.  Musculoskeletal:       Right ankle: He exhibits swelling.       Left ankle: He exhibits swelling.  Lymphadenopathy:    He has no cervical adenopathy.  Neurological: He is alert.   Patient able to move all extremities today.  Still 4 out of 5 weakness all over.  Skin: Skin is warm. No rash noted. Nails show no clubbing.  Psychiatric:  Patient able to answer a few yes or no questions      Data Reviewed: Basic Metabolic Panel: Recent Labs  Lab 04/14/17 1335 04/15/17 0445 04/16/17 0602 04/17/17 0550  NA 131* 132* 133* 132*  K 4.7 4.9 4.9 5.0  CL 100* 100* 102 100*  CO2 24 25 23 24   GLUCOSE 135* 139* 111* 124*  BUN 35* 38* 43* 39*  CREATININE 1.16 1.03 1.02 1.00  CALCIUM 8.1* 7.8* 8.0* 8.2*   CBC: Recent Labs  Lab 04/14/17 1335 04/14/17 2003 04/15/17 0445  WBC 12.2* 16.1* 11.4*  NEUTROABS 11.1*  --   --   HGB 14.6 15.8 13.6  HCT 45.4 47.3 40.8  MCV 88.3 86.5 87.1  PLT 154 166 124*   . Scheduled Meds: . amLODipine  10 mg Oral Daily  . aspirin EC  81 mg Oral Daily  . atorvastatin  80 mg Oral q1800  . budesonide (PULMICORT) nebulizer solution  0.5 mg Nebulization BID  . dexamethasone  4 mg Intravenous Q12H  . enoxaparin (LOVENOX) injection  40 mg Subcutaneous Q24H  . hydrochlorothiazide  12.5 mg Oral Daily  . ipratropium-albuterol  3 mL  Nebulization Q6H  . pantoprazole  40 mg Oral Daily   Continuous Infusions: . valproate sodium      Assessment/Plan:  1. Seizure with prolonged postictal period.  Patient has a history of brain metastases.  Patient was placed on IV Keppra in the ER and this was discontinued today.  Neurology is increasing the valproic acid.  Level of valproic acid is still lower than the therapeutic range..  Patient doing better today than on admission. 2. 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  Case discussed with daughter on the phone.  They did speak with Dr. Claudie Leach radiation oncology for palliative brain radiation. 3. COPD exacerbation on steroids for brain metastases already.  Continue DuoNeb and budesonide  nebulizers. 4. Accelerated hypertension.  Blood pressure variable with mood.  On hydrochlorothiazide and Norvasc 5. History of stroke on aspirin and Lipitor 6. Hyperlipidemia unspecified on atorvastatin 7. GERD on Protonix 8. Weakness physical therapy recommended rehab.  May be difficult placement while getting brain radiation.  Case discussed with daughter.  Patient states that he did not want rehab.  Family will discuss with themselves on whether or not they can handle him at home or not.  Would like physical therapy reevaluation since the patient's mental status is a little bit better.  Code Status:     Code Status Orders  (From admission, onward)        Start     Ordered   04/15/17 0119  Do not attempt resuscitation (DNR)  Continuous    Question Answer Comment  In the event of cardiac or respiratory ARREST Do not call a "code blue"   In the event of cardiac or respiratory ARREST Do not perform Intubation, CPR, defibrillation or ACLS   In the event of cardiac or respiratory ARREST Use medication by any route, position, wound care, and other measures to relive pain and suffering. May use oxygen, suction and manual treatment of airway obstruction as needed for comfort.      04/15/17 0118    Code Status History    Date Active Date Inactive Code Status Order ID Comments User Context   04/15/2017 00:25 04/15/2017 01:18 Full Code [id number]  Lance Coon, MD Inpatient   05/21/2015 15:07 05/24/2015 20:23 Full Code [id number]  Francesca Oman, DO Inpatient    Advance Directive Documentation     Most Recent Value  Type of Advance Directive  Healthcare Power of Attorney, Living will  Pre-existing out of facility DNR order (yellow form or pink MOST form)  No data  "MOST" Form in Place?  No data     Family Communication: Spoke with daughter on the phone Disposition Plan: Potential rehab  Consultants: -Neuro -oncology -Radiation oncology Dr. Claudie Leach.  Time spent: 26 minutes  [place]

## 2017-04-18 ENCOUNTER — Inpatient Hospital Stay: Payer: Medicare Other

## 2017-04-18 ENCOUNTER — Ambulatory Visit: Payer: Medicare Other

## 2017-04-18 ENCOUNTER — Ambulatory Visit: Payer: Medicare Other | Admitting: Radiation Oncology

## 2017-04-18 ENCOUNTER — Inpatient Hospital Stay: Payer: Medicare Other | Admitting: Oncology

## 2017-04-18 DIAGNOSIS — M549 Dorsalgia, unspecified: Secondary | ICD-10-CM

## 2017-04-18 DIAGNOSIS — C3490 Malignant neoplasm of unspecified part of unspecified bronchus or lung: Secondary | ICD-10-CM

## 2017-04-18 DIAGNOSIS — G40909 Epilepsy, unspecified, not intractable, without status epilepticus: Secondary | ICD-10-CM

## 2017-04-18 LAB — BASIC METABOLIC PANEL
ANION GAP: 7 (ref 5–15)
BUN: 43 mg/dL — ABNORMAL HIGH (ref 6–20)
CO2: 25 mmol/L (ref 22–32)
Calcium: 8.2 mg/dL — ABNORMAL LOW (ref 8.9–10.3)
Chloride: 102 mmol/L (ref 101–111)
Creatinine, Ser: 1.03 mg/dL (ref 0.61–1.24)
GFR calc non Af Amer: >60 mL/min (ref >60)
Glucose, Bld: 99 mg/dL (ref 65–99)
Potassium: 4.3 mmol/L (ref 3.5–5.1)
SODIUM: 134 mmol/L — AB (ref 135–145)

## 2017-04-18 LAB — VALPROIC ACID LEVEL: Valproic Acid Lvl: 57 ug/mL (ref 50.0–100.0)

## 2017-04-18 MED ORDER — LORAZEPAM 0.5 MG PO TABS: 0.5000 mg | ORAL_TABLET | Freq: Four times a day (QID) | ORAL | 0 refills | Status: AC | PRN

## 2017-04-18 MED ORDER — BISACODYL 10 MG RE SUPP
10.0000 mg | Freq: Once | RECTAL | Status: DC
  Filled 2017-04-18: qty 1

## 2017-04-18 MED ORDER — QUETIAPINE FUMARATE 25 MG PO TABS: 25.0000 mg | ORAL_TABLET | Freq: Every day | ORAL | 0 refills | Status: AC

## 2017-04-18 MED ORDER — DIVALPROEX SODIUM 500 MG PO DR TAB: 1000.0000 mg | DELAYED_RELEASE_TABLET | Freq: Two times a day (BID) | ORAL | 0 refills | Status: AC

## 2017-04-18 MED ORDER — IPRATROPIUM-ALBUTEROL 0.5-2.5 (3) MG/3ML IN SOLN: 3.0000 mL | Freq: Four times a day (QID) | RESPIRATORY_TRACT | Status: DC | PRN

## 2017-04-18 MED ORDER — DIVALPROEX SODIUM 500 MG PO DR TAB
1000.0000 mg | DELAYED_RELEASE_TABLET | Freq: Two times a day (BID) | ORAL | Status: DC
  Filled 2017-04-18: qty 2

## 2017-04-18 MED ORDER — MORPHINE SULFATE (CONCENTRATE) 10 MG/0.5ML PO SOLN: 5.0000 mg | ORAL | 0 refills | Status: AC | PRN

## 2017-04-18 MED ORDER — HALOPERIDOL LACTATE 5 MG/ML IJ SOLN
1.0000 mg | Freq: Once | INTRAMUSCULAR | Status: AC
  Administered 2017-04-18: 1 mg via INTRAMUSCULAR
  Filled 2017-04-18: qty 1

## 2017-04-18 MED ORDER — MORPHINE SULFATE (CONCENTRATE) 10 MG/0.5ML PO SOLN: 5.0000 mg | ORAL | Status: DC | PRN

## 2017-04-18 NOTE — Progress Notes (Signed)
[facility]  [phone] [phone]) [phone]  ID: Jonathan Bowen OB: June 26, 1945  MR#: [id number]  RXV#:[id number]  Patient Care Team: [name], MD as PCP - General (Family Medicine) Telford Nab, RN as Registered Nurse  CHIEF COMPLAINT:   INTERVAL HISTORY: Patient remains agitated and intermittently confused, but has had no recent seizures and currently denies pain.  Patient requesting a desire to go home today.  REVIEW OF SYSTEMS:   Review of Systems  Constitutional: Positive for malaise/fatigue. Negative for fever and weight loss.  Eyes: Negative.   Respiratory: Negative.  Negative for cough, hemoptysis and shortness of breath.   Cardiovascular: Negative.  Negative for chest pain and leg swelling.  Gastrointestinal: Negative.  Negative for abdominal pain.  Musculoskeletal: Positive for back pain.  Skin: Negative.  Negative for rash.  Neurological: Positive for seizures, weakness and headaches. Negative for sensory change and focal weakness.  Psychiatric/Behavioral: Positive for memory loss.    As per HPI. Otherwise, a complete review of systems is negative.  PAST MEDICAL HISTORY: Past Medical History:  Diagnosis Date  . Agitation   . Complication of anesthesia    has never had surgery  . Dementia 2018  . Hyperlipidemia   . Hypertension   . Small cell lung cancer ([place])    with brain mets 03/2017   . Smoker   . Stroke [facility])    just happened in early February  . Urinary incontinence     PAST SURGICAL HISTORY: Past Surgical History:  Procedure Laterality Date  . ENDARTERECTOMY Right 06/18/2015   Procedure: ENDARTERECTOMY RIGHT CAROTID;  Surgeon: Rosetta Posner, MD;  Location: [facility];  Service: Vascular;  Laterality: Right;  . PATCH ANGIOPLASTY Right 06/18/2015   Procedure: PATCH ANGIOPLASTY RIGHT CAROTID ARTERY;  Surgeon: Rosetta Posner, MD;  Location: [place];  Service: Vascular;  Laterality: Right;  . PORTA CATH INSERTION N/A 04/13/2017     Procedure: PORTA CATH INSERTION;  Surgeon: Katha Cabal, MD;  Location: [facility];  Service: Cardiovascular;  Laterality: N/A;    FAMILY HISTORY: Family History  Problem Relation Age of Onset  . Diabetes Mother   . Cancer Father        Unknown of the type of cancer   . Diabetes Sister   . ALS Brother     ADVANCED DIRECTIVES (Y/N):  @ADVDIR @  HEALTH MAINTENANCE: Social History   Tobacco Use  . Smoking status: Former Smoker    Packs/day: 3.00    Years: 50.00    Pack years: 150.00    Last attempt to quit: 05/28/2015    Years since quitting: 1.8  . Smokeless tobacco: Never Used  Substance Use Topics  . Alcohol use: No  . Drug use: No     Colonoscopy:  PAP:  Bone density:  Lipid panel:  No Known Allergies  Current Facility-Administered Medications  Medication Dose Route Frequency Provider Last Rate Last Dose  . acetaminophen (TYLENOL) tablet 650 mg  650 mg Oral Q6H PRN Lance Coon, MD   650 mg at 04/16/17 5093   Or  . acetaminophen (TYLENOL) suppository 650 mg  650 mg Rectal Q6H PRN Lance Coon, MD      . amLODipine (NORVASC) tablet 10 mg  10 mg Oral Daily Lance Coon, MD   10 mg at 04/18/17 0842  . aspirin EC tablet 81 mg  81 mg Oral Daily Lance Coon, MD   81 mg at 04/18/17 2671  . bisacodyl (DULCOLAX) suppository 10  mg  10 mg Rectal Once Dustin Flock, MD      . budesonide (PULMICORT) nebulizer solution 0.5 mg  0.5 mg Nebulization BID Loletha Grayer, MD   0.5 mg at 04/16/17 2003  . dexamethasone (DECADRON) injection 4 mg  4 mg Intravenous Q12H Loletha Grayer, MD   4 mg at 04/18/17 0843  . divalproex (DEPAKOTE) DR tablet 1,000 mg  1,000 mg Oral Q12H Alexis Goodell, MD      . haloperidol lactate (HALDOL) injection 1 mg  1 mg Intravenous Q6H PRN Loletha Grayer, MD   1 mg at 04/18/17 0531  . hydrochlorothiazide (MICROZIDE) capsule 12.5 mg  12.5 mg Oral Daily Loletha Grayer, MD   12.5 mg at 04/18/17 0842  . ipratropium-albuterol  (DUONEB) 0.5-2.5 (3) MG/3ML nebulizer solution 3 mL  3 mL Nebulization Q6H PRN Dustin Flock, MD      . LORazepam (ATIVAN) tablet 0.5 mg  0.5 mg Oral Q6H PRN Lance Coon, MD   0.5 mg at 04/18/17 0511  . morphine CONCENTRATE 10 MG/0.5ML oral solution 5 mg  5 mg Oral Q3H PRN Asencion Gowda, NP      . ondansetron (ZOFRAN) tablet 4 mg  4 mg Oral Q6H PRN Lance Coon, MD       Or  . ondansetron [facility]) injection 4 mg  4 mg Intravenous Q6H PRN Lance Coon, MD      . pantoprazole (PROTONIX) EC tablet 40 mg  40 mg Oral Daily Lance Coon, MD   40 mg at 04/18/17 [phone]  . QUEtiapine (SEROQUEL) tablet 25 mg  25 mg Oral QHS Loletha Grayer, MD   25 mg at 04/17/17 2000  . traZODone (DESYREL) tablet 50 mg  50 mg Oral QHS PRN Lance Coon, MD   50 mg at 04/17/17 1934    OBJECTIVE: Vitals:   04/18/17 0848 04/18/17 1336  BP: (!) 115/56 104/73  Pulse: 92 (!) 101  Resp:  16  Temp:  97.7 F (36.5 C)  SpO2:  92%     Body mass index is 36.02 kg/m.    ECOG FS:2 - Symptomatic, <50% confined to bed  General: Well-developed, well-nourished, no acute distress. Eyes: Pink conjunctiva, anicteric sclera. Lungs: Clear to auscultation bilaterally. Heart: Regular rate and rhythm. No rubs, murmurs, or gallops. Abdomen: Soft, nontender, nondistended. No organomegaly noted, normoactive bowel sounds. Musculoskeletal: No edema, cyanosis, or clubbing. Neuro: Agitated, intermittently confused. Cranial nerves grossly intact. Skin: No rashes or petechiae noted. Psych: Normal affect.    LAB RESULTS:  Lab Results  Component Value Date   NA 134 (L) 04/18/2017   K 4.3 04/18/2017   CL 102 04/18/2017   CO2 25 04/18/2017   GLUCOSE 99 04/18/2017   BUN 43 (H) 04/18/2017   CREATININE 1.03 04/18/2017   CALCIUM 8.2 (L) 04/18/2017   PROT 6.3 (L) 03/30/2017   ALBUMIN 3.6 03/30/2017   AST 17 03/30/2017   ALT 30 03/30/2017   ALKPHOS 65 03/30/2017   BILITOT 0.32 03/30/2017   GFRNONAA >60 04/18/2017   GFRAA  >60 04/18/2017    Lab Results  Component Value Date   WBC 11.4 (H) 04/15/2017   NEUTROABS 11.1 (H) 04/14/2017   HGB 13.6 04/15/2017   HCT 40.8 04/15/2017   MCV 87.1 04/15/2017   PLT 124 (L) 04/15/2017     STUDIES: Ct Head Wo Contrast  Result Date: 04/14/2017 CLINICAL DATA:  Headache. Metastatic lung cancer. Rule out subarachnoid hemorrhage EXAM: CT HEAD WITHOUT CONTRAST TECHNIQUE: Contiguous axial images were obtained from the base  of the skull through the vertex without intravenous contrast. COMPARISON:  MRI head 03/26/2017 FINDINGS: Brain: Mass lesion left lateral cerebellum measures 36 x 42 mm similar in size to the prior study. This has mixed cystic and solid component. Increased mass-effect on the fourth ventricle which is displaced to the right. No obstructive hydrocephalus. Left parietal convexity hemorrhagic metastatic deposit measures 28 x 22 mm and is increased in size. Surrounding edema has improved. No new mass lesions identified. Generalized atrophy. Chronic infarcts in the left cerebral white matter and left basal ganglia. Negative for acute infarct. Vascular: Negative for hyperdense vessel Skull: Negative Sinuses/Orbits: Air-fluid level right maxillary sinus Other: None IMPRESSION: Metastatic lesions in the left cerebellum and left parietal convexity with interval enlargement since recent studies compatible with progression of tumor. Progressive mass-effect on the fourth ventricle without obstructive hydrocephalus. Improvement in vasogenic edema in the left parietal lobe likely related to interval steroid treatment. Chronic infarct left basal ganglia and deep white matter. No acute infarct. Electronically Signed   By: Franchot Gallo M.D.   On: 04/14/2017 14:28   Ct Head Wo Contrast  Result Date: 03/21/2017 CLINICAL DATA:  Slurred speech EXAM: CT HEAD WITHOUT CONTRAST TECHNIQUE: Contiguous axial images were obtained from the base of the skull through the vertex without intravenous  contrast. COMPARISON:  CT 05/21/2015.  MRI to 05/22/2015. FINDINGS: Brain: Abnormal low-density noted within the left cerebellar hemisphere, new since prior studies. There is also new low-density throughout the deep white matter within the left parietal lobe, surrounding what appears to be a focal lesion in the posterior parietal lobe measuring 2.3 x 2.3 cm. Findings are concerning for metastases in these 2 areas. Changes of old left MCA infarct in the basal ganglia and periventricular white matter on the left. No hemorrhage or hydrocephalus. Vascular: No hyperdense vessel or unexpected calcification. Skull: No acute calvarial abnormality. Sinuses/Orbits: Visualized paranasal sinuses and mastoids clear. Orbital soft tissues unremarkable. Other: None IMPRESSION: Edema noted within the left parietal lobe around a 2.3 cm mass lesion. There is also edema within the left cerebellar hemisphere, likely also related to an underlying mass lesion. Findings concerning for metastases. These could be further evaluated with MRI with and without contrast. These results were called by telephone at the time of interpretation on 03/21/2017 at 10:52 am to Dr. Carrie Mew , who verbally acknowledged these results. Electronically Signed   By: Rolm Baptise M.D.   On: 03/21/2017 10:53   Nm Pet Image Initial (pi) Skull Base To Thigh  Result Date: 04/03/2017 CLINICAL DATA:  Initial treatment strategy for small cell carcinoma of the lung, metastatic to the brain EXAM: NUCLEAR MEDICINE PET SKULL BASE TO THIGH TECHNIQUE: 13.3 mCi F-18 FDG was injected intravenously. Full-ring PET imaging was performed from the skull base to thigh after the radiotracer. CT data was obtained and used for attenuation correction and anatomic localization. FASTING BLOOD GLUCOSE:  Value: 113 mg/dl COMPARISON:  Multiple exams, including 03/22/2017 FINDINGS: NECK AND HEAD Primarily hypermetabolic mass of the left parietal lobe with surrounding vasogenic  edema, maximum SUV 10.4. Cystic and solid mass of the left lateral cerebellum, maximum SUV in the solid elements 8.8. Asymmetric hypoactivity in the left lentiform nuclei due to prior infarcts. The smaller potential lesion in the right frontal lobe is not appreciably hypermetabolic but is likely below sensitive PET-CT size thresholds. 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 No hypermetabolic adenopathy in the neck or other abnormal metabolic activity in the neck. Mild chronic left maxillary sinusitis.  CHEST The dominant left lower lobe pulmonary nodule measures 2.4 by 2.1 cm on image 136/3, and has a maximum standard uptake value of 7.5. Bilateral airway thickening especially in the lower lobes. No hypermetabolic or pathologically enlarged adenopathy observed in the chest. Atherosclerotic calcification of the aortic arch and branch vessels. ABDOMEN/PELVIS No abnormal hypermetabolic activity within the liver, pancreas, adrenal glands, or spleen. No hypermetabolic lymph nodes in the abdomen or pelvis. Focal accentuated activity at the level of the anal sphincter without appreciable CT abnormality, maximum SUV 7.1, most likely to be physiologic. Incidental fat density in the descending duodenum compatible with a small lipoma. Aortoiliac atherosclerotic vascular disease. Sigmoid colon diverticulosis. Curvilinear calcification in the prostate gland. No significant abnormal hypermetabolic activity in the vicinity of the 8 by 7 mm pancreatic head hypodense lesion. The 0.8 by 0.6 cm exophytic lesion from the right kidney lower pole is mildly hyperdense precontrast and is probably a complex cyst, but technically too small to characterize. SKELETON Mildly accentuated focal activity in the thoracic spine at the T5-6 level eccentric to the right, maximum SUV 3.8, without appreciable CT correlate. Activity at other vertebral levels is in the 3.4 range, hence this may simply be incidental or due to degenerative findings.  IMPRESSION: 1. Hypermetabolic left lower lobe pulmonary nodule, maximum SUV 7.5, compatible with malignancy. 2. Hypermetabolic masses in the left parietal lobe and left cerebellum favoring metastatic lesions. A questionably enhancing lesion in the right frontal lobe is not appreciably hypermetabolic on PET-CT, but is probably below sensitive PET-CT size thresholds. 3. No findings of hypermetabolic adenopathy in the chest, or metastatic disease to the abdomen/pelvis. 4. Mildly accentuated focal activity eccentric to the right at the T5-6 level, barely above background activity level, probably due to spondylosis or incidental given that there is no correlate on the CT data. This area may warrant surveillance on follow up imaging. 5. Small hypodense lesion in the pancreatic head is not appreciably hypermetabolic, but may warrant surveillance. 6. Small complex lesion of the right kidney demonstrates hyperdensity on the noncontrast CT data, and is statistically most likely to be a small benign complex cyst, but also warrant surveillance. 7. Other imaging findings of potential clinical significance: Mild chronic left maxillary sinusitis. Aortic Atherosclerosis (ICD10-I70.0). Sigmoid colon diverticulosis. Old left lentiform nucleus infarcts. Electronically Signed   By: Van Clines M.D.   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  PLAN:    1. 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  Plan is to discharge home today with hospice.  Placement at a long-term facility with hospice following or possibly hospice home was discussed, but it was decided upon the patient will go home today.  No further interventions are needed.  Patient does not  require follow-up in the cancer center.  Case also discussed with nursing and palliative care.  Appreciate their input. 2.  Seizures: Appreciate neurology input.  Patient has not had any recent seizures.  Continue current dose of Depakote.  Appreciate consult, call with questions.   Cancer Staging Small cell lung cancer [facility]) Staging form: Lung, AJCC 8th Edition - Clinical stage from 04/06/2017: Stage IV (cT1, cN0, pM1c) - Signed by Lloyd Huger, MD on 04/06/2017   Lloyd Huger,  MD   04/18/2017 1:52 PM

## 2017-04-18 NOTE — Consult Note (Signed)
SLP Cancellation Note  Patient Details Name: Jonathan Bowen MRN: [id number] DOB: Aug 16, 1945   Cancelled treatment:        Orders for ST intervention have been discontinued. Please reconsult if needs arise. Signing off.  Violett Hobbs B. Quentin Ore [facility], [place] Speech Language Pathologist 3606  Shonna Chock 04/18/2017, 1:08 PM

## 2017-04-18 NOTE — Progress Notes (Signed)
Subjective: No further seizures noted.  Patient less agitated today but still wishes to go home.    Objective: Current vital signs: BP (!) 115/56   Pulse 92   Temp 97.7 F (36.5 C) (Oral)   Resp 20   Ht 5\' 7"  (1.702 m)   Wt 104.3 kg (230 lb)   SpO2 92%   BMI 36.02 kg/m  Vital signs in last 24 hours: Temp:  [97.7 F (36.5 C)-97.8 F (36.6 C)] 97.7 F (36.5 C) (01/02 0503) Pulse Rate:  [80-92] 92 (01/02 0848) Resp:  [20] 20 (01/02 0503) BP: (115-148)/(56-90) 115/56 (01/02 0848) SpO2:  [92 %-94 %] 92 % (01/02 0503)  Intake/Output from previous day: 01/01 0701 - 01/02 0700 In: 420 [P.O.:360; IV Piggyback:60] Out: -  Intake/Output this shift: No intake/output data recorded. Nutritional status: Diet regular Room service appropriate? Yes; Fluid consistency: Thin  Neurologic Exam: Mental Status: Lethargic.  Easily alerted.  Speech slurred but fluent.  Follows simple commands.   Cranial Nerves: II: Discs flat bilaterally;Blinks to bilateral confrontation, pupils equal, round, reactive to light and accommodation III,IV, VI: ptosis not present, extra-ocular motions intact bilaterally V,VII:decrease in right NLF, facial light touch sensation normal bilaterally VIII: hearing normal bilaterally IX,X: gag reflex present XI: bilateral shoulder shrug XII: midline tongue extension Motor: Right :Upper extremity 5-/5Left: Upper extremity 5/5 Lower extremity 4-/5Lower extremity Able to lift off the bed and maintain   Lab Results: Basic Metabolic Panel: Recent Labs  Lab 04/14/17 1335 04/15/17 0445 04/16/17 0602 04/17/17 0550  NA 131* 132* 133* 132*  K 4.7 4.9 4.9 5.0  CL 100* 100* 102 100*  CO2 24 25 23 24   GLUCOSE 135* 139* 111* 124*  BUN 35* 38* 43* 39*  CREATININE 1.16 1.03 1.02 1.00  CALCIUM 8.1* 7.8* 8.0* 8.2*    Liver Function Tests: No results for  input(s): AST, ALT, ALKPHOS, BILITOT, PROT, ALBUMIN in the last 168 hours. No results for input(s): LIPASE, AMYLASE in the last 168 hours. No results for input(s): AMMONIA in the last 168 hours.  CBC: Recent Labs  Lab 04/14/17 1335 04/14/17 2003 04/15/17 0445  WBC 12.2* 16.1* 11.4*  NEUTROABS 11.1*  --   --   HGB 14.6 15.8 13.6  HCT 45.4 47.3 40.8  MCV 88.3 86.5 87.1  PLT 154 166 124*    Cardiac Enzymes: No results for input(s): CKTOTAL, CKMB, CKMBINDEX, TROPONINI in the last 168 hours.  Lipid Panel: No results for input(s): CHOL, TRIG, HDL, CHOLHDL, VLDL, LDLCALC in the last 168 hours.  CBG: No results for input(s): GLUCAP in the last 168 hours.  Microbiology: Results for orders placed or performed during the hospital encounter of 04/14/17  Urine Culture     Status: None   Collection Time: 04/14/17  8:03 PM  Result Value Ref Range Status   Specimen Description   Final    URINE, RANDOM Performed at [facility], [address] [place], [place] [address]    Special Requests   Final    NONE Performed at [facility], [address] [place], [place] [address]    Culture   Final    NO GROWTH Performed at [facility], [place] [address], [place], [place] [address]    Report Status 04/16/2017 FINAL  Final    Coagulation Studies: No results for input(s): LABPROT, INR in the last 72 hours.  Imaging: No results found.  Medications:  I have reviewed the patient's current medications. Scheduled: . amLODipine  10 mg Oral Daily  . aspirin EC  81 mg Oral Daily  . atorvastatin  80 mg Oral q1800  . budesonide (PULMICORT) nebulizer solution  0.5 mg Nebulization BID  . dexamethasone  4 mg Intravenous Q12H  . divalproex  1,000 mg Oral Q12H  . enoxaparin (LOVENOX) injection  40 mg Subcutaneous Q24H  . hydrochlorothiazide  12.5 mg Oral Daily  . ipratropium-albuterol  3 mL Nebulization Q6H  . pantoprazole  40 mg Oral Daily  . QUEtiapine  25 mg Oral  QHS    Assessment/Plan: No further seizures.  Less agitated this morning.  Keppra discontinued.  Depakote level 57.    Recommendations: 1.  Patient to be changed to po Depakote today.    No further neurologic intervention is recommended at this time.  If further questions arise, please call or page at that time.  Thank you for allowing neurology to participate in the care of this patient.    LOS: 3 days   Alexis Goodell, MD Neurology [phone] 04/18/2017  10:13 AM

## 2017-04-18 NOTE — Consult Note (Signed)
SLP Cancellation Note  Patient Details Name: Jonathan Bowen MRN: [id number] DOB: [birth year]   Cancelled treatment:        Attempted to see pt for assessment of diet tolerance and education, however, pt is currently unavailable, due to nursing needs. Will continue efforts.  Makai Agostinelli B. Quentin Ore [facility], [place] Speech Language Pathologist 3606  Shonna Chock 04/18/2017, 11:57 AM

## 2017-04-18 NOTE — Care Management Important Message (Signed)
Important Message  Patient Details  Name: Jonathan Bowen MRN: [id number] Date of Birth: [birth year]   Medicare Important Message Given:  Yes    Shelbie Ammons, RN 04/18/2017, 7:32 AM

## 2017-04-18 NOTE — Progress Notes (Addendum)
Daily Progress Note   Patient Name: Jonathan Bowen       Date: 04/18/2017 DOB: 20-Feb-1946  Age: 72 y.o. MRN#: [id number] Attending Physician: Dustin Flock, MD Primary Care Physician: [name], MD Admit Date: 04/14/2017  Reason for Consultation/Follow-up: Establishing goals of care  Subjective: Jonathan Bowen is resting in bed. He has rail pads and has mittens in place. He states "please get me out of here".  Per nursing, he's removed his port accessed IV. Called to speak with daughter Jonathan Bowen), she states she is not sure if she wants the palliative radiation or hospice comfort care.  Oncology speaking with daughter over the phone at this time. Daughter requesting  Hospice care and wants to take him home with hospice to follow.        Length of Stay: 3  Current Medications: Scheduled Meds:  . amLODipine  10 mg Oral Daily  . aspirin EC  81 mg Oral Daily  . atorvastatin  80 mg Oral q1800  . bisacodyl  10 mg Rectal Once  . budesonide (PULMICORT) nebulizer solution  0.5 mg Nebulization BID  . dexamethasone  4 mg Intravenous Q12H  . divalproex  1,000 mg Oral Q12H  . enoxaparin (LOVENOX) injection  40 mg Subcutaneous Q24H  . hydrochlorothiazide  12.5 mg Oral Daily  . pantoprazole  40 mg Oral Daily  . QUEtiapine  25 mg Oral QHS    Continuous Infusions:   PRN Meds: acetaminophen **OR** acetaminophen, haloperidol lactate, hydrALAZINE, ipratropium-albuterol, LORazepam, LORazepam, LORazepam, ondansetron **OR** ondansetron (ZOFRAN) IV, oxyCODONE, traZODone  Physical Exam  Constitutional: No distress.  Pulmonary/Chest: Effort normal.  Skin: Skin is warm and dry.            Vital Signs: BP (!) 115/56   Pulse 92   Temp 97.7 F (36.5 C) (Oral)   Resp 20   Ht 5\' 7"  (1.702 m)   Wt  104.3 kg (230 lb)   SpO2 92%   BMI 36.02 kg/m  SpO2: SpO2: 92 % O2 Device: O2 Device: Not Delivered O2 Flow Rate: O2 Flow Rate (L/min): 4 L/min  Intake/output summary:   Intake/Output Summary (Last 24 hours) at 04/18/2017 1231 Last data filed at 04/18/2017 0309 Gross per 24 hour  Intake 300 ml  Output -  Net 300 ml   LBM: Last BM  Date: (unknown) Baseline Weight: Weight: 104.3 kg (230 lb) Most recent weight: Weight: 104.3 kg (230 lb)       Palliative Assessment/Data: 30%    Flowsheet Rows     Most Recent Value  Intake Tab  Referral Department  Hospitalist  Unit at Time of Referral  Med/Surg Unit  Palliative Care Primary Diagnosis  Cancer  Date Notified  04/16/18  Palliative Care Type  New Palliative care  Reason for referral  Clarify Goals of Care  Date of Admission  04/14/18  Date first seen by Palliative Care  04/16/18  # of days Palliative referral response time  0 Day(s)  # of days IP prior to Palliative referral  2  Clinical Assessment  Psychosocial & Spiritual Assessment  Palliative Care Outcomes      Patient Active Problem List   Diagnosis Date Noted  . Accelerated hypertension 04/14/2017  . Headache 04/14/2017  . Seizure ([place]) 04/14/2017  . Overactive bladder 04/06/2017  . Agitation 04/06/2017  . Insomnia 04/06/2017  . 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 Goals of care, counseling/discussion 03/30/2017  . History of CVA (cerebrovascular accident) 06/29/2015  . Carotid artery stenosis 06/18/2015  . Hyperlipidemia   . 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 Patient was seen  earlier same day in the ED for similar complaints.   Assessment: Jonathan Bowen is confused.   Recommendations/Plan:  Home with hospice   Code Status:    Code Status Orders  (From admission, onward)          Start     Ordered   04/15/17 0119  Do not attempt resuscitation (DNR)  Continuous    Question Answer Comment  In the event of cardiac or respiratory ARREST Do not call a "code blue"   In the event of cardiac or respiratory ARREST Do not perform Intubation, CPR, defibrillation or ACLS   In the event of cardiac or respiratory ARREST Use medication by any route, position, wound care, and other measures to relive pain and suffering. May use oxygen, suction and manual treatment of airway obstruction as needed for comfort.      04/15/17 0118    Code Status History    Date Active Date Inactive Code Status Order ID Comments User Context   04/15/2017 00:25 04/15/2017 01:18 Full Code [id number]  Lance Coon, MD Inpatient   05/21/2015 15:07 05/24/2015 20:23 Full Code [id number]  Francesca Oman, DO Inpatient    Advance Directive Documentation     Most Recent Value  Type of Advance Directive  Healthcare Power of Attorney, Living will  Pre-existing out of facility DNR order (yellow form or pink MOST form)  No data  "MOST" Form in Place?  No data       Prognosis:   < 4 weeks  Discharge Planning:  Home with Hospice  Care plan was discussed with Dr. Grayland Ormond. Primary RN.   Thank you for allowing the Palliative Medicine Team to assist in the care of this patient.   Time In: 12:10 Time Out: 1:00 Total Time 50 min Prolonged Time Billed  yes      Greater than 50%  of this time was spent counseling and coordinating care related to the above assessment and plan.  Asencion Gowda, NP  Please contact Palliative Medicine Team phone at [phone] for questions  and concerns.

## 2017-04-18 NOTE — Clinical Social Work Note (Signed)
CSW received referral for SNF.  Case discussed with case manager and plan is to discharge home with home hospice.  CSW to sign off please re-consult if social work needs arise.  Byrdie Miyazaki R. Bryson Palen, MSW, LCSWA [phone]  

## 2017-04-18 NOTE — Discharge Instructions (Signed)
[facility] [place] [facility] ° °DIET:  °Regular diet ° °DISCHARGE CONDITION:  °Stable ° °ACTIVITY:  °Activity as tolerated ° °OXYGEN:  °Home Oxygen: No. °  °Oxygen Delivery: room air ° °DISCHARGE LOCATION:  °home  ° ° °ADDITIONAL DISCHARGE INSTRUCTION: ° ° °If you experience worsening of your admission symptoms, develop shortness of breath, life threatening emergency, suicidal or homicidal thoughts you must seek medical attention immediately by calling 911 or calling your MD immediately  if symptoms less severe. ° °You Must read complete instructions/literature along with all the possible adverse reactions/side effects for all the Medicines you take and that have been prescribed to you. Take any new Medicines after you have completely understood and accpet all the possible adverse reactions/side effects.  ° °Please note ° °You were cared for by a hospitalist during your hospital stay. If you have any questions about your discharge medications or the care you received while you were in the hospital after you are discharged, you can call the unit and asked to speak with the hospitalist on call if the hospitalist that took care of you is not available. Once you are discharged, your primary care physician will handle any further medical issues. Please note that NO REFILLS for any discharge medications will be authorized once you are discharged, as it is imperative that you return to your primary care physician (or establish a relationship with a primary care physician if you do not have one) for your aftercare needs so that they can reassess your need for medications and monitor your lab values. ° ° °

## 2017-04-18 NOTE — Plan of Care (Signed)
Palliative PA and Dr. Grayland Ormond spoke to daughter, Amador Cunas, [place], about goals of care.  Radiation mapping was cancelled for today.  Patient is going home and will be followed by Hospice. Patient has been confused and agitated today.  Pulled the needle out of his port today.   Will review medications with daughter.  Liquid morphine and ativan scripts are written.  He did get up to chair with meals.  Poor PO intake today but according to nurse ate better yesterday.  He was oriented to self and place, not time and situation.  He's incontinent of B&B.  He will transport home with EMS.

## 2017-04-18 NOTE — Progress Notes (Signed)
Pt very agitated, combative and verbally aggressive during the night. Behavior seems to worsen in the am prior to shift change and in the evenings prior to shift change. Pt did sleep from 1030 pm until about 4 am.  Pt continues to yell loudly, swear and make inappropriate sexual comments to staff members. Attempts made to get pt to calm down without success. Pt disregards tele sitter. Safety rounder utilized as well.

## 2017-04-18 NOTE — Care Management Note (Signed)
Case Management Note  Patient Details  Name: Takashi Korol MRN: [id number] Date of Birth: 1945/12/08  Subjective/Objective:   Spoke with Asencion Gowda NP for Palliative, States that daugher, Damier Disano would like to take her father home with Hospice.                  Action/Plan: Telephone call to [employer] [employer] [phone]). Discussed hospice agencies. [facility]. Will update Flo Shanks RN [facility] representative. Will update Dr. Gloriajean Dell.   Expected Discharge Date:                  Expected Discharge Plan:     In-House Referral:   yes  Discharge planning Services   yes  Post Acute Care Choice:   yes Choice offered to:   daughter, Addie  DME Arranged:    DME Agency:     HH Arranged:   yes HH Agency:   [facility]  Status of Service:     If discussed at [employer] of Stay Meetings, dates discussed:    Additional Comments:  Shelbie Ammons, RN MSN CCM Care Management [phone] 04/18/2017, 1:19 PM

## 2017-04-18 NOTE — Progress Notes (Addendum)
New referral for [facility] services at home received from [facility] following a Palliative Medicine consult. Patient is a 72 year old man admitted to [facility] on 12/29 for new onset seizures. He has a known history of lung cancer with brain mets. Palliative Medicine was consulted for goals of care. Patient was originally going to [facility] palliative chemo/radiation, but after further discussions with patient and family the plan is now to discharge home with hospice services. Patient has been agitated today and required PRN doses of haldol and lorazepam. Writer did not engage patient, but did speak with his daughter Amador Cunas both via telephone and in person to discuss hospice services. She has requested a hospital bed for delivery today. Patient information faxed to referral. Hospital bed ordered. Emotional support given. Plan is for discharge home via EMS today. Signed DNR in place in patient's discharge packet. Addie has been instructed to contact hospice when patient arrives home. Hospice information and contact number given to Addie.  Flo Shanks RN, BSN, [facility] [place], hospital liaison [phone]

## 2017-04-18 NOTE — Discharge Summary (Signed)
[facility], [place] y.o., DOB 1945/10/18, MRN [id number]. Admission date: 04/14/2017 Discharge Date 04/18/2017 Primary MD [name], MD Admitting Physician Lance Coon, MD  Admission Diagnosis  Syncope, unspecified syncope type [R55] Acute nonintractable headache, unspecified headache type [R51] Intractable vomiting with nausea, unspecified vomiting type [R11.2] Hypertension, unspecified type [I10]  Discharge Diagnosis   Principal Problem: Accelerated hypertension Acute encephalopathy Seizure with postictal state Small cell lung cancer with brain metastases Headache Acute on chronic COPD exasperation   previous history of stroke Hyperlipidemia  GERD   Hospital Course  Jonathan Bowen  is a 72 y.o. male who presents with headache and elevated blood pressure.  Patient was seen here in the ED earlier today with headache and was discharged home with Tylenol.  Family states this did not help him at home and his headache got worse so they brought him back to the ED. he was also noticed to have tonic-clonic seizure and had to receive Ativan.  He was admitted for further evaluation.  Further workup revealed worsening of metastatic lesion in the brain.  He was continued on Decadron.  He was seen by neurology who placed him on Depakote.  Patient continued to be very agitated and wanted to go home.  He was seen by physical therapy the recommended skilled nursing facility however patient did not want to go to a skilled nursing facility.  He was seen in consultation by palliative care today and family has decided to take him home with hospice.  He will not be able to use stay still for the radiation therapy.  Prognosis is very poor.               Consults neurology oncology  Significant Tests:  See full reports for all details    Ct Head Wo Contrast  Result Date: 04/14/2017 CLINICAL DATA:  Headache. Metastatic lung cancer. Rule  out subarachnoid hemorrhage EXAM: CT HEAD WITHOUT CONTRAST TECHNIQUE: Contiguous axial images were obtained from the base of the skull through the vertex without intravenous contrast. COMPARISON:  MRI head 03/26/2017 FINDINGS: Brain: Mass lesion left lateral cerebellum measures 36 x 42 mm similar in size to the prior study. This has mixed cystic and solid component. Increased mass-effect on the fourth ventricle which is displaced to the right. No obstructive hydrocephalus. Left parietal convexity hemorrhagic metastatic deposit measures 28 x 22 mm and is increased in size. Surrounding edema has improved. No new mass lesions identified. Generalized atrophy. Chronic infarcts in the left cerebral white matter and left basal ganglia. Negative for acute infarct. Vascular: Negative for hyperdense vessel Skull: Negative Sinuses/Orbits: Air-fluid level right maxillary sinus Other: None IMPRESSION: Metastatic lesions in the left cerebellum and left parietal convexity with interval enlargement since recent studies compatible with progression of tumor. Progressive mass-effect on the fourth ventricle without obstructive hydrocephalus. Improvement in vasogenic edema in the left parietal lobe likely related to interval steroid treatment. Chronic infarct left basal ganglia and deep white matter. No acute infarct. Electronically Signed   By: Franchot Gallo M.D.   On: 04/14/2017 14:28   Ct Head Wo Contrast  Result Date: 03/21/2017 CLINICAL DATA:  Slurred speech EXAM: CT HEAD WITHOUT CONTRAST TECHNIQUE: Contiguous axial images were obtained from the base of the skull through the vertex without intravenous contrast. COMPARISON:  CT 05/21/2015.  MRI to 05/22/2015. FINDINGS: Brain: Abnormal low-density noted within the left cerebellar hemisphere, new since prior studies. There is also new low-density throughout the  deep white matter within the left parietal lobe, surrounding what appears to be a focal lesion in the posterior parietal  lobe measuring 2.3 x 2.3 cm. Findings are concerning for metastases in these 2 areas. Changes of old left MCA infarct in the basal ganglia and periventricular white matter on the left. No hemorrhage or hydrocephalus. Vascular: No hyperdense vessel or unexpected calcification. Skull: No acute calvarial abnormality. Sinuses/Orbits: Visualized paranasal sinuses and mastoids clear. Orbital soft tissues unremarkable. Other: None IMPRESSION: Edema noted within the left parietal lobe around a 2.3 cm mass lesion. There is also edema within the left cerebellar hemisphere, likely also related to an underlying mass lesion. Findings concerning for metastases. These could be further evaluated with MRI with and without contrast. These results were called by telephone at the time of interpretation on 03/21/2017 at 10:52 am to Dr. Carrie Mew , who verbally acknowledged these results. Electronically Signed   By: Rolm Baptise M.D.   On: 03/21/2017 10:53   Nm Pet Image Initial (pi) Skull Base To Thigh  Result Date: 04/03/2017 CLINICAL DATA:  Initial treatment strategy for small cell carcinoma of the lung, metastatic to the brain EXAM: NUCLEAR MEDICINE PET SKULL BASE TO THIGH TECHNIQUE: 13.3 mCi F-18 FDG was injected intravenously. Full-ring PET imaging was performed from the skull base to thigh after the radiotracer. CT data was obtained and used for attenuation correction and anatomic localization. FASTING BLOOD GLUCOSE:  Value: 113 mg/dl COMPARISON:  Multiple exams, including 03/22/2017 FINDINGS: NECK AND HEAD Primarily hypermetabolic mass of the left parietal lobe with surrounding vasogenic edema, maximum SUV 10.4. Cystic and solid mass of the left lateral cerebellum, maximum SUV in the solid elements 8.8. Asymmetric hypoactivity in the left lentiform nuclei due to prior infarcts. The smaller potential lesion in the right frontal lobe is not appreciably hypermetabolic but is likely below sensitive PET-CT size thresholds.  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 No hypermetabolic adenopathy in the neck or other abnormal metabolic activity in the neck. Mild chronic left maxillary sinusitis. CHEST The dominant left lower lobe pulmonary nodule measures 2.4 by 2.1 cm on image 136/3, and has a maximum standard uptake value of 7.5. Bilateral airway thickening especially in the lower lobes. No hypermetabolic or pathologically enlarged adenopathy observed in the chest. Atherosclerotic calcification of the aortic arch and branch vessels. ABDOMEN/PELVIS No abnormal hypermetabolic activity within the liver, pancreas, adrenal glands, or spleen. No hypermetabolic lymph nodes in the abdomen or pelvis. Focal accentuated activity at the level of the anal sphincter without appreciable CT abnormality, maximum SUV 7.1, most likely to be physiologic. Incidental fat density in the descending duodenum compatible with a small lipoma. Aortoiliac atherosclerotic vascular disease. Sigmoid colon diverticulosis. Curvilinear calcification in the prostate gland. No significant abnormal hypermetabolic activity in the vicinity of the 8 by 7 mm pancreatic head hypodense lesion. The 0.8 by 0.6 cm exophytic lesion from the right kidney lower pole is mildly hyperdense precontrast and is probably a complex cyst, but technically too small to characterize. SKELETON Mildly accentuated focal activity in the thoracic spine at the T5-6 level eccentric to the right, maximum SUV 3.8, without appreciable CT correlate. Activity at other vertebral levels is in the 3.4 range, hence this may simply be incidental or due to degenerative findings. IMPRESSION: 1. Hypermetabolic left lower lobe pulmonary nodule, maximum SUV 7.5, compatible with malignancy. 2. Hypermetabolic masses in the left parietal lobe and left cerebellum favoring metastatic lesions. A questionably enhancing lesion in the right frontal lobe is not  appreciably hypermetabolic on PET-CT, but is probably below  sensitive PET-CT size thresholds. 3. No findings of hypermetabolic adenopathy in the chest, or metastatic disease to the abdomen/pelvis. 4. Mildly accentuated focal activity eccentric to the right at the T5-6 level, barely above background activity level, probably due to spondylosis or incidental given that there is no correlate on the CT data. This area may warrant surveillance on follow up imaging. 5. Small hypodense lesion in the pancreatic head is not appreciably hypermetabolic, but may warrant surveillance. 6. Small complex lesion of the right kidney demonstrates hyperdensity on the noncontrast CT data, and is statistically most likely to be a small benign complex cyst, but also warrant surveillance. 7. Other imaging findings of potential clinical significance: Mild chronic left maxillary sinusitis. Aortic Atherosclerosis (ICD10-I70.0). Sigmoid colon diverticulosis. Old left lentiform nucleus infarcts. Electronically Signed   By: Van Clines M.D.   On: 04/03/2017 16:28       Today   Subjective:   Tally Due patient is confused Objective:   Blood pressure 104/73, pulse (!) 101, temperature 97.7 F (36.5 C), temperature source Oral, resp. rate 16, height 5\' 7"  (1.702 m), weight 230 lb (104.3 kg), SpO2 92 %.  .  Intake/Output Summary (Last 24 hours) at 04/18/2017 1701 Last data filed at 04/18/2017 0309 Gross per 24 hour  Intake 60 ml  Output -  Net 60 ml    Exam VITAL SIGNS: Blood pressure 104/73, pulse (!) 101, temperature 97.7 F (36.5 C), temperature source Oral, resp. rate 16, height 5\' 7"  (1.702 m), weight 230 lb (104.3 kg), SpO2 92 %.  GENERAL:  72 y.o.-year-old patient lying in the bed with no acute distress.  EYES: Pupils equal, round, reactive to light and accommodation. No scleral icterus. Extraocular muscles intact.  HEENT: Head atraumatic, normocephalic. Oropharynx and nasopharynx clear.  NECK:  Supple, no jugular venous distention. No thyroid enlargement, no  tenderness.  LUNGS: Normal breath sounds bilaterally, no wheezing, rales,rhonchi or crepitation. No use of accessory muscles of respiration.  CARDIOVASCULAR: S1, S2 normal. No murmurs, rubs, or gallops.  ABDOMEN: Soft, nontender, nondistended. Bowel sounds present. No organomegaly or mass.  EXTREMITIES: No pedal edema, cyanosis, or clubbing.  NEUROLOGIC: Confused not following commands PSYCHIATRIC: The patient is agitated.  SKIN: No obvious rash, lesion, or ulcer.   Data Review     CBC w Diff:  Lab Results  Component Value Date   WBC 11.4 (H) 04/15/2017   HGB 13.6 04/15/2017   HGB 13.0 03/30/2017   HCT 40.8 04/15/2017   HCT 39.9 03/30/2017   PLT 124 (L) 04/15/2017   PLT 234 03/30/2017   LYMPHOPCT 4 04/14/2017   LYMPHOPCT 11.2 (L) 03/30/2017   MONOPCT 5 04/14/2017   MONOPCT 11.4 03/30/2017   EOSPCT 0 04/14/2017   EOSPCT 0.0 03/30/2017   BASOPCT 0 04/14/2017   BASOPCT 0.0 03/30/2017   CMP:  Lab Results  Component Value Date   NA 134 (L) 04/18/2017   NA 138 03/30/2017   K 4.3 04/18/2017   K 4.4 03/30/2017   CL 102 04/18/2017   CO2 25 04/18/2017   CO2 20 (L) 03/30/2017   BUN 43 (H) 04/18/2017   BUN 33.6 (H) 03/30/2017   CREATININE 1.03 04/18/2017   CREATININE 0.9 03/30/2017   PROT 6.3 (L) 03/30/2017   ALBUMIN 3.6 03/30/2017   BILITOT 0.32 03/30/2017   ALKPHOS 65 03/30/2017   AST 17 03/30/2017   ALT 30 03/30/2017  .  Micro Results Recent Results (from the past  240 hour(s))  Urine Culture     Status: None   Collection Time: 04/14/17  8:03 PM  Result Value Ref Range Status   Specimen Description   Final    URINE, RANDOM Performed at [facility], [address], [place], [place] [address]    Special Requests   Final    NONE Performed at [facility], [address] [place], [place] [address]    Culture   Final    NO GROWTH Performed at [facility], [place] [address], [place], [place] [address]    Report Status 04/16/2017 FINAL   Final        Code Status Orders  (From admission, onward)        Start     Ordered   04/15/17 0119  Do not attempt resuscitation (DNR)  Continuous    Question Answer Comment  In the event of cardiac or respiratory ARREST Do not call a "code blue"   In the event of cardiac or respiratory ARREST Do not perform Intubation, CPR, defibrillation or ACLS   In the event of cardiac or respiratory ARREST Use medication by any route, position, wound care, and other measures to relive pain and suffering. May use oxygen, suction and manual treatment of airway obstruction as needed for comfort.      04/15/17 0118    Code Status History    Date Active Date Inactive Code Status Order ID Comments User Context   04/15/2017 00:25 04/15/2017 01:18 Full Code [id number]  Lance Coon, MD Inpatient   05/21/2015 15:07 05/24/2015 20:23 Full Code [id number]  Francesca Oman, DO Inpatient    Advance Directive Documentation     Most Recent Value  Type of Advance Directive  Healthcare Power of Attorney, Living will  Pre-existing out of facility DNR order (yellow form or pink MOST form)  No data  "MOST" Form in Place?  No data          Follow-up Information    [name], MD Follow up in 1 week(s).   Specialty:  Family Medicine Contact information: [place]  [address] [phone]           Discharge Medications   Allergies as of 04/18/2017   No Known Allergies     Medication List    TAKE these medications   acetaminophen 650 MG CR tablet Commonly known as:  TYLENOL Take 650 mg by mouth every 8 (eight) hours as needed for pain.   albuterol 108 (90 Base) MCG/ACT inhaler Commonly known as:  PROVENTIL HFA;VENTOLIN HFA Inhale 1-2 puffs into the lungs every 6 (six) hours as needed for wheezing or shortness of breath.   amLODipine 10 MG tablet Commonly known as:  NORVASC Take 1 tablet (10 mg total) by mouth daily.   aspirin EC 81 MG tablet Take 81 mg  by mouth daily.   atorvastatin 80 MG tablet Commonly known as:  LIPITOR Take 1 tablet (80 mg total) by mouth daily at 6 PM.   dexamethasone 6 MG tablet Commonly known as:  DECADRON Take 6 mg by mouth 2 (two) times daily.   divalproex 500 MG DR tablet Commonly known as:  DEPAKOTE Take 2 tablets (1,000 mg total) by mouth every 12 (twelve) hours.   folic acid 1 MG tablet Commonly known as:  FOLVITE Take 1 mg by mouth daily.   lidocaine-prilocaine cream Commonly known as:  EMLA Apply to affected area once   LORazepam  0.5 MG tablet Commonly known as:  ATIVAN Take 1 tablet (0.5 mg total) by mouth every 6 (six) hours as needed for anxiety. What changed:    how much to take  how to take this  when to take this  reasons to take this  additional instructions   morphine CONCENTRATE 10 MG/0.5ML Soln concentrated solution Take 0.25 mLs (5 mg total) by mouth every 3 (three) hours as needed for moderate pain, severe pain or shortness of breath.   ondansetron 8 MG tablet Commonly known as:  ZOFRAN Take 1 tablet (8 mg total) by mouth 2 (two) times daily as needed for refractory nausea / vomiting.   oxybutynin 5 MG 24 hr tablet Commonly known as:  DITROPAN-XL Take 1 tablet (5 mg total) by mouth at bedtime.   pantoprazole 40 MG tablet Commonly known as:  PROTONIX Take 40 mg by mouth daily.   prochlorperazine 10 MG tablet Commonly known as:  COMPAZINE Take 1 tablet (10 mg total) by mouth every 6 (six) hours as needed (Nausea or vomiting).   QUEtiapine 25 MG tablet Commonly known as:  SEROQUEL Take 1 tablet (25 mg total) by mouth at bedtime.   traZODone 50 MG tablet Commonly known as:  DESYREL Take 50 mg by mouth at bedtime as needed for sleep.          Total Time in preparing paper work, data evaluation and todays exam - 35 minutes  Dustin Flock M.D on 04/18/2017 at 5:01 PM  [facility]   Office  [phone]

## 2017-04-19 ENCOUNTER — Emergency Department
Admission: EM | Admit: 2017-04-19 | Discharge: 2017-04-20 | Disposition: A | Discharge status: Hospice | Attending: Emergency Medicine | Admitting: Emergency Medicine

## 2017-04-19 ENCOUNTER — Ambulatory Visit: Payer: Medicare Other

## 2017-04-19 ENCOUNTER — Other Ambulatory Visit: Payer: Self-pay

## 2017-04-19 ENCOUNTER — Inpatient Hospital Stay: Payer: Medicare Other

## 2017-04-19 ENCOUNTER — Ambulatory Visit: Payer: Medicare Other | Admitting: Family Medicine

## 2017-04-19 DIAGNOSIS — Z87891 Personal history of nicotine dependence: Secondary | ICD-10-CM | POA: Diagnosis not present

## 2017-04-19 DIAGNOSIS — Z7982 Long term (current) use of aspirin: Secondary | ICD-10-CM | POA: Insufficient documentation

## 2017-04-19 DIAGNOSIS — Z79899 Other long term (current) drug therapy: Secondary | ICD-10-CM | POA: Insufficient documentation

## 2017-04-19 DIAGNOSIS — Z515 Encounter for palliative care: Secondary | ICD-10-CM

## 2017-04-19 DIAGNOSIS — F0390 Unspecified dementia without behavioral disturbance: Secondary | ICD-10-CM | POA: Diagnosis not present

## 2017-04-19 DIAGNOSIS — R451 Restlessness and agitation: Secondary | ICD-10-CM | POA: Diagnosis not present

## 2017-04-19 DIAGNOSIS — Z743 Need for continuous supervision: Secondary | ICD-10-CM | POA: Diagnosis not present

## 2017-04-19 DIAGNOSIS — Z8673 Personal history of transient ischemic attack (TIA), and cerebral infarction without residual deficits: Secondary | ICD-10-CM | POA: Diagnosis not present

## 2017-04-19 DIAGNOSIS — C7931 Secondary malignant neoplasm of brain: Secondary | ICD-10-CM | POA: Insufficient documentation

## 2017-04-19 DIAGNOSIS — I10 Essential (primary) hypertension: Secondary | ICD-10-CM | POA: Diagnosis not present

## 2017-04-19 DIAGNOSIS — C799 Secondary malignant neoplasm of unspecified site: Secondary | ICD-10-CM

## 2017-04-19 MED ORDER — TRAZODONE HCL 50 MG PO TABS
50.0000 mg | ORAL_TABLET | Freq: Every evening | ORAL | Status: DC | PRN
  Administered 2017-04-19: 50 mg via ORAL
  Filled 2017-04-19: qty 1

## 2017-04-19 MED ORDER — QUETIAPINE FUMARATE 25 MG PO TABS
25.0000 mg | ORAL_TABLET | Freq: Every day | ORAL | Status: DC
  Administered 2017-04-19: 25 mg via ORAL
  Filled 2017-04-19: qty 1

## 2017-04-19 MED ORDER — HALOPERIDOL LACTATE 5 MG/ML IJ SOLN
5.0000 mg | Freq: Once | INTRAMUSCULAR | Status: AC
  Administered 2017-04-19: 5 mg via INTRAMUSCULAR
  Filled 2017-04-19: qty 1

## 2017-04-19 NOTE — ED Provider Notes (Signed)
[facility] Emergency Department Provider Note  ____________________________________________  Time seen: Approximately 9:51 PM  I have reviewed the triage vital signs and the nursing notes.   HISTORY  Chief Complaint Agitation  Level 5 Caveat: Portions of the History and Physical are unable to be obtained due to patient being a poor historian   HPI Jonathan Bowen is a 72 y.o. 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 They note that he's been trying to hit his head on the wall and on the table. He fell earlier sustaining an abrasion to his face.Hospice was at the home today evaluating the patient, trying Ativan, but were unable to control his symptoms. They would've wanted to put him directly into the hospice home, but there is no bed availability so is brought to the ED for symptom management in the meantime. No other acute complaints other than agitation.  EMS reported the patient has been calm throughout their transport. Patient is calm on arrival. He states that his family was making him angry    Past Medical History:  Diagnosis Date  . Agitation   . Complication of anesthesia    has never had surgery  . Dementia 2018  . Hyperlipidemia   . Hypertension   . 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 Smoker   . Stroke [facility])    just happened in early February  . Urinary incontinence      Patient Active Problem List   Diagnosis Date Noted  . Accelerated hypertension 04/14/2017  . Headache 04/14/2017  . Seizure ([place]) 04/14/2017  . Overactive bladder 04/06/2017  . Agitation 04/06/2017  . Insomnia 04/06/2017  . Small cell lung cancer ([place]) 03/30/2017  . Goals of care, counseling/discussion 03/30/2017  . History of CVA (cerebrovascular accident) 06/29/2015  . Carotid artery  stenosis 06/18/2015  . Hyperlipidemia   . Essential hypertension      Past Surgical History:  Procedure Laterality Date  . ENDARTERECTOMY Right 06/18/2015   Procedure: ENDARTERECTOMY RIGHT CAROTID;  Surgeon: Rosetta Posner, MD;  Location: [facility];  Service: Vascular;  Laterality: Right;  . PATCH ANGIOPLASTY Right 06/18/2015   Procedure: PATCH ANGIOPLASTY RIGHT CAROTID ARTERY;  Surgeon: Rosetta Posner, MD;  Location: [facility];  Service: Vascular;  Laterality: Right;  . PORTA CATH INSERTION N/A 04/13/2017   Procedure: PORTA CATH INSERTION;  Surgeon: Katha Cabal, MD;  Location: [facility];  Service: Cardiovascular;  Laterality: N/A;     Prior to Admission medications   Medication Sig Start Date End Date Taking? Authorizing Provider  acetaminophen (TYLENOL) 650 MG CR tablet Take 650 mg by mouth every 8 (eight) hours as needed for pain.    [provider]  albuterol (PROVENTIL HFA;VENTOLIN HFA) 108 (90 Base) MCG/ACT inhaler Inhale 1-2 puffs into the lungs every 6 (six) hours as needed for wheezing or shortness of breath. 04/06/17   McLean-Scocuzza, Nino Glow, MD  amLODipine (NORVASC) 10 MG tablet Take 1 tablet (10 mg total) by mouth daily. 04/07/16   Coral Spikes, DO  aspirin EC 81 MG tablet Take 81 mg by mouth daily.    [provider]  atorvastatin (LIPITOR) 80 MG tablet Take 1 tablet (80 mg total) by mouth daily at 6 PM. 04/07/16   Thersa Salt  G, DO  dexamethasone (DECADRON) 6 MG tablet Take 6 mg by mouth 2 (two) times daily.    [provider]  divalproex (DEPAKOTE) 500 MG DR tablet Take 2 tablets (1,000 mg total) by mouth every 12 (twelve) hours. 04/18/17   Dustin Flock, MD  folic acid (FOLVITE) 1 MG tablet Take 1 mg by mouth daily.    [provider]  lidocaine-prilocaine (EMLA) cream Apply to affected area once 04/08/17   Lloyd Huger, MD  LORazepam (ATIVAN) 0.5 MG tablet Take 1 tablet (0.5 mg total) by mouth every 6 (six) hours as needed  for anxiety. 04/18/17   Dustin Flock, MD  Morphine Sulfate (MORPHINE CONCENTRATE) 10 MG/0.5ML SOLN concentrated solution Take 0.25 mLs (5 mg total) by mouth every 3 (three) hours as needed for moderate pain, severe pain or shortness of breath. 04/18/17   Dustin Flock, MD  ondansetron (ZOFRAN) 8 MG tablet Take 1 tablet (8 mg total) by mouth 2 (two) times daily as needed for refractory nausea / vomiting. Patient not taking: Reported on 04/13/2017 04/08/17   Lloyd Huger, MD  oxybutynin (DITROPAN-XL) 5 MG 24 hr tablet Take 1 tablet (5 mg total) by mouth at bedtime. 04/06/17   McLean-Scocuzza, Nino Glow, MD  pantoprazole (PROTONIX) 40 MG tablet Take 40 mg by mouth daily.    [provider]  prochlorperazine (COMPAZINE) 10 MG tablet Take 1 tablet (10 mg total) by mouth every 6 (six) hours as needed (Nausea or vomiting). Patient not taking: Reported on 04/13/2017 04/08/17   Lloyd Huger, MD  QUEtiapine (SEROQUEL) 25 MG tablet Take 1 tablet (25 mg total) by mouth at bedtime. 04/18/17   Dustin Flock, MD  traZODone (DESYREL) 50 MG tablet Take 50 mg by mouth at bedtime as needed for sleep.     [provider]     Allergies Patient has no known allergies.   Family History  Problem Relation Age of Onset  . Diabetes Mother   . Cancer Father        Unknown of the type of cancer   . Diabetes Sister   . ALS Brother     Social History Social History   Tobacco Use  . Smoking status: Former Smoker    Packs/day: 3.00    Years: 50.00    Pack years: 150.00    Last attempt to quit: 05/28/2015    Years since quitting: 1.8  . Smokeless tobacco: Never Used  Substance Use Topics  . Alcohol use: No  . Drug use: No    Review of Systems  Constitutional:   No fever or chills.  ENT:   No sore throat. No rhinorrhea. Cardiovascular:   No chest pain or syncope. Respiratory:   No dyspnea or cough. Gastrointestinal:   Negative for abdominal pain, vomiting and diarrhea.   Musculoskeletal:   Negative for focal pain or swelling All other systems reviewed and are negative except as documented above in ROS and HPI.  ____________________________________________   PHYSICAL EXAM:  VITAL SIGNS: ED Triage Vitals  Enc Vitals Group     BP 04/19/17 1917 (!) 144/83     Pulse Rate 04/19/17 1917 90     Resp 04/19/17 1917 20     Temp 04/19/17 1917 (!) 97.4 F (36.3 C)     Temp Source 04/19/17 1917 Oral     SpO2 04/19/17 1917 92 %     Weight 04/19/17 1918 230 lb (104.3 kg)     Height 04/19/17 1918 5'  8" (1.727 m)     Head Circumference --      Peak Flow --      Pain Score 04/19/17 1922 0     Pain Loc --      Pain Edu? --      Excl. in [place]? --     Vital signs reviewed, nursing assessments reviewed.   Constitutional:   Awake and alert. Well appearing and in no distress. Eyes:   No scleral icterus.  EOMI. No nystagmus. No conjunctival pallor. PERRL. ENT   Head:   Normocephalic with abrasion over the bridge of the nose. No lacerations. No hematoma or ecchymosis. No bony point tenderness or step off or crepitus..   Nose:   No congestion/rhinnorhea. No epistaxis   Mouth/Throat:   Dry mucous membranes, no pharyngeal erythema. No peritonsillar mass. No intraoral injuries.   Neck:   No meningismus. Full ROM. No midline spinal tenderness Hematological/Lymphatic/Immunilogical:   No cervical lymphadenopathy. Cardiovascular:   RRR. Symmetric bilateral radial and DP pulses.  No murmurs.  Respiratory:   Normal respiratory effort without tachypnea/retractions. Breath sounds are clear and equal bilaterally. No wheezes/rales/rhonchi. Gastrointestinal:   Soft and nontender. Non distended. There is no CVA tenderness.  No rebound, rigidity, or guarding. Genitourinary:   deferred Musculoskeletal:   Normal range of motion in all extremities. No joint effusions.  No lower extremity tenderness.  No edema. Neurologic:   Normal speech .  Motor grossly intact. No acute  focal neurologic deficits are appreciated.  Skin:    Skin is warm, dry and intact. No rash noted.  No petechiae, purpura, or bullae.  ____________________________________________    LABS (pertinent positives/negatives) (all labs ordered are listed, but only abnormal results are displayed) Labs Reviewed - No data to display ____________________________________________   EKG    ____________________________________________    RADIOLOGY  No results found.  ____________________________________________   PROCEDURES Procedures  ____________________________________________    CLINICAL IMPRESSION / ASSESSMENT AND PLAN / ED COURSE  Pertinent labs & imaging results that were available during my care of the patient were reviewed by me and considered in my medical decision making (see chart for details).   Patient presents calm and cooperative, unremarkable exam, unremarkable vital signs. Low suspicion for serious traumatic injury at this time. No acute medical needs, appears to be medically stable. We'll reach out to hospice for coordination of care.  Clinical Course as of Apr 19 2149  Thu Apr 19, 2017  2134 D/w hospice nurse Napoleon Form. Pt is on list for hospice home, but currently no bed available. No acute medical needs requiring hospitalization. Will observe in ED until morning to see if hospice bed becomes available, otherwise may need to further explore discharge planning.   [PS]    Clinical Course User Index [PS] Carrie Mew, MD     ____________________________________________   FINAL CLINICAL IMPRESSION(S) / ED DIAGNOSES    Final diagnoses:  Agitation  Metastatic cancer [facility])  Hospice care patient       Portions of this note were generated with dragon dictation software. Dictation errors may occur despite best attempts at proofreading.    Carrie Mew, MD 04/19/17 2156

## 2017-04-19 NOTE — ED Triage Notes (Signed)
Pt arrives [facility] from home for agitation. Pt is a hospice pt per EMS with stage 4 brain CA. EMS reports that family called d/t agitation and family wants pt to be placed at hospice. Pt is resting in bed, no distress noted. abrasion noted to forehead and bridge of nose.

## 2017-04-19 NOTE — ED Notes (Signed)
Placed pt medications in applesauce. Pt ate 1.5 cups of applesauce.

## 2017-04-19 NOTE — ED Notes (Signed)
Pt resting on stretcher. Side rails raised. Lights dimmed. Head slightly raised and knees raised as well. Door to remain open to continue to monitor pt.

## 2017-04-19 NOTE — ED Notes (Signed)
Pt given applesauce, graham crackers, peanut butter because he was yelling he was hungry.

## 2017-04-19 NOTE — ED Notes (Signed)
Per Dr. Joni Fears no EKG or blood work to be done at this time.

## 2017-04-19 NOTE — ED Notes (Signed)
Pt provided pillow.

## 2017-04-20 ENCOUNTER — Ambulatory Visit: Payer: Medicare Other

## 2017-04-20 ENCOUNTER — Inpatient Hospital Stay: Payer: Medicare Other

## 2017-04-20 MED ORDER — LORAZEPAM 2 MG PO TABS
2.0000 mg | ORAL_TABLET | Freq: Once | ORAL | Status: AC
  Administered 2017-04-20: 2 mg via ORAL
  Filled 2017-04-20: qty 1

## 2017-04-20 NOTE — ED Notes (Signed)
Pt repositioned in bed.

## 2017-04-20 NOTE — ED Notes (Addendum)
Called pt's daughter Corney Knighton and left message stating pt is going to [facility] in [place] instead of morning. Left number and name of contact person at [facility].

## 2017-04-20 NOTE — ED Notes (Signed)
Pt unable to sign for discharge, pt's daughter called and informed of pt leaving to head to [facility].

## 2017-04-20 NOTE — ED Notes (Signed)
Pt dressed in hospital gown and diaper put on

## 2017-04-20 NOTE — ED Provider Notes (Signed)
The patient has a hospice bed available.  He is discharged in improved condition.   Darel Hong, MD 04/20/17 0005

## 2017-04-20 NOTE — ED Notes (Signed)
Pt given applesauce per previous RN Anda Kraft. This RN administered water to pt with no difficulty

## 2017-04-20 NOTE — ED Notes (Signed)
Pt given applesauce and water to drink

## 2017-04-20 NOTE — Discharge Instructions (Signed)
Follow up as scheduled.  Return to the ED as needed.

## 2017-04-23 ENCOUNTER — Institutional Professional Consult (permissible substitution): Payer: Medicare Other | Admitting: Radiation Oncology

## 2017-04-23 ENCOUNTER — Ambulatory Visit: Payer: Medicare Other

## 2017-04-24 ENCOUNTER — Ambulatory Visit: Payer: Medicare Other

## 2017-04-25 ENCOUNTER — Telehealth: Payer: Self-pay | Admitting: *Deleted

## 2017-04-25 ENCOUNTER — Ambulatory Visit: Payer: Medicare Other

## 2017-04-26 ENCOUNTER — Ambulatory Visit: Payer: Medicare Other

## 2017-04-27 ENCOUNTER — Ambulatory Visit: Payer: Medicare Other | Admitting: Internal Medicine

## 2017-04-27 ENCOUNTER — Ambulatory Visit: Payer: Medicare Other

## 2017-04-30 ENCOUNTER — Ambulatory Visit: Payer: Medicare Other

## 2017-05-01 ENCOUNTER — Ambulatory Visit: Payer: Medicare Other

## 2017-05-02 ENCOUNTER — Ambulatory Visit: Payer: Medicare Other

## 2017-05-04 ENCOUNTER — Inpatient Hospital Stay: Payer: Medicare Other | Admitting: Family Medicine

## 2017-05-05 ENCOUNTER — Other Ambulatory Visit: Payer: Self-pay | Admitting: Nurse Practitioner

## 2017-05-18 NOTE — Telephone Encounter (Signed)
Hospice home called to report that patient expired at 12:45 PM today

## 2017-05-18 DEATH — deceased

## 2017-05-21 ENCOUNTER — Ambulatory Visit: Payer: Medicare Other | Admitting: Family Medicine

## 2018-09-21 IMAGING — CT CT HEAD W/O CM
4 of 5 series · 15 of 47 positions shown, 17 images · non-contrast
Comparison: MRI head 03/26/2017

CLINICAL DATA: Headache. Metastatic lung cancer. Rule out
subarachnoid hemorrhage

EXAM:
CT HEAD WITHOUT CONTRAST
TECHNIQUE: Contiguous axial images were obtained from the base of the skull
through the vertex without intravenous contrast.

[Series 2: head wo · axial · 0.45mm/px · z∈[-60,+45]mm · 8 of 29 slices shown, 10 images]
[im 4/29  brain]
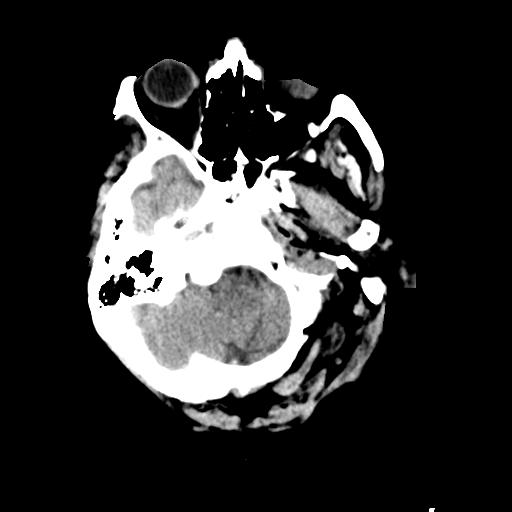
[im 4/29  bone]
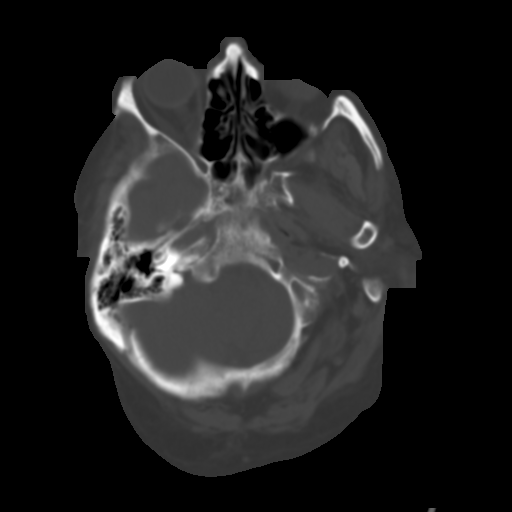
[im 7/29  brain]
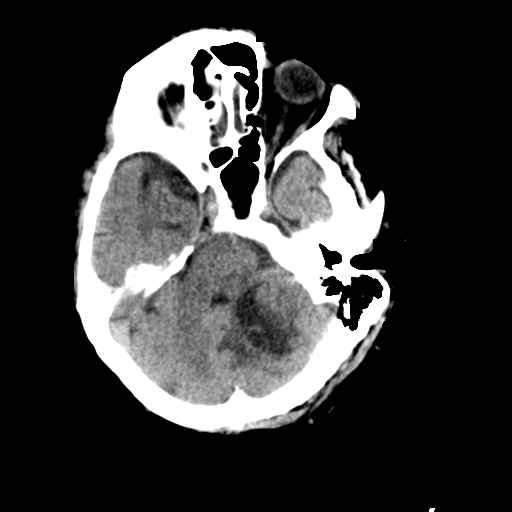
[im 10/29  brain]
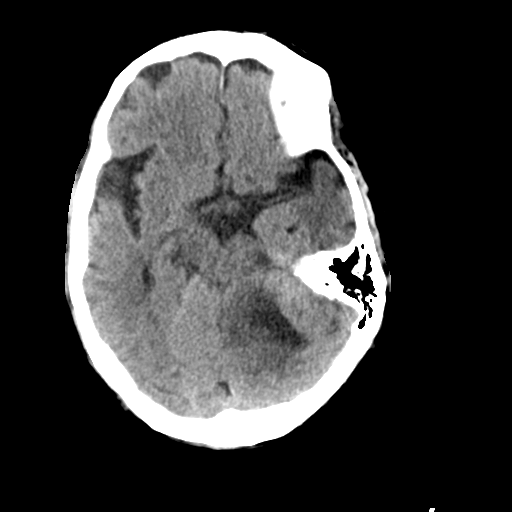
[im 13/29  brain]
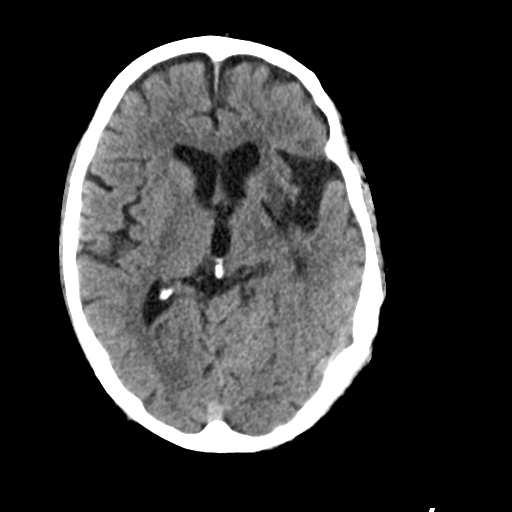
[im 16/29  brain]
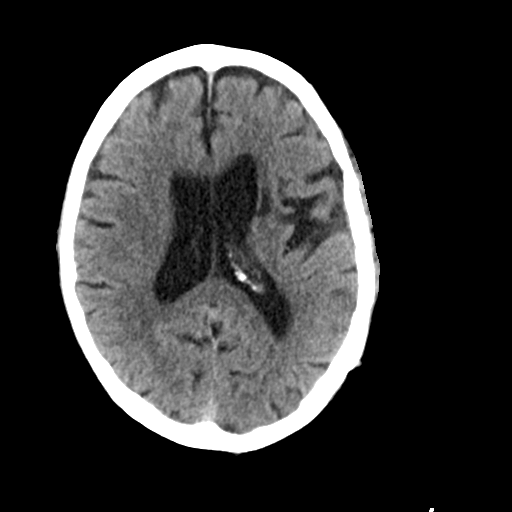
[im 16/29  bone]
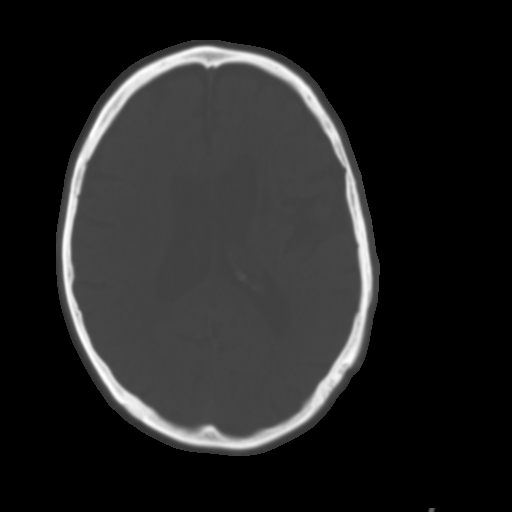
[im 19/29  brain]
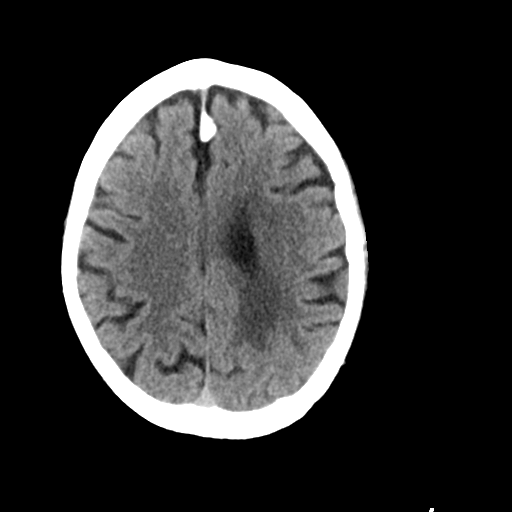
[im 22/29  brain]
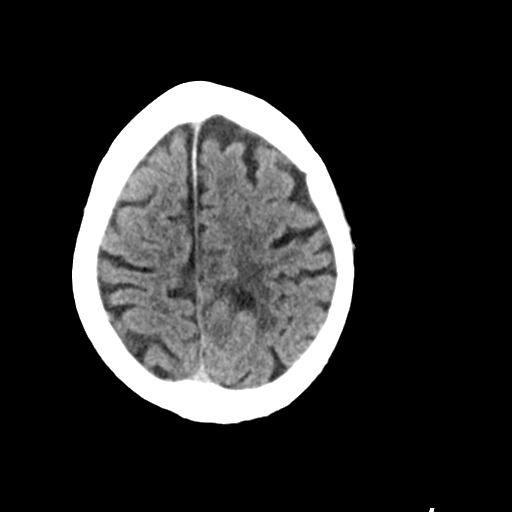
[im 25/29  brain]
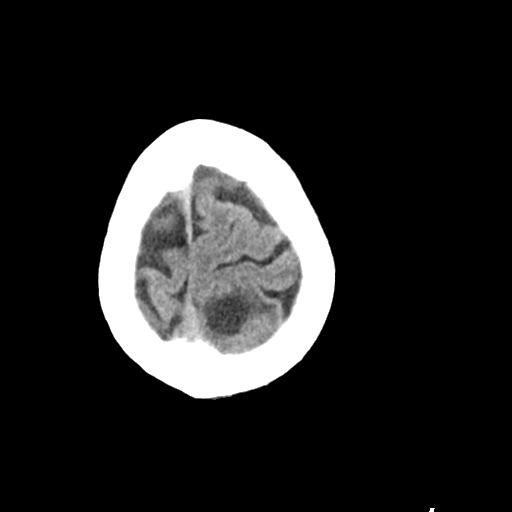

[Series 4: ax head wo recon · axial · 0.31mm/px · 1 of 30 slices shown]
[im 4/30  brain]
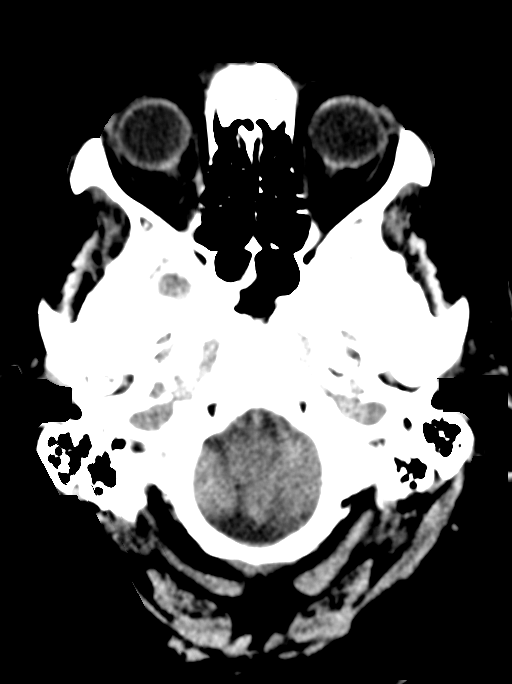

[Series 6: coronal soft tissue · coronal · 0.30mm/px · 3 of 67 slices shown]
[im 23/67  brain]
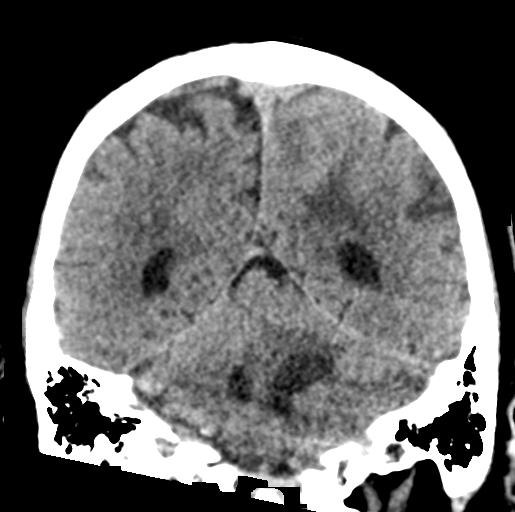
[im 30/67  brain]
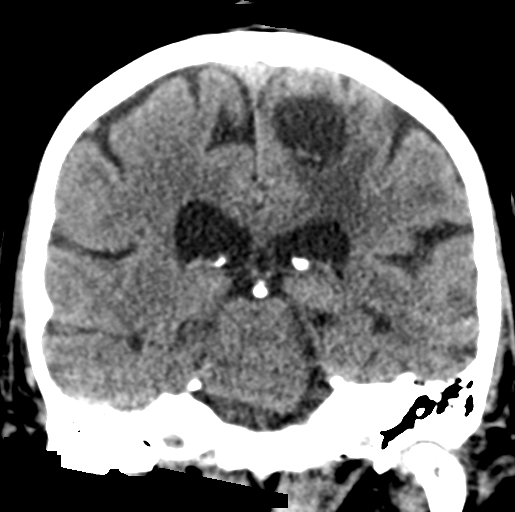
[im 37/67  brain]
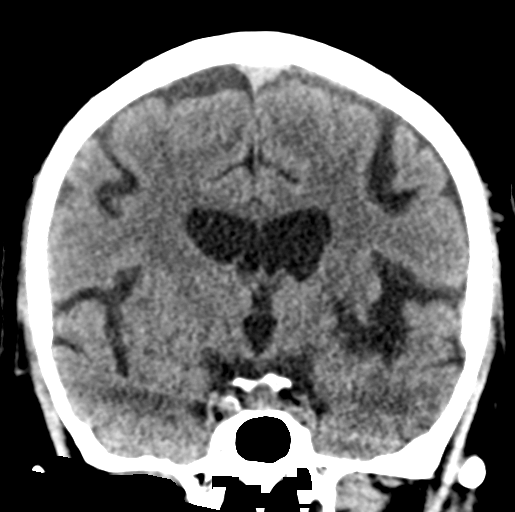

[Series 7: sagittal soft tissue · sagittal · 0.30mm/px · 3 of 50 slices shown]
[im 20/50  brain]
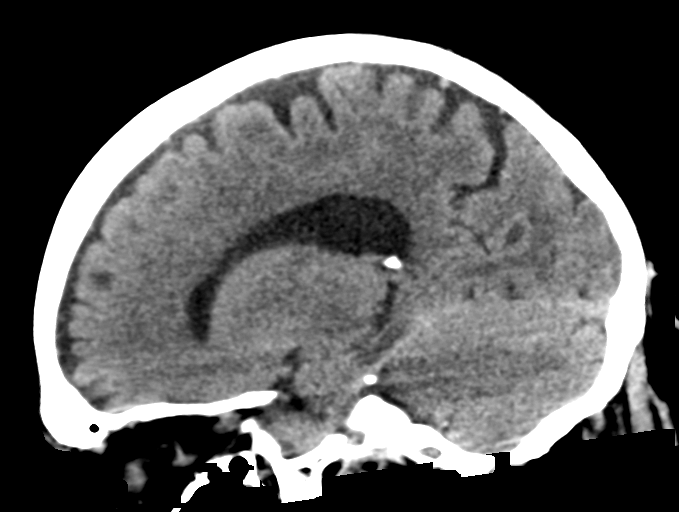
[im 25/50  brain]
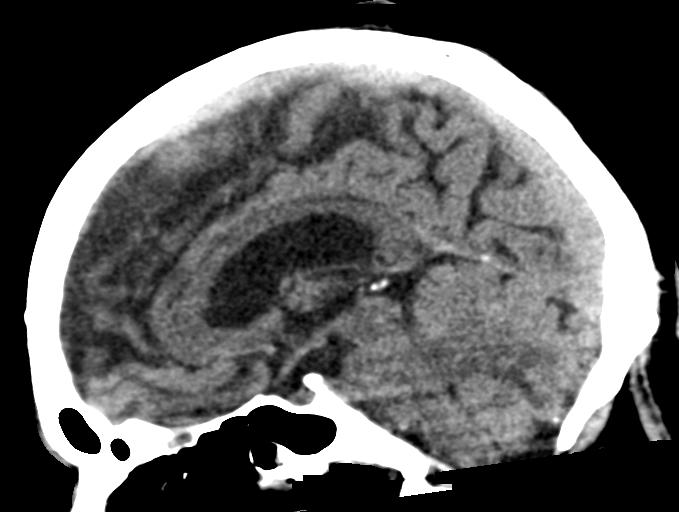
[im 30/50  brain]
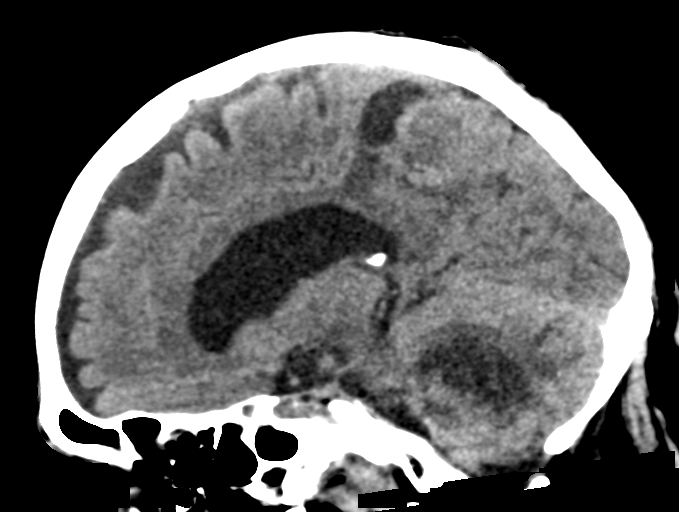

[15 of 47 positions shown; findings below may reference images not displayed]

FINDINGS: Brain: Mass lesion left lateral cerebellum measures 36 x 42 mm
similar in size to the prior study. This has mixed cystic and solid
component. Increased mass-effect on the fourth ventricle which is
displaced to the right. No obstructive hydrocephalus.

Left parietal convexity hemorrhagic metastatic deposit measures 28 x
22 mm and is increased in size. Surrounding edema has improved.

No new mass lesions identified. Generalized atrophy. Chronic
infarcts in the left cerebral white matter and left basal ganglia.
Negative for acute infarct.

Vascular: Negative for hyperdense vessel

Skull: Negative

Sinuses/Orbits: Air-fluid level right maxillary sinus

Other: None
IMPRESSION: Metastatic lesions in the left cerebellum and left parietal
convexity with interval enlargement since recent studies compatible
with progression of tumor. Progressive mass-effect on the fourth
ventricle without obstructive hydrocephalus. Improvement in
vasogenic edema in the left parietal lobe likely related to interval
steroid treatment.

Chronic infarct left basal ganglia and deep white matter. No acute
infarct.
